# Patient Record
Sex: Male | Born: 1960 | Race: White | Hispanic: No | Marital: Married | Smoking: Never smoker
Health system: Southern US, Community
[De-identification: ages and names within clinical notes are randomized; demographics above are authoritative.]

## PROBLEM LIST (undated history)

## (undated) DIAGNOSIS — K219 Gastro-esophageal reflux disease without esophagitis: Secondary | ICD-10-CM

## (undated) DIAGNOSIS — H53021 Refractive amblyopia, right eye: Secondary | ICD-10-CM

## (undated) DIAGNOSIS — E039 Hypothyroidism, unspecified: Secondary | ICD-10-CM

## (undated) DIAGNOSIS — G629 Polyneuropathy, unspecified: Secondary | ICD-10-CM

## (undated) DIAGNOSIS — I10 Essential (primary) hypertension: Secondary | ICD-10-CM

## (undated) DIAGNOSIS — M797 Fibromyalgia: Secondary | ICD-10-CM

## (undated) DIAGNOSIS — E669 Obesity, unspecified: Secondary | ICD-10-CM

## (undated) DIAGNOSIS — F419 Anxiety disorder, unspecified: Secondary | ICD-10-CM

## (undated) DIAGNOSIS — M1990 Unspecified osteoarthritis, unspecified site: Secondary | ICD-10-CM

## (undated) DIAGNOSIS — E119 Type 2 diabetes mellitus without complications: Secondary | ICD-10-CM

## (undated) DIAGNOSIS — K222 Esophageal obstruction: Secondary | ICD-10-CM

## (undated) DIAGNOSIS — N400 Enlarged prostate without lower urinary tract symptoms: Secondary | ICD-10-CM

## (undated) DIAGNOSIS — F32A Depression, unspecified: Secondary | ICD-10-CM

## (undated) DIAGNOSIS — G4733 Obstructive sleep apnea (adult) (pediatric): Secondary | ICD-10-CM

## (undated) DIAGNOSIS — E1140 Type 2 diabetes mellitus with diabetic neuropathy, unspecified: Secondary | ICD-10-CM

## (undated) DIAGNOSIS — E11319 Type 2 diabetes mellitus with unspecified diabetic retinopathy without macular edema: Secondary | ICD-10-CM

## (undated) DIAGNOSIS — R251 Tremor, unspecified: Secondary | ICD-10-CM

## (undated) DIAGNOSIS — Z87442 Personal history of urinary calculi: Secondary | ICD-10-CM

## (undated) DIAGNOSIS — H2513 Age-related nuclear cataract, bilateral: Secondary | ICD-10-CM

## (undated) DIAGNOSIS — C439 Malignant melanoma of skin, unspecified: Secondary | ICD-10-CM

## (undated) DIAGNOSIS — E785 Hyperlipidemia, unspecified: Secondary | ICD-10-CM

## (undated) HISTORY — DX: Gastro-esophageal reflux disease without esophagitis: K21.9

## (undated) HISTORY — DX: Malignant melanoma of skin, unspecified: C43.9

## (undated) HISTORY — DX: Type 2 diabetes mellitus with unspecified diabetic retinopathy without macular edema: E11.319

## (undated) HISTORY — PX: TRIGGER FINGER RELEASE: SHX641

## (undated) HISTORY — DX: Essential (primary) hypertension: I10

## (undated) HISTORY — DX: Obstructive sleep apnea (adult) (pediatric): G47.33

## (undated) HISTORY — DX: Hyperlipidemia, unspecified: E78.5

## (undated) HISTORY — DX: Type 2 diabetes mellitus without complications: E11.9

## (undated) HISTORY — PX: ROTATOR CUFF REPAIR: SHX139

## (undated) HISTORY — DX: Obesity, unspecified: E66.9

## (undated) HISTORY — DX: Benign prostatic hyperplasia without lower urinary tract symptoms: N40.0

## (undated) HISTORY — DX: Type 2 diabetes mellitus with diabetic neuropathy, unspecified: E11.40

## (undated) HISTORY — DX: Esophageal obstruction: K22.2

## (undated) HISTORY — DX: Refractive amblyopia, right eye: H53.021

## (undated) HISTORY — DX: Hypothyroidism, unspecified: E03.9

## (undated) HISTORY — DX: Age-related nuclear cataract, bilateral: H25.13

## (undated) HISTORY — DX: Polyneuropathy, unspecified: G62.9

## (undated) HISTORY — DX: Tremor, unspecified: R25.1

---

## 1998-11-13 ENCOUNTER — Ambulatory Visit (HOSPITAL_BASED_OUTPATIENT_CLINIC_OR_DEPARTMENT_OTHER): Admission: RE | Admit: 1998-11-13 | Discharge: 1998-11-13 | Payer: Self-pay | Admitting: Orthopedic Surgery

## 1999-08-29 ENCOUNTER — Ambulatory Visit (HOSPITAL_COMMUNITY): Admission: RE | Admit: 1999-08-29 | Discharge: 1999-08-29 | Payer: Self-pay | Admitting: Neurosurgery

## 1999-08-29 ENCOUNTER — Encounter: Payer: Self-pay | Admitting: Neurosurgery

## 2000-12-26 HISTORY — PX: CARPAL TUNNEL RELEASE: SHX101

## 2000-12-26 HISTORY — PX: CYST EXCISION: SHX5701

## 2001-01-04 ENCOUNTER — Ambulatory Visit (HOSPITAL_BASED_OUTPATIENT_CLINIC_OR_DEPARTMENT_OTHER): Admission: RE | Admit: 2001-01-04 | Discharge: 2001-01-04 | Payer: Self-pay | Admitting: Orthopedic Surgery

## 2001-11-27 ENCOUNTER — Ambulatory Visit (HOSPITAL_BASED_OUTPATIENT_CLINIC_OR_DEPARTMENT_OTHER): Admission: RE | Admit: 2001-11-27 | Discharge: 2001-11-27 | Payer: Self-pay | Admitting: Orthopedic Surgery

## 2001-11-27 ENCOUNTER — Encounter (INDEPENDENT_AMBULATORY_CARE_PROVIDER_SITE_OTHER): Payer: Self-pay | Admitting: Specialist

## 2003-12-27 HISTORY — PX: CERVICAL DISC SURGERY: SHX588

## 2004-03-02 ENCOUNTER — Inpatient Hospital Stay (HOSPITAL_COMMUNITY): Admission: RE | Admit: 2004-03-02 | Discharge: 2004-03-03 | Payer: Self-pay | Admitting: Neurosurgery

## 2005-12-26 HISTORY — PX: CYST EXCISION: SHX5701

## 2006-11-23 ENCOUNTER — Ambulatory Visit (HOSPITAL_BASED_OUTPATIENT_CLINIC_OR_DEPARTMENT_OTHER): Admission: RE | Admit: 2006-11-23 | Discharge: 2006-11-23 | Payer: Self-pay | Admitting: General Surgery

## 2006-11-23 ENCOUNTER — Encounter (INDEPENDENT_AMBULATORY_CARE_PROVIDER_SITE_OTHER): Payer: Self-pay | Admitting: Specialist

## 2008-05-01 ENCOUNTER — Ambulatory Visit: Payer: Self-pay | Admitting: Internal Medicine

## 2008-05-09 ENCOUNTER — Encounter: Payer: Self-pay | Admitting: Internal Medicine

## 2008-05-09 ENCOUNTER — Ambulatory Visit: Payer: Self-pay

## 2008-05-15 DIAGNOSIS — E785 Hyperlipidemia, unspecified: Secondary | ICD-10-CM | POA: Insufficient documentation

## 2008-05-15 DIAGNOSIS — F411 Generalized anxiety disorder: Secondary | ICD-10-CM | POA: Insufficient documentation

## 2008-05-15 DIAGNOSIS — K219 Gastro-esophageal reflux disease without esophagitis: Secondary | ICD-10-CM | POA: Insufficient documentation

## 2008-05-15 DIAGNOSIS — I10 Essential (primary) hypertension: Secondary | ICD-10-CM | POA: Insufficient documentation

## 2008-05-15 DIAGNOSIS — E109 Type 1 diabetes mellitus without complications: Secondary | ICD-10-CM | POA: Insufficient documentation

## 2008-05-15 DIAGNOSIS — E669 Obesity, unspecified: Secondary | ICD-10-CM

## 2008-05-15 DIAGNOSIS — F329 Major depressive disorder, single episode, unspecified: Secondary | ICD-10-CM

## 2008-05-15 DIAGNOSIS — F32A Depression, unspecified: Secondary | ICD-10-CM | POA: Insufficient documentation

## 2008-05-16 ENCOUNTER — Ambulatory Visit: Payer: Self-pay | Admitting: Pulmonary Disease

## 2008-05-16 DIAGNOSIS — G4733 Obstructive sleep apnea (adult) (pediatric): Secondary | ICD-10-CM

## 2008-05-21 ENCOUNTER — Ambulatory Visit: Payer: Self-pay | Admitting: Internal Medicine

## 2008-05-26 ENCOUNTER — Ambulatory Visit (HOSPITAL_BASED_OUTPATIENT_CLINIC_OR_DEPARTMENT_OTHER): Admission: RE | Admit: 2008-05-26 | Discharge: 2008-05-26 | Payer: Self-pay | Admitting: Pulmonary Disease

## 2008-05-26 ENCOUNTER — Encounter: Payer: Self-pay | Admitting: Pulmonary Disease

## 2008-06-16 ENCOUNTER — Ambulatory Visit: Payer: Self-pay | Admitting: Pulmonary Disease

## 2008-06-23 ENCOUNTER — Telehealth (INDEPENDENT_AMBULATORY_CARE_PROVIDER_SITE_OTHER): Payer: Self-pay | Admitting: *Deleted

## 2008-11-07 ENCOUNTER — Ambulatory Visit: Payer: Self-pay | Admitting: Occupational Medicine

## 2009-12-14 ENCOUNTER — Ambulatory Visit: Payer: Self-pay | Admitting: Pulmonary Disease

## 2010-03-03 ENCOUNTER — Telehealth: Payer: Self-pay | Admitting: Pulmonary Disease

## 2010-03-08 ENCOUNTER — Encounter: Payer: Self-pay | Admitting: Pulmonary Disease

## 2010-08-16 ENCOUNTER — Telehealth (INDEPENDENT_AMBULATORY_CARE_PROVIDER_SITE_OTHER): Payer: Self-pay | Admitting: *Deleted

## 2010-08-23 ENCOUNTER — Telehealth (INDEPENDENT_AMBULATORY_CARE_PROVIDER_SITE_OTHER): Payer: Self-pay | Admitting: *Deleted

## 2010-12-14 ENCOUNTER — Ambulatory Visit: Payer: Self-pay | Admitting: Thoracic Surgery

## 2010-12-15 ENCOUNTER — Ambulatory Visit: Payer: Self-pay | Admitting: Pulmonary Disease

## 2010-12-17 ENCOUNTER — Ambulatory Visit (HOSPITAL_COMMUNITY)
Admission: RE | Admit: 2010-12-17 | Discharge: 2010-12-17 | Payer: Self-pay | Source: Home / Self Care | Attending: Thoracic Surgery | Admitting: Thoracic Surgery

## 2010-12-17 ENCOUNTER — Encounter: Payer: Self-pay | Admitting: Pulmonary Disease

## 2010-12-23 ENCOUNTER — Ambulatory Visit: Payer: Self-pay | Admitting: Thoracic Surgery

## 2010-12-30 DIAGNOSIS — D869 Sarcoidosis, unspecified: Secondary | ICD-10-CM | POA: Insufficient documentation

## 2010-12-30 DIAGNOSIS — R05 Cough: Secondary | ICD-10-CM | POA: Insufficient documentation

## 2011-01-06 ENCOUNTER — Ambulatory Visit
Admission: RE | Admit: 2011-01-06 | Discharge: 2011-01-06 | Payer: Self-pay | Source: Home / Self Care | Attending: Pulmonary Disease | Admitting: Pulmonary Disease

## 2011-01-06 ENCOUNTER — Encounter: Payer: Self-pay | Admitting: Pulmonary Disease

## 2011-01-12 ENCOUNTER — Ambulatory Visit
Admission: RE | Admit: 2011-01-12 | Discharge: 2011-01-12 | Payer: Self-pay | Source: Home / Self Care | Attending: Pulmonary Disease | Admitting: Pulmonary Disease

## 2011-01-16 ENCOUNTER — Encounter: Payer: Self-pay | Admitting: Neurosurgery

## 2011-01-25 NOTE — Progress Notes (Signed)
  Phone Note Other Incoming   Request: Send information Summary of Call: Request for records received from [facility]. Request forwarded to [employer].     

## 2011-01-25 NOTE — Letter (Signed)
Summary: [facility] [employer] [facility] Pul[facility]  [address]   [place], [place] [address]   Phone: [phone]  Fax: [phone]    03/08/2010  [facility] [address] [place], [place]  [address]  Dear Mr. Staley,    We have been attempting to contact you. Please call our office at your earliest convenience. Thank you.        Sincerely,   Marcelyn Bruins MD

## 2011-01-25 NOTE — Progress Notes (Signed)
Summary: unable to obtain CPAP Download  Phone Note Other Incoming Call back at [phone]   Caller: DeVona Tart Summary of Call: [facility]  would like Dr. Shelle Iron to know that after all the phone calls and letters sent to the patient Willie [name]  DOB  March 14, 1961  thsy still have not been able to obtain a download of pt's CPAP machine.  Initial call taken by: Denna Haggard, CMA,  March 03, 2010 8:57 AM  Follow-up for Phone Call        LMOVMTCB. Michel Bickers [facility]  March 03, 2010 9:16 AM LMTCB with pt.  Carron Curie CMA  March 04, 2010 12:27 PM LMTCBx3.Carron Curie CMA  March 05, 2010 2:24 PM  FYI we have also attempted to call pt several times. I will sedn a letter advising pt so contact our office as well. Carron Curie CMA  March 08, 2010 9:54 AM   Additional Follow-up for Phone Call Additional follow up Details #1::        send a letter asking him to call dme to get download. Additional Follow-up by: Barbaraann Share MD,  March 08, 2010 4:48 PM    Additional Follow-up for Phone Call Additional follow up Details #2::    letter already mailed asking pt to contact office, will notify needs to contact DME for download once he calls the office. Carron Curie CMA  March 08, 2010 4:50 PM

## 2011-01-27 NOTE — Miscellaneous (Signed)
Summary: Orders Update pft charges  Clinical Lists Changes  Orders: Added new Service order of Carbon Monoxide diffusing w/capacity [phone]) - Signed Added new Service order of Lung Volumes ([address]) - Signed Added new Service order of Spirometry (Pre & Post) [phone]) - Signed  Appended Document: Orders Update pft charges pt needs ov with me to review breathing studies.  Appended Document: Orders Update pft charges called and spoke with pt.  pt scheduled to see KC 01-12-2011 at 1:30 pm

## 2011-01-27 NOTE — Op Note (Signed)
Summary: Bronchoscopy/[place]  Bronchoscopy/[place]   Imported By: Sherian Rein 01/17/2011 11:09:03  _____________________________________________________________________  External Attachment:    Type:   Image     Comment:   External Document

## 2011-01-27 NOTE — Assessment & Plan Note (Signed)
Summary: consult for sarcoidosis    Copy to:  [facility] Primary Provider/Referring Provider:  Dr. Laurene Footman  CC:  Pulmonary Consult. Dx with Sarcoidosis.  Cough with thick white sputum and tightness in chest.  .  History of Present Illness: The pt is a 50y/o male who I have been asked to see for the new diagnosis of sarcoidosis.  The pt had an unusual rxn to the flu shot with ansarca, joint pain (knees), and malaise, and was found on cxr then ct chest to have mediastinal LN.  He underwent EBUS where a FNA of his 7R and 10R nodes showed noncaseating granulomas.  All other specimens, including cultures were negative.  The pt was started on prednisone 12/18/10 for wheezing and sob, and thinks this did help some.  He currently has a cough 6/10 that is primarily dry with minimal "rubbery" mucus.  He has definite hoarseness, and notes worsening GERD symptoms (his wife has noticed him drinking maalox lately).  He also describes 1-2 block doe at moderate pace on flat ground, and will get sob bringing groceries in from the car.  He had no issues with cough or sob prior to all of this occurring.    Current Medications (verified): 1)  Novolog 100 Unit/ml  Soln (Insulin Aspart) .... Use As Directed 2)  Hydrochlorothiazide 25 Mg  Tabs (Hydrochlorothiazide) .... Take 1 Tablet By Mouth Once A Day 3)  Atenolol 50 Mg Tabs (Atenolol) .... Take 2 Tabs By Mouth Once Daily 4)  Amitriptyline Hcl 50 Mg  Tabs (Amitriptyline Hcl) .... Take 1 Tablet By Mouth Once A Day 5)  Cyclobenzaprine Hcl 10 Mg  Tabs (Cyclobenzaprine Hcl) .... Take 2 Tabs By Mouth Once Daily 6)  Buspirone Hcl 10 Mg  Tabs (Buspirone Hcl) .... Take 1 Tablet By Mouth Two Times A Day 7)  Synthroid 150 Mcg Tabs (Levothyroxine Sodium) .... Take 1 Tablet By Mouth Once A Day 8)  Cialis .... Take By Mouth As Needed 9)  Fenofibrate 160 Mg Tabs (Fenofibrate) .... Take 1 Tablet By Mouth Once A Day 10)  Tramadol Hcl 50 Mg Tabs (Tramadol Hcl) .... Take 1 Tablet By  Mouth Once A Day As Needed 11)  Lyrica 50 Mg Caps (Pregabalin) .... Take 1 Tablet By Mouth Two Times A Day 12)  Lasix 40 Mg Tabs (Furosemide) .... Take 1 Tablet By Mouth Once A Day 13)  Wellbutrin Sr 150 Mg Xr12h-Tab (Bupropion Hcl) .... Take 1 Tablet By Mouth Once A Day 14)  Ventolin Hfa 108 (90 Base) Mcg/act  Aers (Albuterol Sulfate) .Marland Kitchen.. 1-2 Puffs Every 4-6 Hours As Needed 15)  Maalox 600 Mg Chew (Calcium Carbonate Antacid) .... As Needed 16)  Tussin 100 Mg/36ml Syrp (Guaifenesin) .... Use As Needed 17)  Methocarbamol 750 Mg Tabs (Methocarbamol) .... 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 DEPRESSION (ICD-311) ANXIETY (ICD-300.00) OBESITY (ICD-278.00) G E R D (ICD-530.81) HYPERLIPIDEMIA (ICD-272.4) HYPERTENSION (ICD-401.9) diabetes diabetic neuropathy tachycardia of unknown type. OSA--npsg 2009 with AHI 39/hr...on cpap.  Past Surgical History: status post cervical spine surgery Bilat. carpal tunnel release cyst removed from leg trigger finger surgery  Family History: Reviewed history from 05/16/2008 and no changes required. Father - emphysema  Social History: Reviewed history from 05/16/2008 and no changes required. Married with children Encarnacion Slates  never smoked  Review of Systems  The patient complains of shortness of breath with activity, shortness of breath at rest, productive cough, non-productive cough, acid heartburn, indigestion, weight change, difficulty swallowing, tooth/dental problems, sneezing, anxiety, depression, hand/feet swelling, joint stiffness or pain, and fever.  The patient denies coughing up blood, chest pain, irregular heartbeats, loss of appetite, abdominal pain, sore throat, headaches, nasal congestion/difficulty breathing through nose, itching, ear ache, rash, and change in color of  mucus.    Vital Signs:  Patient profile:   50 year old male Height:      68 inches Weight:      215 pounds BMI:     32.81 O2 Sat:      91 % on Room air Temp:     98.3 degrees F oral Pulse rate:   111 / minute BP sitting:   116 / 80  (right arm) Cuff size:   large  Vitals Entered By: Arman Filter LPN (December 30, 2010 3:35 PM)  O2 Flow:  Room air CC: Pulmonary Consult. Dx with Sarcoidosis.  Cough with thick white sputum and tightness in chest.   Comments Medications reviewed with patient Arman Filter LPN  December 30, 2010 3:42 PM    Physical Exam  General:  ow male in nad Eyes:  PERRLA and EOMI.   Nose:  patent without discharge Mouth:  no lesions or exudates +elongation of soft palate and uvula Neck:  no jvd, tmg, LN Lungs:  clear to auscultation no wheezing or rhonchi Heart:  rrr, no mrg Abdomen:  soft and nontender, bs+ Extremities:  1+ ankle edema, no cyanosis  pulses intact distally Neurologic:  alert and oriented, moves all 4.   Impression & Recommendations:  Problem # 1:  PULMONARY SARCOIDOSIS (ICD-135) the pt has been diagnosed with sarcoidosis by [facility] via EBUS.  He does not have any airspace disease by xray, but does have a few tiny nodular densities.  I have explained to the pt that we reserve treatment with steroids for end organ damage, and evidence for progressive symptoms.  It is unclear whether any of his current symptoms have anything to do with his sarcoidosis.  I would like to get full pfts to see if he has airway involvement with sarcoid, and to establish a baseline.  I would like to avoid steroid treatment if at all possible.    Problem # 2:  COUGH (ICD-786.2) His cough is primarily dry, and I wonder if it is due to reflux disease.  This seems to be a big issue for him, and will therefore treat with PPI.  Pft's will be key to see if he has airway involvement with his sarcoid, which usually responds nicely to ICS alone rather than requiring systemic  steroids.    Problem # 3:  OBSTRUCTIVE SLEEP APNEA (ICD-327.23) pt has not been compliant with followup visits, but tells me he has been wearing cpap religiously.  He is in need of supplies, hose, mask, etc.  will send an order to his dme.  Medications Added to Medication List This Visit: 1)  Atenolol 50 Mg Tabs (Atenolol) .... Take 2 tabs by mouth once daily 2)  Buspirone Hcl 10 Mg Tabs (Buspirone hcl) .... Take 1 tablet by mouth two times a day 3)  Synthroid 150 Mcg Tabs (Levothyroxine sodium) .... Take 1 tablet by mouth once a day 4)  Tramadol Hcl 50 Mg Tabs (Tramadol hcl) .... Take 1 tablet by mouth once a day as needed 5)  Lyrica 50 Mg Caps (Pregabalin) .Marland KitchenMarland KitchenMarland Kitchen  Take 1 tablet by mouth two times a day 6)  Lasix 40 Mg Tabs (Furosemide) .... Take 1 tablet by mouth once a day 7)  Wellbutrin Sr 150 Mg Xr12h-tab (Bupropion hcl) .... Take 1 tablet by mouth once a day 8)  Ventolin Hfa 108 (90 Base) Mcg/act Aers (Albuterol sulfate) .Marland Kitchen.. 1-2 puffs every 4-6 hours as needed 9)  Maalox 600 Mg Chew (Calcium carbonate antacid) .... As needed 10)  Tussin 100 Mg/52ml Syrp (Guaifenesin) .... Use as needed 11)  Methocarbamol 750 Mg Tabs (Methocarbamol) .... Take 1 tablet by mouth once a day 12)  Tessalon Perles 100 Mg Caps (Benzonatate) .... One to two every 6 hrs if needed. 13)  Omeprazole 40 Mg Cpdr (Omeprazole) .... One each am  Other Orders: Consultation Level V (45409) DME Referral (DME) Pulmonary Referral (Pulmonary)  Patient Instructions: 1)  will schedule for breathing test to evaluate current status of your lungs.  will call when results available 2)  will give you a prescription for tessalon pearls, but also omeprazole 40mg  one each am for your reflux 3)  will arrange followup with me once the breathing studies are reviewed.   Prescriptions: OMEPRAZOLE 40 MG CPDR (OMEPRAZOLE) one each am  #30 x 6   Entered and Authorized by:   Barbaraann Share MD   Signed by:   Barbaraann Share MD on  12/30/2010   Method used:   Print then Give to Patient   RxID:   [id number] TESSALON PERLES 100 MG  CAPS (BENZONATATE) one to two every 6 hrs if needed.  #30 x 2   Entered and Authorized by:   Barbaraann Share MD   Signed by:   Barbaraann Share MD on 12/30/2010   Method used:   Print then Give to Patient   RxID:   [id number]    Immunization History:  Influenza Immunization History:    Influenza:  historical (11/25/2010)

## 2011-02-02 NOTE — Assessment & Plan Note (Signed)
Summary: rov for discussion of pfts   Copy to:  [facility] Primary Provider/Referring Provider:  Dr. Laurene Footman  CC:  ov to discuss PFT results.  states cough has imporved 30% to 40%.  Pt states he coughs up "tissue" occ.  Marland Kitchen  History of Present Illness: the pt comes in today for f/u of his pfts, as part of a w/u for sarcoidosis and cough.  He was found to have no airflow obstruction, mild to moderate restriction that I think is due to his centripetal obesity, and a normal DLCO.   I have reviewed the study with him in detail, and answered all of his questions.  At the last visit, a lot of his cough was felt due to GERD, and was started on PPI and as needed tessalon pearls.  He feels his cough is 30-40% better from last visit, and  I have explained it usually takes 8 weeks to see resolution of cough from GERD.  Current Medications (verified): 1)  Novolog 100 Unit/ml  Soln (Insulin Aspart) .... Use As Directed 2)  Hydrochlorothiazide 25 Mg  Tabs (Hydrochlorothiazide) .... Take 1 Tablet By Mouth Once A Day 3)  Atenolol 50 Mg Tabs (Atenolol) .... Take 2 Tabs By Mouth Once Daily 4)  Amitriptyline Hcl 50 Mg  Tabs (Amitriptyline Hcl) .... Take 1 Tablet By Mouth Once A Day 5)  Cyclobenzaprine Hcl 10 Mg  Tabs (Cyclobenzaprine Hcl) .... Take 2 Tabs By Mouth Once Daily 6)  Buspirone Hcl 10 Mg  Tabs (Buspirone Hcl) .... Take 1 Tablet By Mouth Two Times A Day 7)  Synthroid 150 Mcg Tabs (Levothyroxine Sodium) .... Take 1 Tablet By Mouth Once A Day 8)  Cialis .... Take By Mouth As Needed 9)  Tramadol Hcl 50 Mg Tabs (Tramadol Hcl) .... Take 1 Tablet By Mouth Once A Day As Needed 10)  Lyrica 50 Mg Caps (Pregabalin) .... Take 1 Tablet By Mouth Two Times A Day 11)  Lasix 40 Mg Tabs (Furosemide) .... Take 1 Tablet By Mouth Once A Day 12)  Wellbutrin Sr 150 Mg Xr12h-Tab (Bupropion Hcl) .... Take 1 Tablet By Mouth Once A Day 13)  Ventolin Hfa 108 (90 Base) Mcg/act  Aers (Albuterol Sulfate) .Marland Kitchen.. 1-2 Puffs Every 4-6  Hours As Needed 14)  Maalox 600 Mg Chew (Calcium Carbonate Antacid) .... As Needed 15)  Tussin 100 Mg/9ml Syrp (Guaifenesin) .... Use As Needed 16)  Methocarbamol 750 Mg Tabs (Methocarbamol) .... Take 1 Tablet By Mouth Once A Day 17)  Tessalon Perles 100 Mg  Caps (Benzonatate) .... One To Two Every 6 Hrs If Needed. 18)  Omeprazole 40 Mg Cpdr (Omeprazole) .... One Each Am  Allergies (verified): No Known Drug Allergies  Review of Systems       The patient complains of shortness of breath with activity, shortness of breath at rest, productive cough, non-productive cough, irregular heartbeats, loss of appetite, difficulty swallowing, tooth/dental problems, nasal congestion/difficulty breathing through nose, sneezing, itching, anxiety, depression, and hand/feet swelling.  The patient denies coughing up blood, chest pain, acid heartburn, indigestion, weight change, abdominal pain, sore throat, headaches, ear ache, joint stiffness or pain, rash, change in color of mucus, and fever.    Vital Signs:  Patient profile:   50 year old male Height:      68 inches Weight:      219 pounds BMI:     33.42 O2 Sat:      92 % on Room air Temp:     98.2  degrees F oral Pulse rate:   98 / minute BP sitting:   112 / 80  (left arm) Cuff size:   large  Vitals Entered By: Arman Filter LPN (January 12, 2011 1:35 PM)  O2 Flow:  Room air CC: ov to discuss PFT results.  states cough has imporved 30% to 40%.  Pt states he coughs up "tissue" occ.   Comments Medications reviewed with patient Arman Filter LPN  January 12, 2011 1:38 PM    Physical Exam  General:  89 male in nad +centripetal obesity Nose:  no purulence or discharge noted. Lungs:  clear to auscultation Heart:  rrr Extremities:  1-2+ edema, no cyanosis  Neurologic:  alert and oriented, moves all 4.   Impression & Recommendations:  Problem # 1:  PULMONARY SARCOIDOSIS (ICD-135) the pt has no airflow obstruction or DLCO abnormalities on his  pfts, but did have restriction that I believe is due to his centripetal obesity given he has no ISLD on his ct chest.  I have had a long discussion with he and his wife about the treatment of sarcoid, and have explained we only treat with chronic prednisone if he has ogoing symptoms attributable to his sarcoid, or if there is evidence for end-organ damage.  His pfts are really unremarkable, and the pt does not have any significant pulmonary symptoms except doe (I think is due to weight and conditioning) and cough (which I think is upper airway and due to GERD).  I would really like to avoid chronic prednisone in this pt with significant DM and obesity.  I really think the risks far outweigh the benefits at this time.  Problem # 2:  COUGH (ICD-786.2) I suspect this is due to GERD, and will continue treating him aggressively.  If cough continues despite this, may give him a trial of ICS to see if helps.  Medications Added to Medication List This Visit: 1)  Omeprazole 40 Mg Cpdr (Omeprazole) .... One in am and pm  Other Orders: Est. Patient Level III ([address])  Patient Instructions: 1)  increase omeprazole to 40mg  in am AND pm for next 8 weeks.  Please call me in next 6 weeks to let me know how your cough is doing. 2)  no menthol or peppermint....use hard candy to bathe back of throat to help soothe tickle. 3)  will send a note to Dr. Evlyn Kanner to consider an echocardiogram for your swelling.   4)  stop ventolin 5)  will make a followup appt for you after I hear back about your progress.   Prescriptions: OMEPRAZOLE 40 MG CPDR (OMEPRAZOLE) one in am and pm  #60 x 2   Entered and Authorized by:   Barbaraann Share MD   Signed by:   Barbaraann Share MD on 01/12/2011   Method used:   Print then Give to Patient   RxID:   [id number]

## 2011-02-08 NOTE — Letter (Signed)
December 23, 2010  Jeannett Senior A. Saint Martin, MD [address] [place], [place]  [address]  Re:  Willie [name], Willie [name]                DOB:  28-Oct-1961  Dear Dr. Evlyn Kanner,  The patient returned today after his fiberoptic bronchoscopy with endobronchial ultrasounds.  All of his tests show noncaseating granulomas from his needle aspirations, so I think, he has sarcoid and I will refer him back to you to decide about treatment for his sarcoid. He went to Urgent Care on Christmas Day, he was placed on Tessalon Perles and prednisone.  His blood pressure was 125/76, pulse 94, respirations 16, and sats were 94%.  I will be happy to see him again if he has any future problems.  Ines Bloomer, M.D. Electronically Signed  DPB/MEDQ  D:  12/23/2010  T:  12/24/2010  Job:  [id number]

## 2011-03-02 ENCOUNTER — Telehealth (INDEPENDENT_AMBULATORY_CARE_PROVIDER_SITE_OTHER): Payer: Self-pay | Admitting: *Deleted

## 2011-03-07 LAB — COMPREHENSIVE METABOLIC PANEL
Albumin: 3.7 g/dL (ref 3.5–5.2)
Alkaline Phosphatase: 95 U/L (ref 39–117)
BUN: 12 mg/dL (ref 6–23)
Chloride: 90 mEq/L — ABNORMAL LOW (ref 96–112)
Creatinine, Ser: 1.05 mg/dL (ref 0.4–1.5)
Glucose, Bld: 273 mg/dL — ABNORMAL HIGH (ref 70–99)
Potassium: 3.2 mEq/L — ABNORMAL LOW (ref 3.5–5.1)
Total Bilirubin: 0.5 mg/dL (ref 0.3–1.2)
Total Protein: 7.8 g/dL (ref 6.0–8.3)

## 2011-03-07 LAB — CULTURE, RESPIRATORY W GRAM STAIN

## 2011-03-07 LAB — PROTIME-INR
INR: 0.92 (ref 0.00–1.49)
Prothrombin Time: 12.6 seconds (ref 11.6–15.2)

## 2011-03-07 LAB — FUNGUS CULTURE W SMEAR

## 2011-03-07 LAB — CBC
HCT: 45.7 % (ref 39.0–52.0)
MCH: 30.2 pg (ref 26.0–34.0)
MCV: 86.2 fL (ref 78.0–100.0)
Platelets: 347 10*3/uL (ref 150–400)
RDW: 12.6 % (ref 11.5–15.5)
WBC: 7.9 10*3/uL (ref 4.0–10.5)

## 2011-03-07 LAB — AFB CULTURE WITH SMEAR (NOT AT ARMC): Acid Fast Smear: NONE SEEN

## 2011-03-07 LAB — GLUCOSE, CAPILLARY: Glucose-Capillary: 197 mg/dL — ABNORMAL HIGH (ref 70–99)

## 2011-03-07 LAB — APTT: aPTT: 31 seconds (ref 24–37)

## 2011-03-07 LAB — ABO/RH: ABO/RH(D): A NEG

## 2011-03-08 NOTE — Progress Notes (Signed)
Summary: update   Phone Note Call from Patient   Caller: Patient Call For: [facility] Summary of Call: Patient phoned stated that Dr Greggory Keen changed some of his medication at the last visit and stated that Dr Shelle Iron wanted hime to advise him of how things were going. Per Mr. Puig things are going great he is much improved. He can be reached at [phone] Initial call taken by: Vedia Coffer,  March 02, 2011 2:39 PM  Follow-up for Phone Call        ATC # provided above - no answer and unable to leave message.  WCB Crystal Jones RN  March 02, 2011 3:05 PM  Per last OV note from 01/12/11, pt to increase omeprazole to 40mg  in am AND pm for next 8 weeks.  Please call in next 6 weeks to let me know how your cough is doing.  Called, spoke with pt.  States he is "much improved."  Cough has resolved.  Denies SOB, whezing, chest tightness.  Advised I would send message to [facility] so he is aware. Follow-up by: Gweneth Dimitri RN,  March 03, 2011 8:57 AM  Additional Follow-up for Phone Call Additional follow up Details #1::        good.  he needs to stay on his PPI as directed. Additional Follow-up by: Barbaraann Share MD,  March 03, 2011 1:01 PM    Additional Follow-up for Phone Call Additional follow up Details #2::    Called, spoke with pt.  He is aware of above statement per [facility] and verbalized understanding. Follow-up by: Gweneth Dimitri RN,  March 03, 2011 2:04 PM

## 2011-04-21 DIAGNOSIS — Z0271 Encounter for disability determination: Secondary | ICD-10-CM

## 2011-04-29 ENCOUNTER — Other Ambulatory Visit: Payer: Self-pay | Admitting: Pulmonary Disease

## 2011-05-10 NOTE — Assessment & Plan Note (Signed)
[facility]                            CARDIOLOGY OFFICE NOTE   NAME:[name], [name]                       MRN:          [id number]  DATE:05/21/2008                            DOB:          05-11-1961    PRIMARY CARDIOLOGIST:  Bevelyn Buckles. Bensimhon, MD   PRIMARY CARE Willie [name]:  Willie Mater. Willie [name], M.D.   PATIENT PROFILE:  50 year old Caucasian male with history of  elevated resting heart rate who presents for followup.   PROBLEM LIST:  1. History of elevated resting heart rate.      a.     May 09, 2008 exercise Myoview:  Exercise time 10 minutes.       Maximum heart rate 162 beats per minute.  13.7 METS achieved.  ECG       without acute changes.  EF 61%, normal perfusion.      b.     May 15. 2009. 2D echocardiogram:  EF 55-60% without regional       wall motion abnormalities.  No significant valvular abnormalities.  2. Hypertension.  3. Hyperlipidemia.  4. Type 1 diabetes mellitus.  5. Obesity.  6. GERD.  7. Anxiety and depression.  8. Hypothyroidism.   HISTORY OF PRESENT ILLNESS:  50 year old Caucasian male with the  above problem list.  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  He was otherwise asymptomatic.  Since then he has  undergone exercise stress testing as well as echocardiogram, both of  which were normal.  He has been feeling well, has had no symptoms or  limitations in his activities.   HOME MEDICATIONS:  1. NovoLog pump.  2. Hydrochlorothiazide 25 mg daily.  3. Atenolol 150 mg daily.  4. Amitriptyline 50 mg highly.  5. Cyclobenzaprine 10 mg daily.  6. Synthroid 125 mcg daily.  7. Vytorin 10/40 mg daily.  8. Budeprion 300 mg daily.  9. Buspirone 20 mg daily.   PHYSICAL EXAMINATION:  Blood pressure 120/70, heart rate 90,  respirations 16.  His weight is 213 pounds.  Pleasant, white male in no  acute distress.  Awake,  alert and oriented x3.  HEENT:  Normal.  NEURO:  Grossly intact, nonfocal.  SKIN:  Warm and dry without lesions or masses.  NECK:  No bruits or JVD.  LUNGS:  Respiration is regular, unlabored.  CARDIAC:  Regular, S1, S2.  No S3, S4 or murmurs.  ABDOMEN:  Round, soft, nontender.  Normal bowel sounds.  EXTREMITIES:  Warm, dry, pink.  No clubbing, cyanosis or edema.  Dorsalis pedis, posterior tibial pulses 2+ bilaterally.   ACCESSORY CLINICAL FINDINGS:  Testing is outlined in the problem list.   ASSESSMENT/PLAN:  1. Resting heart rate elevation, asymptomatic.  He is status post echo      and Myoview, which were normal.  He is on beta blocker therapy with      good blood pressure control.  He also  has a history of  hyperthyroidism, but tells [place] his TSH is regularly checked by Dr.      Evlyn [name], and has been normal.  No further recommendations at this      time from a cardiac standpoint.  2. Hypertension.  Stable and well controlled.  3. Hyperlipidemia.  Remain on statin therapy.  Followed by Dr. Evlyn [name].  4. Diabetes mellitus.  On insulin pump followed by Dr. Evlyn [name].   DISPOSITION:  Patient can follow up with Dr. Gala [name] as needed.      Willie [name], ANP  Electronically Signed      Bevelyn Buckles. Bensimhon, MD  Electronically Signed   CB/MedQ  DD: 05/21/2008  DT: 05/21/2008  Job #: [id number]

## 2011-05-10 NOTE — Assessment & Plan Note (Signed)
[facility]                            CARDIOLOGY OFFICE NOTE   NAME:Gawron, ANVITH MAURIELLO                       MRN:          [id number]  DATE:05/01/2008                            DOB:          [birth year]    REFERRING PHYSICIAN:  Tera Mater. Evlyn Kanner, M.D.   REASON FOR CONSULTATION:  Tachycardia.   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  He has a history of type 1 diabetes,  and he has been diabetic for 37 years.  He also has history of  hyperlipidemia and hypertension.  Over the last several years he has  noticed that his resting heart rate has been creeping up.  Initially it  was in the 90's, and now it often ranges between 110 and 115.  He had a  stress test as part of the firefighter's physical a few months ago, and  had only walked 3.5 minutes on the treadmill before his heart rate shot  up to 140 and it was stopped by the tech.  He did not have any chest  pain during this time.  He has also had an echocardiogram about 10 years  ago which he tells me was normal, but does not remember why it was  ordered.   Over the past few months he also notes that his exercise tolerance has  been somewhat diminished, and it is just harder for him to do as much as  he used to.  He feels more winded.  He also has bouts of esophageal  reflux that he says start at the top of his head and go to his jaw and  chest.  He takes Mylanta, and it resolved.  He denies any exertional  chest pain.   His wife does say that he snores heavily, and she often has to leave the  room.  He has had witnessed apnea for up to 30 seconds.   REVIEW OF SYSTEMS:  Notable for reflux disease, severe fatigue, sexual  dysfunction, thyroid disease, anxiety, depression, arthritis.  He has  had multiple hand surgeries and carpal tunnel surgeries.  Remainder of  Review of Systems is negative except for HPI and problem list.   PROBLEM  LIST:  1. Type 1 diabetes x37 years.  2. Hypertension.  3. Hyperlipidemia.  4. Obesity.  5. Gastroesophageal reflux disease.  6. Anxiety/depression.   CURRENT MEDICATIONS:  1. NovoLog insulin pump.  2. HCTZ 25 a day.  3. Atenolol 150 a day.  4. Amitriptyline 50 a day.  5. Cyclobenzaprine 10 a day.  6. Budeprion 300 a day.  7. Buspirone 20 a day.  8. Synthroid 125 mcg a day.  9. Vytorin 10/40.  10.Cialis 20 mg as needed.  11.Darvocet p.r.n.   ALLERGIES:  No known drug allergies.   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  He is  married with one child.  He does not smoke.  He does drink coffee, no  alcohol.   FAMILY HISTORY:  Mother is alive.  Father died at 73 due to  renal  failure and apparently had a cardiomegaly.  Brother is alive and well.   PHYSICAL EXAMINATION:  GENERAL:  He is no acute distress.  Ambulates  around the clinic without any respiratory difficulty.  VITAL SIGNS:  Blood pressure 122/84, heart rate 94, weight 213.  HEENT:  Normal.  LUNGS:  Airway Mallampati class III.  NECK:  Thick and supple.  No JVD.  Carotids are 2+ bilaterally without  bruits.  There is no lymphadenopathy or thyromegaly.  CARDIAC:  PMI is nondisplaced.  He is regular with S4, no obvious  murmur.  LUNGS:  Clear.  ABDOMEN:  Obese, nontender, nondistended.  There is no  hepatosplenomegaly, no bruits, no masses.  Good bowel sounds.  EXTREMITIES:  Warm with no cyanosis, clubbing or edema.  No rash.  NEUROLOGICAL:  Alert and oriented x3.  Cranial nerves II-XII are intact.  Moves all four extremities without difficulty.  Affect is pleasant.   EKG shows normal sinus rhythm at rate of 93 with a left posterior  fascicular block, no ST-T wave abnormalities.   ASSESSMENT/PLAN:  Tachycardia.  I am unsure of what the cause of this  is.  Certainly part of it could be deconditioning.  However, I do worry  that he may have severe obstructive sleep apnea, and this could be   playing a significant role.  We will send him for a sleep study.  He  also has a very high risk for coronary artery disease and related left  ventricle dysfunction as well as pulmonary hypertension.  Also in the  differential diagnosis could be chronic pulmonary emboli.  At this point  we will start with an echocardiogram to evaluate left ventricle function  and pulmonary pressures.  We will also order a treadmill Myoview to look  for ischemia and evaluate his functional capacity.  I will see him back  in 2-3 weeks to review all the testing with him.  If  there are any abnormalities on his cardiac testing I would have a very  low threshold to proceed with right and left heart catheterization.     Bevelyn Buckles. Bensimhon, MD  Electronically Signed    DRB/MedQ  DD: 05/01/2008  DT: 05/01/2008  Job #: [id number]   cc:   Jeannett Senior A. Evlyn Kanner, M.D.

## 2011-05-10 NOTE — Procedures (Signed)
NAMERUSH, SALCE NO.:  [id number]   MEDICAL RECORD NO.:  [id number]          PATIENT TYPE:  OUT   LOCATION:  SLEEP CENTER                 FACILITY[facility]  [facility]   PHYSICIAN:  Barbaraann Share, MD,FCCPDATE OF BIRTH:  Oct 27, 1962   DATE OF STUDY:  05/26/2008                            NOCTURNAL POLYSOMNOGRAM   REFERRING PHYSICIAN:  Barbaraann Share, MD,FCCP   INDICATION FOR STUDY:  Hypersomnia with sleep apnea.   EPWORTH SLEEPINESS SCORE:  Is 5.   MEDICATIONS:   SLEEP ARCHITECTURE:  The patient had a total sleep time of 307 minutes,  with no slow wave sleep or REM achieved.  There was significant sleep  fragmentation throughout.  Sleep onset latency was prolonged at 65  minutes, and sleep efficiency was poor at 74%.   RESPIRATORY DATA:  The patient was found to have 82 apneas and 117  hypopneas, for an apnea/hypopnea index of 39 events per hour.  The  events were worse in the supine position and there was loud snoring  noted throughout.   OXYGEN DATA:  There was O2 desaturation as low as 80% with the patient's  obstructive events.   CARDIAC DATA:  Occasional PVCs were noted, but no clinically significant  arrhythmia.   MOVEMENT/PARASOMNIA:  The patient had no periodic leg movements noted or  abnormal behaviors seen.   IMPRESSIONS/RECOMMENDATIONS:  1. Severe obstructive apnea/hypopnea syndrome with an apnea/hypopnea      index of 39 events per hour and O2 desaturation as low as 80%.      Treatment for this degree of sleep apnea should focus primarily on      CPAP as well as weight loss if applicable.  2. Occasional premature ventricular contractions noted, but no      clinically significant arrhythmia seen.     Barbaraann Share, MD,FCCP  Diplomate, American Board of Sleep  Medicine  Electronically Signed    KMC/MEDQ  D:  06/17/2008 17:42:56  T:  06/17/2008 18:10:27  Job:  [id number]

## 2011-05-10 NOTE — Letter (Signed)
December 23, 2010   Jeannett Senior A. Saint Martin, MD  [address]  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  He does have some small nodules and he may  require prednisone.  I will refer him back to you to decide whether to  initiate treatment or to refer him for a pulmonologist.  I will be happy  to see him again if he has any further problems.   Ines Bloomer, M.D.  Electronically Signed   DPB/MEDQ  D:  12/23/2010  T:  12/24/2010  Job:  [id number]

## 2011-05-10 NOTE — Letter (Signed)
December 14, 2010   Jeannett Senior A. Saint Martin, MD  [address]  [place], [place] [address]   Re:  Willie [name], Willie [name]                DOB:  [birth year]   Dear Dr. Evlyn Kanner:   I appreciate the opportunity of seeing the patient.  This 50 year old  type 2 diabetic was having a flu shot and then developed anasarca and  joint pains after the flu shot and marked swelling particularly in his  ankle.  He was started on some antibiotics and over the last several  days has had a decrease in his joint pains and ankle swelling.  He has  obstructive sleep apnea.  He uses CPAP at night. A chest x-ray was done  and then a CT scan that showed mediastinal adenopathy.  He is referred  here for his mediastinal adenopathy.  He has had no recent fever,  chills, or excessive sputum.  No other adenopathy.  His medication  included hydrochlorothiazide, atenolol, Lasix, NovoLog, Synthroid,  Elavil, buspirone, cyclobenzaprine, hydrocodone triamcinolone, Robaxin,  Lyrica, Wellbutrin, Crestor, potassium, and inhalers.   He has no known allergies.   He has hypercholesterolemia, hypothyroidism, diabetes mellitus type 1.   His family history is noncontributory.   Social, he is married, has one children, he is a former smoker and does  not drink alcohol on a regular basis.   REVIEW OF SYSTEMS:  He is 5 feet 10 inches, 212 pounds, he has some  weight gain.  CARDIAC:  He has some chest tightness.  No angina.  PULMONARY:  See history of present illness.  GI:  He has got reflux,  abdominal pain.  GU:  Pain in his legs with walking.  No DVT or TIAs.  NEUROLOGICAL:  Dizziness.  No seizures.  MUSCULOSKELETAL:  Joint pain.  PSYCHIATRIC:  Has been treated for depression and nervousness.  ENT:  No  changes in eyesight or hearing.  NEUROLOGICAL:  Problems with bleeding,  clotting disorders, or anemia.   PHYSICAL EXAMINATION:  GENERAL:  On physical exam, he is a well-  developed Caucasian male in no acute distress.  VITAL SIGNS:  His  blood  pressure is 131/77, pulse 96, respirations 16.  HEAD, EYES, EARS, NOSE,  AND THROAT:  Unremarkable.  NECK:  Supple without thyromegaly.  There is  no supraclavicular or axillary adenopathy.  CHEST:  Clear to  auscultation and percussion.  HEART:  Regular sinus rhythm.  ABDOMEN:  Soft.  EXTREMITIES:  He has got 2+ edema, 1+ pulses.  No clubbing.  NEUROLOGICAL:  He is oriented x3.   Reviewed his CT scan, it does show mediastinal as well as left and right  hilar adenopathy.  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  I appreciate the  opportunity of seeing the patient.   Sincerely,   Ines Bloomer, M.D.  Electronically Signed   DPB/MEDQ  D:  12/14/2010  T:  12/15/2010  Job:  [id number]

## 2011-05-13 NOTE — Op Note (Signed)
NAMECHARLTON, Willie [name] NO.:  [id number]   MEDICAL RECORD NO.:  [id number]                   PATIENT TYPE:  INP   LOCATION:  2899                                 FACILITY:  [facility]   PHYSICIAN:  Payton Doughty, M.D.                   DATE OF BIRTH:  [birth year]   DATE OF PROCEDURE:  03/02/2004  DATE OF DISCHARGE:                                 OPERATIVE REPORT   PREOPERATIVE DIAGNOSIS:  Herniated disc at C6-C7.   POSTOPERATIVE DIAGNOSIS:  Herniated disc at C6-C7.   OPERATIVE PROCEDURE:  C6-C7 anterior cervical decompression and fusion with  a tethered plate.   SURGEON:  Payton Doughty, M.D.   SERVICE:  Neurosurgery   ANESTHESIA:  General endotracheal anesthesia.   PREPARATION:  Betadine and alcohol wipe.   COMPLICATIONS:  None.   ASSISTANT:  Nurse assistant [facility]  Doctor assistant Danae Orleans. Venetia Maxon, M.D.   BODY OF TEXT:  50 year old gentleman with herniated disc and early  myelopathy.  He is taken to the operating room, smooth anesthetized, and  intubated.  He is placed supine on the operating table.  Following shave,  prep, and drape in the usual sterile fashion, the skin was incised in the  midline at the medial border of the sternocleidomastoid muscle on the left  side about a fingerbreadth below the level of the carotid tubercle.  The  platysma was identified, elevated, divided, and undermined.  The  sternocleidomastoid muscle was identified and dissection revealed the  carotid artery retracted laterally to the left, trachea and esophagus  retracted laterally to the right, exposing the bones of the anterior  cervical spine.  A marker was placed and interoperative x-ray was obtained.  The marker was at C5-C6, discectomy was carried out at the level below that,  C6-C7.  The longus colli was taken down bilaterally.  The trachea and  esophagus were protected.  Discectomy was carried out under gross  observation and the disc space evacuated.  The  operating microscope was then  brought in and we used microdissection technique to remove the remainder of  the disc and the posterior annulus, divide the posterior longitudinal  ligament, and explore both neural foramina.  We found there was herniated  disc that had not broken through the annulus but had caused significant  bulging in the annulus and compression of the underlying dura.  Following  complete decompression, 7 mm bone grafts were fashioned with patellar  allograft and tapped into place.  A 14 mm tethered plate with 13 mm screws  was then placed, two screws in C6 and two in C7.  Interoperative x-ray  showed the top end of the plate and screws to be in good position.  The  wound was irrigated and hemostasis assured.  The platysma was reapproximated  with 3-0 Vicryl in an interrupted fashion, the subcutaneous tissue were  reapproximated with  3-0 Vicryl in an interrupted  fashion, the skin was closed with 4-0 Vicryl in a running subcuticular  fashion.  Benzoin and Steri-Strips were placed and made occlusive with Telfa  and OpSite.  The patient was placed in an Aspen collar and returned to the  recovery room.                                               Payton Doughty, M.D.    MWR/MEDQ  D:  03/02/2004  T:  03/02/2004  Job:  [id number]

## 2011-05-13 NOTE — Op Note (Signed)
[place]  Patient:    Willie [name], Willie [name] Visit Number: [id number] MRN: [id number]          Service Type: Attending:  Nicki Reaper, M.D. Dictated by:   Nicki Reaper, M.D. Proc. Date: 11/27/01                             Operative Report  PREOPERATIVE DIAGNOSES:  1. Carpal tunnel syndrome, right hand.  2. Stenosing tenosynovitis, right thumb, right middle finger.  POSTOPERATIVE DIAGNOSES:  1. Carpal tunnel syndrome, right hand.  2. Stenosing tenosynovitis, right thumb, right middle finger.  OPERATION/PROCEDURE:  1. Release of A1 pulley of right thumb, right middle finger.  2. Release of right carpal tunnel.  SURGEON: Nicki Reaper, M.D.  ASSISTANT: Joaquin Courts, R.N.  ANESTHESIA: Forearm-based IV regional.  ANESTHESIOLOGIST: Halford Decamp, M.D.  INDICATIONS FOR PROCEDURE: The patient is a 50 year old male, with a history of carpal tunnel syndrome, EMG nerve conduction positive, which has not responded to conservative treatment, plus stenosing tenosynovitis of the thumb and middle finger.  He is diabetic.  DESCRIPTION OF PROCEDURE: The patient was brought to the operating room, where a forearm-based IV regional anesthetic was carried out without difficulty.  He was prepped and draped using Betadine scrubbing solution with the right arm free.  A longitudinal incision was made in the palm and carried down through subcutaneous tissue.  Bleeders were electrocauterized.  The palmar fascia was split, the superficial palmar arch identified, and the flexor tendon to the ring and little finger identified to the ulnar side of the median nerve.  The carpal retinaculum was incised with sharp dissection.  A right angle and Sewall retractor were placed between skin and forearm fascia and the fascia released for approximately 3 cm proximal to the wrist crease under direct vision.  The canal was explored and no further lesions were  identified. Tenosynovial tissue was moderately thickened.  Motor branch entered into muscle.  The wound was irrigated and closed with interrupted 5-0 nylon sutures.  A separate incision was then made over the thumb transversely at the A1 pulley, carried down through subcutaneous tissues, bleeders again electrocauterized.  The radial and ulnar digital nerve and artery were identified and protected.  The A1 pulley was then released on the radial aspect, protecting the oblique pulley.  An incision was then made over the A1 pulley of the middle finger obliquely, carried down through subcutaneous tissue.  Again the A1 pulley was identified and release performed on the radial aspect with protection to the neurovascular bundles of each.  Thumb and middle finger were placed through full range of motion and no further triggering was evident.  The wounds were irrigated and closed with interrupted 5-0 nylon suture.  A sterile compressive dressing and splint were applied. The patient tolerated the procedure well and was taken to the recovery room for observation in satisfactory condition.  He is discharged home to return to the [facility] in one week, on Vicodin and Keflex. Dictated by:   Nicki Reaper, M.D. Attending:  Nicki Reaper, M.D. DD:  11/27/01 TD:  11/27/01 Job: [id number]

## 2011-05-13 NOTE — Op Note (Signed)
Willie [name], SCHAPPELL NO.:  [id number]   MEDICAL RECORD NO.:  [id number]          PATIENT TYPE:  AMB   LOCATION:  DSC                          FACILITY:  [facility]   PHYSICIAN:  Adolph Pollack, M.D.DATE OF BIRTH:  1961/08/26   DATE OF PROCEDURE:  11/21/2006  DATE OF DISCHARGE:                               OPERATIVE REPORT   PREOPERATIVE DIAGNOSIS:  Five-centimeter soft tissue mass, posterior  left thigh.   POSTOPERATIVE DIAGNOSIS:  Five-centimeter cystic soft tissue mass,  posterior left thigh.   PROCEDURE:  Excision of soft tissue mass, left thigh.   SURGEON:  Adolph Pollack, M.D.   ANESTHESIA:  Local.   INDICATIONS:  This 50 year old male is a diabetic.  He has a soft tissue  mass on the left thigh becoming somewhat symptomatic.  It started out  small and it has gradually increased in size.  He now presents for  removal.   TECHNIQUE:  He was brought into the minor procedure room and positioned  on his right side.  The hair around the area was clipped and then the  lesion was marked and the area was sterilely prepped and draped.  Local  anesthetic was infiltrated directly over the lesion.  A longitudinal  incision was made directly over the lesion through the skin and  subcutaneous tissue.  There was a large cystic-like mass with the  capsule fairly densely adherent to the subcutaneous tissue.  I  subsequently opened up the cyst and decompressed it.  This was caseous-  type material.  I was unable to grasp the cyst wall and used sharp  dissection to separate it from the subcutaneous tissue and removed the  cyst wall in its entirety.  I inspected the wound and no other cyst wall  was noted.  There was some bleeding, which I controlled with  electrocautery.   Once hemostasis was adequate, the wound was closed with interrupted 3-0  nylon sutures.  Antibacterial ointment and a sterile dressing were  applied.   He tolerated the procedure well without  any apparent complications and  was discharged from the minor procedure room in satisfactory condition  with discharge instructions.      Adolph Pollack, M.D.  Electronically Signed     TJR/MEDQ  D:  11/23/2006  T:  11/24/2006  Job:  [id number]

## 2011-05-13 NOTE — H&P (Signed)
NAMESAMANTHA, OLIVERA NO.:  [id number]   MEDICAL RECORD NO.:  [id number]                   PATIENT TYPE:  INP   LOCATION:  3009                                 FACILITY:  [facility]   PHYSICIAN:  Payton Doughty, M.D.                   DATE OF BIRTH:  1961/08/28   DATE OF ADMISSION:  03/02/2004  DATE OF DISCHARGE:                                HISTORY & PHYSICAL   ADMITTING DIAGNOSIS:  Herniated disk C6-7.   This is a now 50 year old, right handed, white gentleman I have been  following for numerous years.  He had some tingling of his hands and an MR  that shows a disk at 6-7 with flattening of his cord, and he is developing  myelopathies, now admitted for an anterior cervicectomy fusion.   MEDICAL HISTORY:  Diabetes.  He has been on insulin pump since he was 50  years of age.   He takes:  1. Prilosec 20 mg a day.  2. BuSpar 20 mg a day.  3. Propulsid 10 mg a day.  4. Prinivil 20 mg a day.  5. Lipitor 20 mg a day.  6. Phenergan 12.5 mg a day on a p.r.n. basis.  7. Lodine 400 mg at bedtime.   SURGICAL HISTORY:  __________ operation in childhood in 1964-1965.   FAMILY HISTORY:  Mother is 36 in fair health.  Dad is 40 with  hypercholesterolemia and hypertension.   SOCIAL HISTORY:  He does not smoke.  He does not drink.  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   PHYSICAL EXAMINATION:  HEENT:  Within normal limits.  NECK:  Good range of motion.  CHEST:  Clear.  CARDIAC:  Unremarkable.  ABDOMEN:  Nontender.  No hepatosplenomegaly.  EXTREMITIES:  Without clubbing, cyanosis.  Peripheral pulses are good.  GU:  Deferred.  NEUROLOGIC:  He is awake, alert, and oriented.  His cranial nerves are  intact.  Motor exam shows 5/5 strength throughout the upper extremities,  save for a mild diminution __________ on the grip.  Lower extremities are  full strength with no sensory deficit.  Reflexes are absent in the upper and  lower extremities save for a  mild Hoffmann's on the right.   His MRI demonstrates at disk at 6-7 with flattening of the cord.   CLINICAL IMPRESSION:  Cervical spondylosis with intermittent myelopathy.   PLAN:  For an anterior cervicectomy fusion.  The risks and benefits of this  approach have been discussed with him.  He wishes to proceed.                                                Payton Doughty, M.D.    MWR/MEDQ  D:  03/02/2004  T:  03/02/2004  Job:  [id number]

## 2011-05-13 NOTE — Op Note (Signed)
[place]  Patient:    Willie [name], Willie [name]                         MRN: [id number] Proc. Date: 01/04/01 Attending:  Nicki Reaper, M.D.                           Operative Report  PREOPERATIVE DIAGNOSIS:  Carpal tunnel syndrome, left hand.  Stenosing tenosynovitis, left index and middle fingers.  POSTOPERATIVE DIAGNOSIS:  Carpal tunnel syndrome, left hand.  Stenosing tenosynovitis, left index and middle fingers.  OPERATION PERFORMED:  Release A-1 pulleys, index and middle fingers of left hand; carpal tunnel release, left palm.  SURGEON:  Nicki Reaper, M.D.  ASSISTANTCarolyne Fiscal, RN.  ANESTHESIA:  Forearm based IV regional.  ANESTHESIOLOGIST:  Dr. Gypsy Balsam.  INDICATIONS FOR PROCEDURE:  The patient is a 50 year old male with a history of triggering of the index and middle fingers of his left hand and carpal tunnel syndrome, EMG nerve conduction positive, which has not responded to conservative treatment.  DESCRIPTION OF PROCEDURE:  The patient was brought to the operating room where a forearm based IV regional anesthetic was carried out without difficulty.  He was prepped and draped using Betadine scrub and solution with the left arm free.  An oblique incision was made over the A-1 pulley of the index and middle fingers, carried down through subcutaneous tissues.  Bleeders were electrocauterized.  Neurovascular structures were protected on each finger which was done separately.  The A-1 pulley was identified.  An incision was made on the radial aspect.  A small incision made in the A-2 pulley.  The finger placed through a full range of motion.  No further triggering was evident.  The wounds were irrigated.  The skin was closed with interrupted 5-0 nylon sutures.  The separate incision was then made in the palm, carried down through subcutaneous tissue.  Bleeders were again electrocauterized.  The flexor tendon to the ring and little finger identified  to the ulnar side of the median nerve.  The carpal retinaculum was incised with sharp dissection. A right angle and Sewell retractor were placed between skin and forearm fascia.  The fascia was released for approximately 3 cm proximal to the wrist crease under direct vision.  Canal was explored.  No further lesions were identified.  The wound was irrigated.  The skin was closed with interrupted 5-0 nylon sutures.  A sterile compressive dressing and splint was applied. The patient tolerated the procedure well and was taken to the recovery room for observation in satisfactory condition.  He was discharged home to return to the [facility] in one week on Vicodin and Keflex. DD:  01/04/01 TD:  01/04/01 Job: [id number]

## 2011-08-22 ENCOUNTER — Other Ambulatory Visit: Payer: Self-pay | Admitting: Dermatology

## 2011-12-13 ENCOUNTER — Other Ambulatory Visit: Payer: Self-pay | Admitting: Pulmonary Disease

## 2011-12-15 ENCOUNTER — Other Ambulatory Visit: Payer: Self-pay | Admitting: Pulmonary Disease

## 2011-12-16 ENCOUNTER — Other Ambulatory Visit: Payer: Self-pay | Admitting: Pulmonary Disease

## 2012-03-11 ENCOUNTER — Other Ambulatory Visit: Payer: Self-pay | Admitting: Pulmonary Disease

## 2012-07-03 IMAGING — CR DG CHEST 2V
2 series · 2 of 2 positions shown · non-contrast
Comparison: None

CLINICAL DATA: Mediastinal adenopathy, preop.

CHEST - 2 VIEW

[view not recorded (1 of 2)]
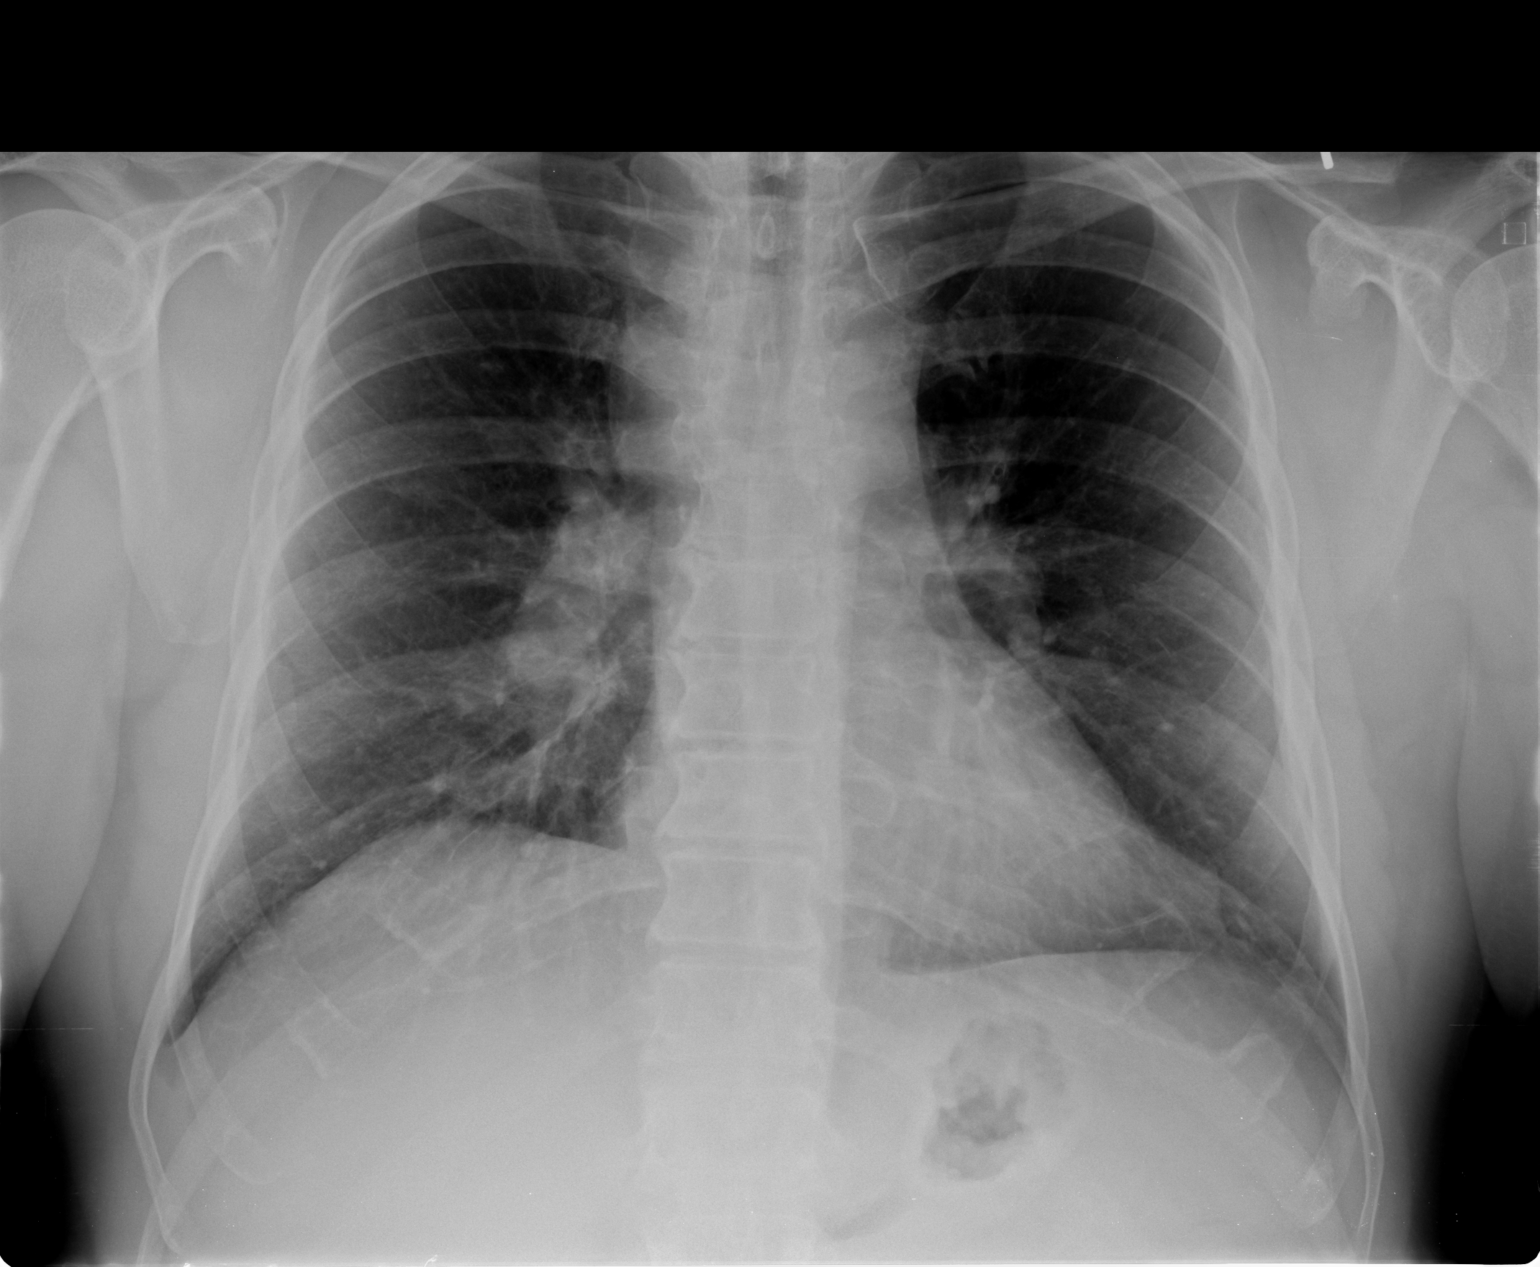

[view not recorded (2 of 2)]
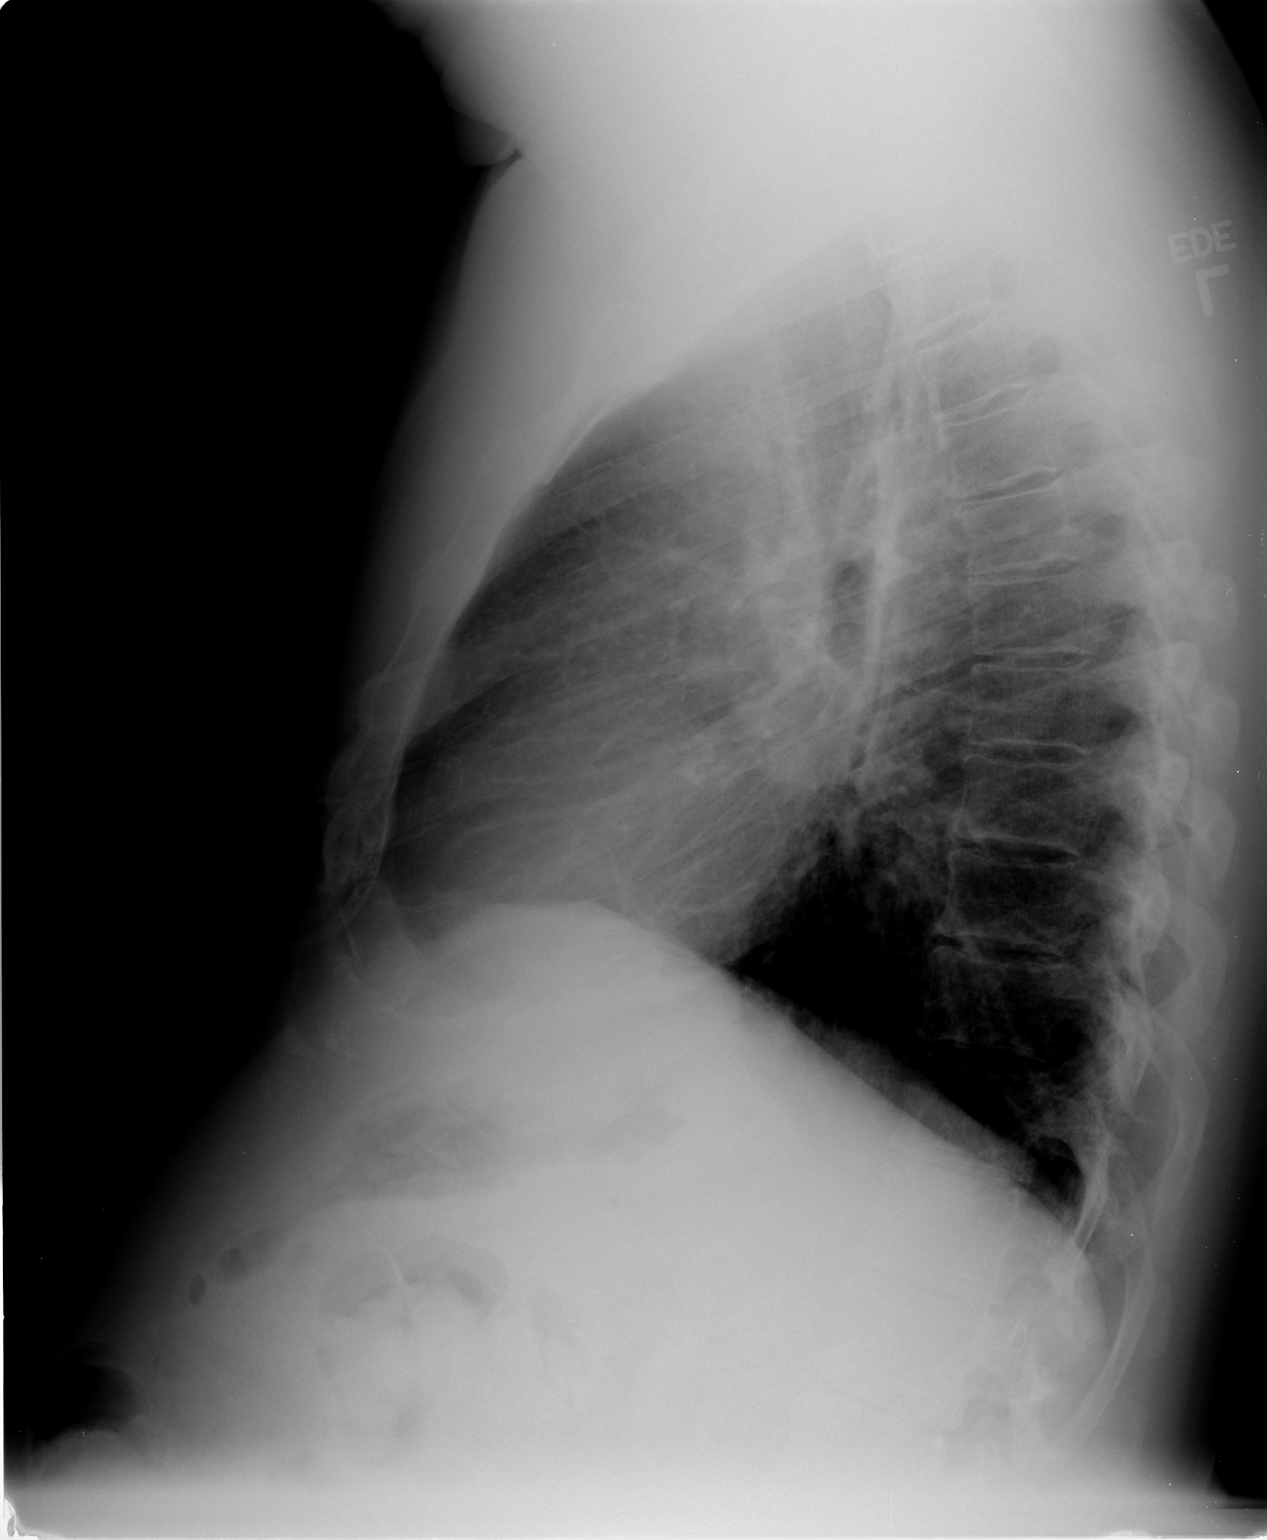

[2 of 2 positions shown; findings below may reference images not displayed]

FINDINGS: There are is mediastinal fullness and bilateral hilar
fullness, presumably adenopathy.  Heart is normal size.  Lungs are
clear.  No effusions or acute bony abnormality.
IMPRESSION: Mediastinal adenopathy.  Bilateral hilar fullness may also
represent adenopathy.

## 2014-01-24 ENCOUNTER — Other Ambulatory Visit: Payer: Self-pay | Admitting: Endocrinology

## 2014-01-24 DIAGNOSIS — G834 Cauda equina syndrome: Secondary | ICD-10-CM

## 2014-01-31 ENCOUNTER — Ambulatory Visit
Admission: RE | Admit: 2014-01-31 | Discharge: 2014-01-31 | Disposition: A | Discharge status: Home / Self Care | Payer: Medicare Other | Source: Ambulatory Visit | Attending: Endocrinology | Admitting: Endocrinology

## 2014-01-31 DIAGNOSIS — G834 Cauda equina syndrome: Secondary | ICD-10-CM

## 2018-11-07 ENCOUNTER — Encounter: Payer: Self-pay | Admitting: Neurology

## 2018-11-09 ENCOUNTER — Other Ambulatory Visit: Payer: Self-pay | Admitting: Endocrinology

## 2018-11-09 DIAGNOSIS — R1011 Right upper quadrant pain: Secondary | ICD-10-CM

## 2018-11-09 NOTE — Progress Notes (Signed)
Willie [name] was seen today in the movement disorders clinic for neurologic consultation at the request of Reynold Bowen, MD.  The consultation is for the evaluation of tremor.  The records that were made available to me were reviewed.  This patient is accompanied in the office by his spouse who supplements the history.  Tremor: Yes.     How long has it been going on? 1 year  At rest or with activation?  With activation  When is it noted the most?  Fine motor coordination  Fam hx of tremor?  Yes.  , mother  Located where?  Bilateral UE and both hands shake equally (he is R hand dominant)  Affected by caffeine:  maybe  Affected by alcohol:  Doesn't drink enough to know  Affected by stress:  Yes.    Affected by fatigue:  Yes.    Spills soup if on spoon:  May or may not  Spills glass of liquid if full:  No.  Affects ADL's (tying shoes, brushing teeth, etc):  No.  Tremor inducing meds:  No.  Neuroimaging of the brain has not previously been performed (that is available) but pt thinks that it may have been done in the past   ALLERGIES:  Not on File  CURRENT MEDICATIONS:  Outpatient Encounter Medications as of 11/13/2018  Medication Sig  . amitriptyline (ELAVIL) 50 MG tablet Take 50 mg by mouth daily.  Marland Kitchen atenolol (TENORMIN) 50 MG tablet Take 150 mg by mouth daily.  Marland Kitchen buPROPion (WELLBUTRIN SR) 150 MG 12 hr tablet   . busPIRone (BUSPAR) 10 MG tablet   . insulin lispro (HUMALOG) 100 UNIT/ML injection   . latanoprost (XALATAN) 0.005 % ophthalmic solution Apply to eye.  . levothyroxine (SYNTHROID) 200 MCG tablet Take 200 mcg by mouth daily.  Marland Kitchen omeprazole (PRILOSEC) 40 MG capsule TAKE ONE CAPSULE BY MOUTH EVERY MORNING AND ONE EACH EVENING  . pregabalin (LYRICA) 75 MG capsule   . rosuvastatin (CRESTOR) 10 MG tablet Take 10 mg by mouth daily.  Marland Kitchen topiramate (TOPAMAX) 25 MG tablet   . traMADol (ULTRAM) 50 MG tablet TAKE ONE TABLET BY MOUTH DAILY AS NEEDED   No facility-administered  encounter medications on file as of 11/13/2018.     PAST MEDICAL HISTORY:   Past Medical History:  Diagnosis Date  . BPH (benign prostatic hyperplasia)   . Diabetes ([place])   . Diabetic retinopathy ([place])   . Esophageal stricture   . Hyperlipidemia   . Hypertension   . Hypothyroidism   . Melanoma ([place])   . OSA (obstructive sleep apnea)   . Polyneuropathy   . Tremor     PAST SURGICAL HISTORY:   Past Surgical History:  Procedure Laterality Date  . CARPAL TUNNEL RELEASE Bilateral 2002  . [place] SURGERY  2005  . CYST EXCISION  2002   middle finger  . CYST EXCISION  2007   left thigh    SOCIAL HISTORY:   Social History   Socioeconomic History  . Marital status: Married    Spouse name: Not on file  . Number of children: Not on file  . Years of education: Not on file  . Highest education level: Not on file  Occupational History  . Not on file  Social Needs  . Financial resource strain: Not on file  . Food insecurity:    Worry: Not on file    Inability: Not on file  . Transportation needs:    Medical: Not on file  Non-medical: Not on file  Tobacco Use  . Smoking status: Not on file  Substance and Sexual Activity  . Alcohol use: Not on file  . Drug use: Not on file  . Sexual activity: Not on file  Lifestyle  . Physical activity:    Days per week: Not on file    Minutes per session: Not on file  . Stress: Not on file  Relationships  . Social connections:    Talks on phone: Not on file    Gets together: Not on file    Attends religious service: Not on file    Active member of club or organization: Not on file    Attends meetings of clubs or organizations: Not on file    Relationship status: Not on file  . Intimate partner violence:    Fear of current or ex partner: Not on file    Emotionally abused: Not on file    Physically abused: Not on file    Forced sexual activity: Not on file  Other Topics Concern  . Not on file  Social History Narrative    . Not on file    FAMILY HISTORY:   Family Status  Relation Name Status  . Mother  (Not Specified)  . Father  (Not Specified)    ROS:  Review of Systems  Constitutional: Negative.   HENT: Negative.   Eyes: Negative.   Respiratory: Negative.   Cardiovascular: Negative.   Gastrointestinal: Positive for heartburn.  Musculoskeletal: Negative.   Skin: Negative.   Endo/Heme/Allergies: Negative.     PHYSICAL EXAMINATION:    VITALS:   Vitals:   11/13/18 0944  BP: 120/82  Pulse: 86  SpO2: 97%  Weight: 220 lb (99.8 kg)  Height: 5\' 10"  (1.778 m)    GEN:  The patient appears stated age and is in NAD. HEENT:  Normocephalic, atraumatic.  The mucous membranes are moist. The superficial temporal arteries are without ropiness or tenderness. CV:  RRR Lungs:  CTAB Neck/HEME:  There are no carotid bruits bilaterally.  Neurological examination:  Orientation: The patient is alert and oriented x3. Fund of knowledge is appropriate.  Recent and remote memory are intact.  Attention and concentration are normal.    Able to name objects and repeat phrases. Cranial nerves: There is good facial symmetry. Pupils are equal round and reactive to light bilaterally. Fundoscopic exam reveals clear margins bilaterally. Extraocular muscles are intact with the exception of horizontal, chronic esotropia.   The visual fields are full to confrontational testing. The speech is fluent and clear. Soft palate rises symmetrically and there is no tongue deviation. Hearing is intact to conversational tone. Sensation: Sensation is intact to light and pinprick throughout (facial, trunk, extremities). Vibration is decreased distally. There is no extinction with double simultaneous stimulation. There is no sensory dermatomal level identified. Motor: Strength is 5/5 in the bilateral upper and lower extremities.   Shoulder shrug is equal and symmetric.  There is no pronator drift. Deep tendon reflexes: Deep tendon reflexes  are 0-1/4 at the bilateral biceps, triceps, brachioradialis, patella and achilles. Plantar responses are downgoing bilaterally.  Movement examination: Tone: There is normal tone in the bilateral upper extremities.  The tone in the lower extremities is normal.  Abnormal movements: There is no rest tremor.  There is mild tremor of the outstretched hands.  This does not particularly increased with intention.  There is no significant increase in tremor when given a weight.  Mild tremor is noted with Archimedes  spirals on the right.  He does not spill much water when asked to pour water from one glass to another. Coordination:  There is no decremation with RAM's, with any form of RAMS, including alternating supination and pronation of the forearm, hand opening and closing, finger taps, heel taps and toe taps. Gait and Station: The patient has no difficulty arising out of a deep-seated chair without the use of the hands. The patient's stride length is normal.  He does have some difficulty ambulating in a tandem fashion  Labs: Lab work is reviewed.  Lab work is dated July 05, 2018.  Sodium was 142, potassium 4.8, chloride 103, CO2 26, BUN 13, creatinine 1.1, glucose 108.  White blood cells were 7.3, hemoglobin 16.0, hematocrit 48.9 and platelets 260.  TSH was 1.10.  ASSESSMENT/PLAN:  1.   Essential Tremor.  - We discussed nature and pathophysiology.  We discussed that this can continue to gradually get worse with time.  We discussed that some medications can worsen this, as can caffeine use.  We discussed medication therapy as well as surgical therapy.  Ultimately, the patient decided to hold off on any medicine, as his is really fairly mild and he would like to hold off on adding even more medication to his list, which I think is a reasonable decision.  He and his wife asked several questions and I answered them to the best of my ability.  2.  Peripheral neuropathy  -The patient has clinical examination  evidence of a diffuse peripheral neuropathy, which certainly can affect gait and balance.  We discussed safety associated with peripheral neuropathy.  We discussed balance therapy.  His is likely d/t diabetes.  He is trying to control better.  States that his last A1c was approximately 7.4, which was up from 6.7.    3.  F/u prn.    Cc:  Reynold Bowen, MD

## 2018-11-13 ENCOUNTER — Ambulatory Visit (INDEPENDENT_AMBULATORY_CARE_PROVIDER_SITE_OTHER): Payer: Medicare Other | Admitting: Neurology

## 2018-11-13 ENCOUNTER — Encounter: Payer: Self-pay | Admitting: Neurology

## 2018-11-13 VITALS — BP 120/82 | HR 86 | Ht 70.0 in | Wt 220.0 lb

## 2018-11-13 DIAGNOSIS — G250 Essential tremor: Secondary | ICD-10-CM

## 2018-11-13 DIAGNOSIS — E1142 Type 2 diabetes mellitus with diabetic polyneuropathy: Secondary | ICD-10-CM

## 2018-11-16 ENCOUNTER — Ambulatory Visit
Admission: RE | Admit: 2018-11-16 | Discharge: 2018-11-16 | Disposition: A | Discharge status: Home / Self Care | Payer: Medicare Other | Source: Ambulatory Visit | Attending: Endocrinology | Admitting: Endocrinology

## 2018-11-16 DIAGNOSIS — R1011 Right upper quadrant pain: Secondary | ICD-10-CM

## 2018-11-16 MED ORDER — IOPAMIDOL (ISOVUE-300) INJECTION 61%
100.0000 mL | Freq: Once | INTRAVENOUS | Status: AC | PRN
  Administered 2018-11-16: 100 mL via INTRAVENOUS

## 2019-07-23 ENCOUNTER — Other Ambulatory Visit: Payer: Self-pay | Admitting: Orthopedic Surgery

## 2019-07-23 DIAGNOSIS — M79644 Pain in right finger(s): Secondary | ICD-10-CM

## 2019-08-02 ENCOUNTER — Other Ambulatory Visit: Payer: Medicare Other

## 2019-08-02 ENCOUNTER — Ambulatory Visit
Admission: RE | Admit: 2019-08-02 | Discharge: 2019-08-02 | Disposition: A | Discharge status: Home / Self Care | Payer: Medicare Other | Source: Ambulatory Visit | Attending: Orthopedic Surgery | Admitting: Orthopedic Surgery

## 2019-08-02 DIAGNOSIS — M79644 Pain in right finger(s): Secondary | ICD-10-CM

## 2020-06-02 ENCOUNTER — Other Ambulatory Visit: Payer: Self-pay

## 2020-06-02 ENCOUNTER — Institutional Professional Consult (permissible substitution): Payer: Medicare Other | Admitting: Emergency Medicine

## 2020-07-07 ENCOUNTER — Encounter: Payer: Self-pay | Admitting: Emergency Medicine

## 2020-07-07 ENCOUNTER — Other Ambulatory Visit: Payer: Self-pay

## 2020-07-07 ENCOUNTER — Ambulatory Visit: Payer: Medicare Other | Admitting: Emergency Medicine

## 2020-07-07 DIAGNOSIS — G4733 Obstructive sleep apnea (adult) (pediatric): Secondary | ICD-10-CM | POA: Diagnosis not present

## 2020-07-07 DIAGNOSIS — R05 Cough: Secondary | ICD-10-CM

## 2020-07-07 DIAGNOSIS — D869 Sarcoidosis, unspecified: Secondary | ICD-10-CM

## 2020-07-07 DIAGNOSIS — R059 Cough, unspecified: Secondary | ICD-10-CM

## 2020-07-07 NOTE — Assessment & Plan Note (Signed)
Good compliance and good clinical benefit from his CPAP.  He wears reliably.  He has had the same machine for over 10 years.  Download information available.  He is due for a new machine, new equipment and we will work to obtain through [employer].

## 2020-07-07 NOTE — Patient Instructions (Signed)
We will perform pulmonary function testing in next office visit. We will repeat your CT scan of the chest with contrast to follow sarcoidosis Try increasing your omeprazole to 40 mg twice a day until next visit.  Take this medication about 1 hour around food. Continue your CPAP every night.  You would qualify for a new machine and we will try to get this for you as well as regular supplies through [facility] ([facility]).  Follow with Dr. Lamonte Sakai next available with full pulmonary function testing on the same day.

## 2020-07-07 NOTE — Addendum Note (Signed)
Addended by: Gavin Potters R on: 07/07/2020 04:20 PM   Modules accepted: Orders

## 2020-07-07 NOTE — Assessment & Plan Note (Signed)
I suspect that he is moderately controlled GERD is a large contributor, consider also slow progression of sarcoid.  He used to be on omeprazole twice a day now on daily.  Will empirically try increasing back to twice daily to see if he gets benefit.  Sarcoid work-up as above.

## 2020-07-07 NOTE — Assessment & Plan Note (Signed)
Progressive dyspnea, some increased cough.  Certainly could relate to airflow obstruction and sarcoidosis.  He needs repeat pulmonary function testing, CT scan of the chest to establish degree of lymphadenopathy, possible interstitial disease.  Based on this will determine appropriate therapy.

## 2020-07-07 NOTE — Progress Notes (Signed)
Subjective:    Patient ID: Willie [name], male    DOB: 22-Apr-1961, 59 y.o.   MRN: [id number]  HPI 59 year old never smoker with a history diabetes, hypertension, hypothyroidism.  Has been followed in our office by Dr. Gwenette Greet in the past for obstructive sleep apnea and sarcoidosis that was diagnosed by lymph nodal biopsies (EBUS) 12/17/2010. He reports that he is coughing a lot, especially at night, can be productive of white thick. Has been more problematic over about 6 months. He can have some chest tightness when he is supine. He has significant exertional SOB.  Has never been on BD's. Has been on pred before for ortho pain, etc. Not for sarcoidosis. Has lost some wt over the last year.  He is on omeprazole, seems to control his GERD sx, ? Some breakthrough. He has good CPAP compliance - gets good benefit.   Most recent available chest imaging is a chest x-ray 12/15/2010 that showed bilateral hilar fullness and mediastinal lymphadenopathy without any other clear infiltrates.  PFT done 01/06/2011 reviewed by me, show principally restriction, possible mixed obstruction, normal DLCO   Review of Systems As per HPI  Past Medical History:  Diagnosis Date   BPH (benign prostatic hyperplasia)    Diabetes ([place])    Diabetic neuropathy ([place])    Diabetic retinopathy ([place])    Esophageal stricture    Hyperlipidemia    Hypertension    Hypothyroidism    Melanoma ([place])    OSA (obstructive sleep apnea)    Polyneuropathy    Tremor      Family History  Problem Relation Age of Onset   Tremor Mother    Thyroid disease Mother    CAD Father    COPD Father    Other Brother        unknown   Healthy Son      Social History   Socioeconomic History   Marital status: Married    Spouse name: Not on file   Number of children: Not on file   Years of education: Not on file   Highest education level: Not on file  Occupational History   Occupation: disability    Comment:  firefighter -   Tobacco Use   Smoking status: Never Smoker   Smokeless tobacco: Former Network engineer and Sexual Activity   Alcohol use: Yes    Comment: rare (less than one every couple months)   Drug use: Never   Sexual activity: Not on file  Other Topics Concern   Not on file  Social History Narrative   Not on file   Social Determinants of Health   Financial Resource Strain:    Difficulty of Paying Living Expenses:   Food Insecurity:    Worried About Charity fundraiser in the Last Year:    Arboriculturist in the Last Year:   Transportation Needs:    Film/video editor (Medical):    Lack of Transportation (Non-Medical):   Physical Activity:    Days of Exercise per Week:    Minutes of Exercise per Session:   Stress:    Feeling of Stress :   Social Connections:    Frequency of Communication with Friends and Family:    Frequency of Social Gatherings with Friends and Family:    Attends Religious Services:    Active Member of Clubs or Organizations:    Attends Archivist Meetings:    Marital Status:   Intimate Partner Violence:  Fear of Current or Ex-Partner:    Emotionally Abused:    Physically Abused:    Sexually Abused:     A retired Airline pilot, 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  Exposed to pesticides in past No military [place] native.   Allergies  Allergen Reactions   Other Other (See Comments), Shortness Of Breath and Swelling    Flu vaccine     Outpatient Medications Prior to Visit  Medication Sig Dispense Refill   amitriptyline (ELAVIL) 50 MG tablet Take 50 mg by mouth daily.     atenolol (TENORMIN) 50 MG tablet Take 150 mg by mouth daily.     buPROPion (WELLBUTRIN SR) 150 MG 12 hr tablet Take 150 mg by mouth 2 (two) times daily.      busPIRone (BUSPAR) 10 MG tablet      cyclobenzaprine (FLEXERIL) 10 MG tablet Take 10 mg by mouth 2 (two) times daily.     gabapentin (NEURONTIN) 300 MG capsule Take 300 mg by  mouth 2 (two) times daily.     insulin lispro (HUMALOG) 100 UNIT/ML injection      latanoprost (XALATAN) 0.005 % ophthalmic solution Apply to eye.     levothyroxine (SYNTHROID) 200 MCG tablet Take 200 mcg by mouth daily.     omeprazole (PRILOSEC) 40 MG capsule TAKE ONE CAPSULE BY MOUTH EVERY MORNING AND ONE EACH EVENING 60 capsule 1   rosuvastatin (CRESTOR) 10 MG tablet Take 10 mg by mouth 2 (two) times a week.      topiramate (TOPAMAX) 25 MG tablet Take 25 mg by mouth 2 (two) times daily.      traMADol (ULTRAM) 50 MG tablet TAKE ONE TABLET BY MOUTH DAILY AS NEEDED     vitamin B-12 (CYANOCOBALAMIN) 1000 MCG tablet Take 1,000 mcg by mouth daily.     No facility-administered medications prior to visit.        Objective:   Physical Exam  Vitals:   07/07/20 1512  BP: (!) 162/70  Pulse: 68  Temp: 98.7 F (37.1 C)  TempSrc: Oral  SpO2: 99%  Weight: 220 lb 9.6 oz (100.1 kg)  Height: 5\' 9"  (1.753 m)   Gen: Pleasant, overwt man, in no distress,  normal affect  ENT: No lesions,  mouth clear,  oropharynx clear, narrow posterior pharynx, no postnasal drip  Neck: No JVD, no stridor  Lungs: No use of accessory muscles, no crackles or wheezing on normal respiration, no wheeze on forced expiration  Cardiovascular: RRR, heart sounds normal, no murmur or gallops, no peripheral edema  Musculoskeletal: No deformities, no cyanosis or clubbing  Neuro: alert, awake, non focal  Skin: Warm, no lesions or rash      Assessment & Plan:  PULMONARY SARCOIDOSIS Progressive dyspnea, some increased cough.  Certainly could relate to airflow obstruction and sarcoidosis.  He needs repeat pulmonary function testing, CT scan of the chest to establish degree of lymphadenopathy, possible interstitial disease.  Based on this will determine appropriate therapy.  COUGH I suspect that he is moderately controlled GERD is a large contributor, consider also slow progression of sarcoid.  He used to be on  omeprazole twice a day now on daily.  Will empirically try increasing back to twice daily to see if he gets benefit.  Sarcoid work-up as above.  OBSTRUCTIVE SLEEP APNEA Good compliance and good clinical benefit from his CPAP.  He wears reliably.  He has had the same machine for over 10 years.  Download information available.  He is due for a new  machine, new equipment and we will work to obtain through [employer].   Baltazar Apo, MD, PhD 07/07/2020, 3:43 PM [facility] [phone] or if no answer [phone]

## 2020-07-09 ENCOUNTER — Other Ambulatory Visit: Payer: Self-pay

## 2020-07-09 ENCOUNTER — Ambulatory Visit (INDEPENDENT_AMBULATORY_CARE_PROVIDER_SITE_OTHER): Payer: Medicare Other

## 2020-07-09 DIAGNOSIS — D869 Sarcoidosis, unspecified: Secondary | ICD-10-CM | POA: Diagnosis not present

## 2020-07-17 ENCOUNTER — Telehealth: Payer: Self-pay | Admitting: Emergency Medicine

## 2020-07-17 NOTE — Telephone Encounter (Signed)
Dr. Lamonte Sakai, need settings for CPAP clarified. Recent DME order did not include settings, not found in chart.

## 2020-07-20 NOTE — Telephone Encounter (Signed)
He is a new patient. Will have to either get his settings from him based on download info, or order an auto-set with a range. Please seeif he has any info about his prior PSG or settings.

## 2020-07-20 NOTE — Telephone Encounter (Signed)
Called spoke with patient he states he has hard copies of all that information. He is going to fax it to Dr. Lamonte Sakai today , please be sure Dr. Lamonte Sakai gets it 07/21/20 in clinic. Gave fax number to patient.    Will route to [place] to keep look out for download

## 2020-07-23 NOTE — Telephone Encounter (Signed)
Please advise if this download has been received. Thanks!

## 2020-08-10 ENCOUNTER — Telehealth: Payer: Self-pay | Admitting: Emergency Medicine

## 2020-08-11 NOTE — Telephone Encounter (Signed)
Called pt to inform fax containing CPAP setting was not received. Was not able to leave voice mail due to voice mail not being set up. Will attempt to call at later time.

## 2020-08-11 NOTE — Telephone Encounter (Signed)
Estill Bamberg, please advise if you have received anything from pt. Thanks.

## 2020-08-12 NOTE — Telephone Encounter (Signed)
Called pt again today to inform we have not received CPAP settings. Phone line rang then switched over to a busy signal. Will attempt to call at a later time.

## 2020-08-13 NOTE — Telephone Encounter (Signed)
Pt calling to state he was not able to get his sleep study results. Pt can be reached at [phone]

## 2020-08-13 NOTE — Telephone Encounter (Signed)
Called and spoke with Patient.  Patient stated he thought he had sleep study results, but has been unable to find them.  Per Dr Lamonte Sakai- 07/17/20 telephone encounter- He is a new patient. Will have to either get his settings from him based on download info, or order an auto-set with a range. Please seeif he has any info about his prior PSG or settings.    Message routed to Dr Lamonte Sakai to advise

## 2020-08-13 NOTE — Telephone Encounter (Signed)
ATC pt. Unable to leave VM. WCB.

## 2020-08-17 NOTE — Telephone Encounter (Signed)
I don't know if any DME is give him new setting without sleep study documentation but OK to try >> auto-set 5-20cmH2O, heated humidity, mask of choice.   If no one can fill this order without a sleep study then will have to order a split night study.

## 2020-08-17 NOTE — Telephone Encounter (Signed)
LMTCB

## 2020-08-18 ENCOUNTER — Other Ambulatory Visit: Payer: Self-pay | Admitting: Emergency Medicine

## 2020-08-18 ENCOUNTER — Other Ambulatory Visit (HOSPITAL_COMMUNITY): Payer: Medicare Other

## 2020-08-19 LAB — SARS CORONAVIRUS 2 (TAT 6-24 HRS): SARS Coronavirus 2: NEGATIVE

## 2020-08-21 ENCOUNTER — Encounter: Payer: Self-pay | Admitting: Emergency Medicine

## 2020-08-21 ENCOUNTER — Other Ambulatory Visit: Payer: Self-pay

## 2020-08-21 ENCOUNTER — Ambulatory Visit (INDEPENDENT_AMBULATORY_CARE_PROVIDER_SITE_OTHER): Payer: Medicare Other | Admitting: Emergency Medicine

## 2020-08-21 ENCOUNTER — Ambulatory Visit: Payer: Medicare Other | Admitting: Emergency Medicine

## 2020-08-21 DIAGNOSIS — G4733 Obstructive sleep apnea (adult) (pediatric): Secondary | ICD-10-CM | POA: Diagnosis not present

## 2020-08-21 DIAGNOSIS — D869 Sarcoidosis, unspecified: Secondary | ICD-10-CM

## 2020-08-21 DIAGNOSIS — K219 Gastro-esophageal reflux disease without esophagitis: Secondary | ICD-10-CM | POA: Diagnosis not present

## 2020-08-21 LAB — PULMONARY FUNCTION TEST
DL/VA % pred: 120 %
DL/VA: 5.13 ml/min/mmHg/L
DLCO cor % pred: 91 %
DLCO cor: 25.68 ml/min/mmHg
DLCO unc % pred: 91 %
DLCO unc: 25.68 ml/min/mmHg
FEF 25-75 Post: 3.38 L/sec
FEF 25-75 Pre: 3.33 L/sec
FEF2575-%Change-Post: 1 %
FEF2575-%Pred-Post: 109 %
FEF2575-%Pred-Pre: 108 %
FEV1-%Change-Post: 0 %
FEV1-%Pred-Post: 70 %
FEV1-%Pred-Pre: 69 %
FEV1-Post: 2.6 L
FEV1-Pre: 2.57 L
FEV1FVC-%Change-Post: 0 %
FEV1FVC-%Pred-Pre: 111 %
FEV6-%Change-Post: 2 %
FEV6-%Pred-Post: 66 %
FEV6-%Pred-Pre: 65 %
FEV6-Post: 3.08 L
FEV6-Pre: 3 L
FEV6FVC-%Change-Post: 0 %
FEV6FVC-%Pred-Post: 104 %
FEV6FVC-%Pred-Pre: 103 %
FVC-%Change-Post: 1 %
FVC-%Pred-Post: 63 %
FVC-%Pred-Pre: 62 %
FVC-Post: 3.08 L
FVC-Pre: 3.03 L
Post FEV1/FVC ratio: 84 %
Post FEV6/FVC ratio: 100 %
Pre FEV1/FVC ratio: 85 %
Pre FEV6/FVC Ratio: 99 %
RV % pred: 76 %
RV: 1.69 L
TLC % pred: 75 %
TLC: 5.25 L

## 2020-08-21 MED ORDER — ALBUTEROL SULFATE HFA 108 (90 BASE) MCG/ACT IN AERS
2.0000 | INHALATION_SPRAY | RESPIRATORY_TRACT | 6 refills | Status: DC | PRN
Start: 2020-08-21 — End: 2024-05-22

## 2020-08-21 MED ORDER — LORATADINE 10 MG PO TABS
10.0000 mg | ORAL_TABLET | Freq: Every day | ORAL | 11 refills | Status: DC
Start: 2020-08-21 — End: 2021-08-07

## 2020-08-21 NOTE — Addendum Note (Signed)
Addended by: Gavin Potters R on: 08/21/2020 03:05 PM   Modules accepted: Orders

## 2020-08-21 NOTE — Assessment & Plan Note (Signed)
Working on getting him new equipment with AutoSet mode 8-20 cmH2O.  This is pending.  He has good compliance with his current device.

## 2020-08-21 NOTE — Assessment & Plan Note (Signed)
Improved since his omeprazole was increased.  He still has some breakthrough cough and throat tightness.  Continue 40 mg twice a day omeprazole

## 2020-08-21 NOTE — Patient Instructions (Addendum)
Continue your CPAP every night.  We ordered you new equipment with auto titration mode. We will try using albuterol 2 puffs up to every 4 hours if needed for chest tightness, wheezing, shortness of breath.  Keep track of whether this medication helps you Continue omeprazole 40 mg twice a day. Start loratadine 10 mg once daily. Follow with Dr Lamonte Sakai in 6 months or sooner if you have any problems

## 2020-08-21 NOTE — Progress Notes (Signed)
Subjective:    Patient ID: Willie [name], male    DOB: [birth year], 59 y.o.   MRN: [id number]  HPI 59 year old never smoker with a history diabetes, hypertension, hypothyroidism.  Has been followed in our office by Dr. Gwenette Greet in the past for obstructive sleep apnea and sarcoidosis that was diagnosed by lymph nodal biopsies (EBUS) 12/17/2010. Willie [name] reports that Willie [name] is coughing a lot, especially at night, can be productive of white thick. Has been more problematic over about 6 months. Willie [name] can have some chest tightness when Willie [name] is supine. Willie [name] has significant exertional SOB.  Has never been on BD's. Has been on pred before for ortho pain, etc. Not for sarcoidosis. Has lost some wt over the last year.  Willie [name] is on omeprazole, seems to control his GERD sx, ? Some breakthrough. Willie [name] has good CPAP compliance - gets good benefit.   Most recent available chest imaging is a chest x-ray 12/15/2010 that showed bilateral hilar fullness and mediastinal lymphadenopathy without any other clear infiltrates.  PFT done 01/06/2011 reviewed by me, show principally restriction, possible mixed obstruction, normal DLCO  ROV 08/21/20 --follow-up visit for 59 year old man with hypertension, diabetes, sarcoidosis and obstructive sleep apnea.  I saw him in July and Willie [name] was having progressive dyspnea as well as cough in the setting of GERD.  I increased his omeprazole to twice a day last time - may have helped the cough some.  We also ordered him a new CPAP machine since his was over 51 years old. Pulmonary function testing done today reviewed by me, shows principally restriction with possible coexisting obstruction on spirometry without a bronchodilator response.  His lung volumes are restricted.  Diffusion capacity is normal.  Still having some throat tightness, cough. No real GERD now, maybe some PND and allergy sx.   CT scan of the chest done on 07/09/2020 reviewed by me, shows no evidence of active sarcoidosis.  There is some right middle  lobe postinflammatory scarring without any overt ILD or mediastinal lymphadenopathy.   Review of Systems As per HPI     Objective:   Physical Exam  Vitals:   08/21/20 1359  BP: 124/76  Pulse: 90  Temp: 98 F (36.7 C)  TempSrc: Temporal  SpO2: 98%  Weight: 218 lb 3.2 oz (99 kg)  Height: 5\' 10"  (1.778 m)   Gen: Pleasant, overwt man, in no distress,  normal affect  ENT: No lesions,  mouth clear,  oropharynx clear, narrow posterior pharynx, no postnasal drip  Neck: No JVD, no stridor  Lungs: No use of accessory muscles, no crackles or wheezing on normal respiration, no wheeze on forced expiration  Cardiovascular: RRR, heart sounds normal, no murmur or gallops, no peripheral edema  Musculoskeletal: No deformities, no cyanosis or clubbing  Neuro: alert, awake, non focal  Skin: Warm, no lesions or rash      Assessment & Plan:  OBSTRUCTIVE SLEEP APNEA Working on getting him new equipment with AutoSet mode 8-20 cmH2O.  This is pending.  Willie [name] has good compliance with his current device.  G E R D Improved since his omeprazole was increased.  Willie [name] still has some breakthrough cough and throat tightness.  Continue 40 mg twice a day omeprazole  PULMONARY SARCOIDOSIS No evidence for active disease on his CT chest.  Some mild right middle lobe scar but no overt ILD.  No mediastinal lymphadenopathy or groundglass.  Pulmonary function testing show mixed disease.  There may be some room to benefit him with albuterol  and we will do a therapeutic trial.  See if this helps with his upper airway tightness, chest tightness.  Baltazar Apo, MD, PhD 08/21/2020, 2:30 PM [facility] [phone] or if no answer [phone]

## 2020-08-21 NOTE — Progress Notes (Signed)
PFT done today. 

## 2020-08-21 NOTE — Assessment & Plan Note (Signed)
No evidence for active disease on his CT chest.  Some mild right middle lobe scar but no overt ILD.  No mediastinal lymphadenopathy or groundglass.  Pulmonary function testing show mixed disease.  There may be some room to benefit him with albuterol and we will do a therapeutic trial.  See if this helps with his upper airway tightness, chest tightness.

## 2020-08-26 ENCOUNTER — Telehealth: Payer: Self-pay | Admitting: Emergency Medicine

## 2020-08-26 MED ORDER — OMEPRAZOLE 40 MG PO CPDR: DELAYED_RELEASE_CAPSULE | ORAL | 11 refills | Status: DC

## 2020-08-26 NOTE — Telephone Encounter (Signed)
Need to call Lenna Sciara- [facility] [phone] it is after 5 to see if they still need any information on this patient as far as settings or sleep study

## 2020-08-26 NOTE — Telephone Encounter (Signed)
Spoke with the pt  He is needing refill on omeprazole  Rx was sent to pharm  Nothing further needed

## 2020-09-05 NOTE — Telephone Encounter (Signed)
Pt just had an OV with RB 08/21/20. Nothing further needed.

## 2021-04-21 ENCOUNTER — Ambulatory Visit: Payer: Medicare Other | Admitting: Emergency Medicine

## 2021-04-21 ENCOUNTER — Other Ambulatory Visit: Payer: Self-pay

## 2021-04-21 ENCOUNTER — Encounter: Payer: Self-pay | Admitting: Emergency Medicine

## 2021-04-21 DIAGNOSIS — R059 Cough, unspecified: Secondary | ICD-10-CM | POA: Diagnosis not present

## 2021-04-21 DIAGNOSIS — D869 Sarcoidosis, unspecified: Secondary | ICD-10-CM | POA: Diagnosis not present

## 2021-04-21 DIAGNOSIS — K219 Gastro-esophageal reflux disease without esophagitis: Secondary | ICD-10-CM | POA: Diagnosis not present

## 2021-04-21 DIAGNOSIS — G4733 Obstructive sleep apnea (adult) (pediatric): Secondary | ICD-10-CM

## 2021-04-21 NOTE — Progress Notes (Signed)
   Subjective:    Patient ID: Willie [name], male    DOB: Mar 08, 1961, 60 y.o.   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  Also with a history of hypertension, diabetes, OSA on CPAP, GERD.  We have been working to try to get him new CPAP but has been having trouble, received the wrong headgear, equipment. Poor service from [employer], needs a larger size nasal pillows.  Today he reports that he feels better, is doing well.  At our last visit I tried starting albuterol as needed to see if he would get benefit.  He reports that he has benefited from it - has used it for chest tightness in the evening,   Remains on omeprazole 40 mg twice daily We started loratadine 10 mg once daily at his last visit. He is benefiting    Review of Systems As per HPI     Objective:   Physical Exam  Vitals:   04/21/21 1341  BP: 122/74  Pulse: 85  Temp: 98.1 F (36.7 C)  TempSrc: Temporal  SpO2: 98%  Weight: 218 lb (98.9 kg)  Height: 5\' 10"  (1.778 m)   Gen: Pleasant, overwt man, in no distress,  normal affect  ENT: No lesions,  mouth clear,  oropharynx clear, narrow posterior pharynx, no postnasal drip  Neck: No JVD, no stridor  Lungs: No use of accessory muscles, no crackles or wheezing on normal respiration, no wheeze on forced expiration  Cardiovascular: RRR, heart sounds normal, no murmur or gallops, no peripheral edema  Musculoskeletal: No deformities, no cyanosis or clubbing  Neuro: alert, awake, non focal  Skin: Warm, no lesions or rash      Assessment & Plan:  OBSTRUCTIVE SLEEP APNEA He was able to get a new machine.  He got the wrong headgear but he is working with the mass that he currently has.  He had to order nasal pillows himself off [employer].  Plan to continue his CPAP.  He has good compliance and good clinical benefit, less daytime sleepiness, more  energy, better sleep quality.  G E R D He benefited from the increase in omeprazole last time.  His upper airway irritation is improved, GERD is improved.  We will try going back down to 20 mg twice a day.  If he tolerates continue it.  If he has recurrent symptoms then we will go back to 40 mg twice a day and stay at that dose.  COUGH Treating GERD as above and also allergic rhinitis.  Continue loratadine.  He has benefited.  PULMONARY SARCOIDOSIS With restriction, possible mild obstruction.  He did seem to get some benefit from the albuterol.  I do not think he needs scheduled bronchodilator but he can continue the albuterol as needed.  He needs a repeat chest x-ray next time.  We can determine the timing of any repeat CT chest depending on how he is doing, his x-ray results.  Did discuss with him to be watchful for any rash, breathing changes.  Reminded him that he needs an annual ophthalmology exam.  Baltazar Apo, MD, PhD 04/21/2021, 2:11 PM [facility] [phone] or if no answer [phone]

## 2021-04-21 NOTE — Assessment & Plan Note (Signed)
Treating GERD as above and also allergic rhinitis.  Continue loratadine.  He has benefited.

## 2021-04-21 NOTE — Assessment & Plan Note (Signed)
He was able to get a new machine.  He got the wrong headgear but he is working with the mass that he currently has.  He had to order nasal pillows himself off [employer].  Plan to continue his CPAP.  He has good compliance and good clinical benefit, less daytime sleepiness, more energy, better sleep quality.

## 2021-04-21 NOTE — Assessment & Plan Note (Signed)
With restriction, possible mild obstruction.  He did seem to get some benefit from the albuterol.  I do not think he needs scheduled bronchodilator but he can continue the albuterol as needed.  He needs a repeat chest x-ray next time.  We can determine the timing of any repeat CT chest depending on how he is doing, his x-ray results.  Did discuss with him to be watchful for any rash, breathing changes.  Reminded him that he needs an annual ophthalmology exam.

## 2021-04-21 NOTE — Patient Instructions (Addendum)
Continue to wear your CPAP reliably every night Keep albuterol available to use 2 puffs when needed for shortness of breath, chest tightness, wheezing. Continue your loratadine 10 mg once daily. Decrease omeprazole to 20 mg twice a day.  If you tolerate this, do not have any recurrence of reflux or throat irritation then you can stay at this dose.  If you notice a decline then please go back to 40 mg twice a day. Follow-up in 1 year with a chest x-ray at that time.  Call sooner if you have any problems

## 2021-04-21 NOTE — Assessment & Plan Note (Signed)
He benefited from the increase in omeprazole last time.  His upper airway irritation is improved, GERD is improved.  We will try going back down to 20 mg twice a day.  If he tolerates continue it.  If he has recurrent symptoms then we will go back to 40 mg twice a day and stay at that dose.

## 2021-06-15 ENCOUNTER — Telehealth: Payer: Self-pay | Admitting: Emergency Medicine

## 2021-06-15 DIAGNOSIS — G4733 Obstructive sleep apnea (adult) (pediatric): Secondary | ICD-10-CM

## 2021-06-15 NOTE — Telephone Encounter (Addendum)
I called and spoke to pt's wife.  She is interested in having a new sleep study ordered so cpap can be run back thru insurance.  CPAP they had was turned in yesterday due to they were going to have to pay due to non-compliance.  I discussed with her that pt should let us know if he is having issues with equipment in the future.  Told her I would route to triage so nurse can check with RB & see if new study can be ordered.

## 2021-06-15 NOTE — Telephone Encounter (Signed)
Called patient's wife back but she did not answer. I wanted to make sure that he is not using O2 at night with his cpap.

## 2021-06-15 NOTE — Telephone Encounter (Signed)
Called and spoke with Patient and his wife Willie [name].  Patient stated he was using [facility] for cpap DME.  [facility] sent him a bill for $800 for being noncompliant.  Patient stated he used his cpap nightly, but was having to take it off for stuffy head feeling.  Willie [name] stated she talked with [facility] and was told account was turned over to collections after 1 bill.  Willie [name] stated [facility] did not send information to [facility] per representative Willie [name] spoke with. Patient turned cpap in at [facility] yesterday.  Debra asked about other DME companies and purchasing cpap themselves. Advised there are other DME companies they may be able to go through like [employer] or [employer].  Advised [email or web address] and [place] also have cpap machines.  Patient would  like a local DME company if possible.  Message routed to [facility] for assistance

## 2021-06-17 NOTE — Telephone Encounter (Signed)
Dr. Lamonte Sakai, please advise if you are ok with placing a new order for a sleep study to start the process over again. Pt's cpap was given back to [facility] due to non-compliance.   Dr. Lamonte Sakai, please advise. Thanks.

## 2021-06-17 NOTE — Telephone Encounter (Signed)
Yes, if that's what needs to happen. Sometimes we can use the prior PSG, but if he needs a new one then go ahead and order a split night study

## 2021-06-17 NOTE — Telephone Encounter (Signed)
Order has been placed to have the pt repeat the split night study so that he can get a new cpap from DME locally.

## 2021-08-06 ENCOUNTER — Other Ambulatory Visit: Payer: Self-pay | Admitting: Emergency Medicine

## 2021-08-23 ENCOUNTER — Ambulatory Visit (HOSPITAL_BASED_OUTPATIENT_CLINIC_OR_DEPARTMENT_OTHER): Payer: Medicare Other | Attending: Emergency Medicine | Admitting: Pulmonary Disease

## 2021-08-23 ENCOUNTER — Other Ambulatory Visit: Payer: Self-pay

## 2021-08-23 ENCOUNTER — Telehealth: Payer: Self-pay | Admitting: Pulmonary Disease

## 2021-08-23 DIAGNOSIS — G4733 Obstructive sleep apnea (adult) (pediatric): Secondary | ICD-10-CM

## 2021-08-23 NOTE — Telephone Encounter (Signed)
I have called wife and let her know that no PA req

## 2021-08-24 DIAGNOSIS — G4733 Obstructive sleep apnea (adult) (pediatric): Secondary | ICD-10-CM | POA: Diagnosis not present

## 2021-08-24 NOTE — Procedures (Signed)
     Patient Name: Willie [name], Willie [name] Date: 08/23/2021 Gender: Male D.O.B: [birth year] Age (years): 60 Referring Provider: Baltazar Apo Height (inches): 50 Interpreting Physician: Chesley Mires MD, ABSM Weight (lbs): 210 RPSGT: Gwenyth Allegra BMI: 30 MRN: [id number] Neck Size: 19.00  CLINICAL INFORMATION Sleep Study Type: Split Night CPAP  Indication for sleep study: Diabetes, Hypertension, OSA  Epworth Sleepiness Score: 7  SLEEP STUDY TECHNIQUE As per the AASM Manual for the Scoring of Sleep and Associated Events v2.3 (April 2016) with a hypopnea requiring 4% desaturations.  The channels recorded and monitored were frontal, central and occipital EEG, electrooculogram (EOG), submentalis EMG (chin), nasal and oral airflow, thoracic and abdominal wall motion, anterior tibialis EMG, snore microphone, electrocardiogram, and pulse oximetry. Continuous positive airway pressure (CPAP) was initiated when the patient met split night criteria and was titrated according to treat sleep-disordered breathing.  MEDICATIONS Medications self-administered by patient taken the night of the study : ATENOLOL, WELLBUTRIN SR, BUSPAR, CYCLOBENZAPRINE HCL, GABAPENTIN, SYNTHROID, OMEPRAZOLE, TRAMADOL, AMITRIPTYLINE, HUMALOG  RESPIRATORY PARAMETERS Diagnostic  Total AHI (/hr): 24.4 RDI (/hr): 29.9 OA Index (/hr): - CA Index (/hr): 0.0 REM AHI (/hr): N/A NREM AHI (/hr): 24.4 Supine AHI (/hr): 52.9 Non-supine AHI (/hr): 17.6 Min O2 Sat (%): 85.0 Mean O2 (%): 93.6 Time below 88% (min): 2.4   Titration  Optimal Pressure (cm): 12 AHI at Optimal Pressure (/hr): 3 Min O2 at Optimal Pressure (%): 86.0 Supine % at Optimal (%): 32 Sleep % at Optimal (%): 90   SLEEP ARCHITECTURE The recording time for the entire night was 385.7 minutes.  During a baseline period of 227.6 minutes, the patient slept for 130.5 minutes in REM and nonREM, yielding a sleep efficiency of 57.3%%. Sleep onset after lights out was  80.9 minutes with a REM latency of N/A minutes. The patient spent 36.4%% of the night in stage N1 sleep, 63.6%% in stage N2 sleep, 0.0%% in stage N3 and 0% in REM.  During the titration period of 157.1 minutes, the patient slept for 117.2 minutes in REM and nonREM, yielding a sleep efficiency of 74.6%%. Sleep onset after CPAP initiation was 14.9 minutes with a REM latency of 79.5 minutes. The patient spent 15.4%% of the night in stage N1 sleep, 60.3%% in stage N2 sleep, 0.0%% in stage N3 and 24.3% in REM.  CARDIAC DATA The 2 lead EKG demonstrated sinus rhythm. The mean heart rate was 100.0 beats per minute. Other EKG findings include: None.  LEG MOVEMENT DATA The total Periodic Limb Movements of Sleep (PLMS) were 0. The PLMS index was 0.0 .  IMPRESSIONS - Moderate obstructive sleep apnea with an AHI of 24.4 and SpO2 low of 85%. - He did well with CPAP 12 cm H2O. - He did not require supplemental oxygen during this study.  DIAGNOSIS - Obstructive Sleep Apnea (G47.33)  RECOMMENDATIONS - Trial of CPAP therapy on 12 cm H2O with a Medium size Fisher&Paykel Full Face Mask F&P Vitera (new) mask and heated humidification. - Avoid alcohol, sedatives and other CNS depressants that may worsen sleep apnea and disrupt normal sleep architecture. - Sleep hygiene should be reviewed to assess factors that may improve sleep quality. - Weight management and regular exercise should be initiated or continued.  [Electronically signed] 08/24/2021 09:13 AM  Chesley Mires MD, ABSM Diplomate, American Board of Sleep Medicine NPI: [id number]  [place] PH: [phone]   FX: [phone] [place]

## 2021-10-04 ENCOUNTER — Other Ambulatory Visit: Payer: Self-pay | Admitting: Emergency Medicine

## 2022-08-23 ENCOUNTER — Other Ambulatory Visit: Payer: Self-pay | Admitting: Emergency Medicine

## 2022-11-07 ENCOUNTER — Other Ambulatory Visit: Payer: Self-pay | Admitting: Emergency Medicine

## 2024-03-22 ENCOUNTER — Other Ambulatory Visit: Payer: Self-pay | Admitting: Student

## 2024-03-22 DIAGNOSIS — M5412 Radiculopathy, cervical region: Secondary | ICD-10-CM

## 2024-03-22 DIAGNOSIS — M5416 Radiculopathy, lumbar region: Secondary | ICD-10-CM

## 2024-03-25 ENCOUNTER — Ambulatory Visit
Admission: RE | Admit: 2024-03-25 | Discharge: 2024-03-25 | Disposition: A | Discharge status: Home / Self Care | Source: Ambulatory Visit | Attending: Student

## 2024-03-25 DIAGNOSIS — M5416 Radiculopathy, lumbar region: Secondary | ICD-10-CM

## 2024-03-25 DIAGNOSIS — M5412 Radiculopathy, cervical region: Secondary | ICD-10-CM

## 2024-04-08 ENCOUNTER — Other Ambulatory Visit: Payer: Self-pay | Admitting: Neurological Surgery

## 2024-04-15 NOTE — Pre-Procedure Instructions (Addendum)
 Surgical Instructions   Your procedure is scheduled on April 22, 2024. Report to [facility] Main Entrance "A" at 7:45 A.M., then check in with the Admitting office. Any questions or running late day of surgery: call [phone]  Questions prior to your surgery date: call [phone], Monday-Friday, 8am-4pm. If you experience any cold or flu symptoms such as cough, fever, chills, shortness of breath, etc. between now and your scheduled surgery, please notify us  at the above number.     Remember:  Do not eat or drink after midnight the night before your surgery    Take these medicines the morning of surgery with A SIP OF WATER : amitriptyline  (ELAVIL )  buPROPion  (WELLBUTRIN  SR)  busPIRone  (BUSPAR )  cyclobenzaprine  (FLEXERIL )  ezetimibe  (ZETIA )  gabapentin  (NEURONTIN )  levothyroxine  (SYNTHROID )  omeprazole  (PRILOSEC)    May take these medicines IF NEEDED: albuterol  (VENTOLIN  HFA) inhaler - please bring inhaler with you morning of surgery tiZANidine  (ZANAFLEX )  traMADol (ULTRAM)    One week prior to surgery, STOP taking any Aspirin (unless otherwise instructed by your surgeon) Aleve, Naproxen, Ibuprofen, Motrin, Advil, Goody's, BC's, all herbal medications, fish oil, and non-prescription vitamins.   WHAT DO I DO ABOUT MY DIABETES MEDICATION?   INSULIN  PUMP INSTRUCTIONS: Decrease all basal insulin  rates by 20% at midnight prior to surgery OR contact PCP/Endocrinologist for pump orders  If your CBG is greater than 220 mg/dL, you may take  of your insulin  regular (NOVOLIN R).  Continue taking your Insulin  NPH, Human,, Isophane, (NOVOLIN N FLEXPEN) as normal through the night before surgery. If blood sugar is high morning of surgery, please call [phone] and ask for NPH instructions if needed.   HOW TO MANAGE YOUR DIABETES BEFORE AND AFTER SURGERY  Why is it important to control my blood sugar before and after surgery? Improving blood sugar levels before and after  surgery helps healing and can limit problems. A way of improving blood sugar control is eating a healthy diet by:  Eating less sugar and carbohydrates  Increasing activity/exercise  Talking with your doctor about reaching your blood sugar goals High blood sugars (greater than 180 mg/dL) can raise your risk of infections and slow your recovery, so you will need to focus on controlling your diabetes during the weeks before surgery. Make sure that the doctor who takes care of your diabetes knows about your planned surgery including the date and location.  How do I manage my blood sugar before surgery? Check your blood sugar at least 4 times a day, starting 2 days before surgery, to make sure that the level is not too high or low.  Check your blood sugar the morning of your surgery when you wake up and every 2 hours until you get to the Short Stay unit.  If your blood sugar is less than 70 mg/dL, you will need to treat for low blood sugar: Do not take insulin . Treat a low blood sugar (less than 70 mg/dL) with  cup of clear juice (cranberry or apple), 4 glucose tablets, OR glucose gel. Recheck blood sugar in 15 minutes after treatment (to make sure it is greater than 70 mg/dL). If your blood sugar is not greater than 70 mg/dL on recheck, call [phone] for further instructions. Report your blood sugar to the short stay nurse when you get to Short Stay.  If you are admitted to the hospital after surgery: Your blood sugar will be checked by the staff and you will probably be given insulin  after surgery (instead of  oral diabetes medicines) to make sure you have good blood sugar levels. The goal for blood sugar control after surgery is 80-180 mg/dL.                      Do NOT Smoke (Tobacco/Vaping) for 24 hours prior to your procedure.  If you use a CPAP at night, you may bring your mask/headgear for your overnight stay.   You will be asked to remove any contacts, glasses, piercing's,  hearing aid's, dentures/partials prior to surgery. Please bring cases for these items if needed.    Patients discharged the day of surgery will not be allowed to drive home, and someone needs to stay with them for 24 hours.  SURGICAL WAITING ROOM VISITATION Patients may have no more than 2 support people in the waiting area - these visitors may rotate.   Pre-op nurse will coordinate an appropriate time for 1 ADULT support person, who may not rotate, to accompany patient in pre-op.  Children under the age of 34 must have an adult with them who is not the patient and must remain in the main waiting area with an adult.  If the patient needs to stay at the hospital during part of their recovery, the visitor guidelines for inpatient rooms apply.  Please refer to the [facility] website for the visitor guidelines for any additional information.   If you received a COVID test during your pre-op visit  it is requested that you wear a mask when out in public, stay away from anyone that may not be feeling well and notify your surgeon if you develop symptoms. If you have been in contact with anyone that has tested positive in the last 10 days please notify you surgeon.      Pre-operative 5 CHG Bathing Instructions   You can play a key role in reducing the risk of infection after surgery. Your skin needs to be as free of germs as possible. You can reduce the number of germs on your skin by washing with CHG (chlorhexidine gluconate) soap before surgery. CHG is an antiseptic soap that kills germs and continues to kill germs even after washing.   DO NOT use if you have an allergy to chlorhexidine/CHG or antibacterial soaps. If your skin becomes reddened or irritated, stop using the CHG and notify one of our RNs at [phone].   Please shower with the CHG soap starting 4 days before surgery using the following schedule:     Please keep in mind the following:  DO NOT shave, including legs and  underarms, starting the day of your first shower.   You may shave your face at any point before/day of surgery.  Place clean sheets on your bed the day you start using CHG soap. Use a clean washcloth (not used since being washed) for each shower. DO NOT sleep with pets once you start using the CHG.   CHG Shower Instructions:  Wash your face and private area with normal soap. If you choose to wash your hair, wash first with your normal shampoo.  After you use shampoo/soap, rinse your hair and body thoroughly to remove shampoo/soap residue.  Turn the water OFF and apply about 3 tablespoons (45 ml) of CHG soap to a CLEAN washcloth.  Apply CHG soap ONLY FROM YOUR NECK DOWN TO YOUR TOES (washing for 3-5 minutes)  DO NOT use CHG soap on face, private areas, open wounds, or sores.  Pay special attention to the area  where your surgery is being performed.  If you are having back surgery, having someone wash your back for you may be helpful. Wait 2 minutes after CHG soap is applied, then you may rinse off the CHG soap.  Pat dry with a clean towel  Put on clean clothes/pajamas   If you choose to wear lotion, please use ONLY the CHG-compatible lotions that are listed below.  Additional instructions for the day of surgery: DO NOT APPLY any lotions, deodorants, cologne, or perfumes.   Do not bring valuables to the hospital. [facility] is not responsible for any belongings/valuables. Do not wear nail polish, gel polish, artificial nails, or any other type of covering on natural nails (fingers and toes) Do not wear jewelry or makeup Put on clean/comfortable clothes.  Please brush your teeth.  Ask your nurse before applying any prescription medications to the skin.     CHG Compatible Lotions   Aveeno Moisturizing lotion  Cetaphil Moisturizing Cream  Cetaphil Moisturizing Lotion  Clairol Herbal Essence Moisturizing Lotion, Dry Skin  Clairol Herbal Essence Moisturizing Lotion, Extra Dry Skin   Clairol Herbal Essence Moisturizing Lotion, Normal Skin  Curel Age Defying Therapeutic Moisturizing Lotion with Alpha Hydroxy  Curel Extreme Care Body Lotion  Curel Soothing Hands Moisturizing Hand Lotion  Curel Therapeutic Moisturizing Cream, Fragrance-Free  Curel Therapeutic Moisturizing Lotion, Fragrance-Free  Curel Therapeutic Moisturizing Lotion, Original Formula  Eucerin Daily Replenishing Lotion  Eucerin Dry Skin Therapy Plus Alpha Hydroxy Crme  Eucerin Dry Skin Therapy Plus Alpha Hydroxy Lotion  Eucerin Original Crme  Eucerin Original Lotion  Eucerin Plus Crme Eucerin Plus Lotion  Eucerin TriLipid Replenishing Lotion  Keri Anti-Bacterial Hand Lotion  Keri Deep Conditioning Original Lotion Dry Skin Formula Softly Scented  Keri Deep Conditioning Original Lotion, Fragrance Free Sensitive Skin Formula  Keri Lotion Fast Absorbing Fragrance Free Sensitive Skin Formula  Keri Lotion Fast Absorbing Softly Scented Dry Skin Formula  Keri Original Lotion  Keri Skin Renewal Lotion Keri Silky Smooth Lotion  Keri Silky Smooth Sensitive Skin Lotion  Nivea Body Creamy Conditioning Oil  Nivea Body Extra Enriched Lotion  Nivea Body Original Lotion  Nivea Body Sheer Moisturizing Lotion Nivea Crme  Nivea Skin Firming Lotion  NutraDerm 30 Skin Lotion  NutraDerm Skin Lotion  NutraDerm Therapeutic Skin Cream  NutraDerm Therapeutic Skin Lotion  ProShield Protective Hand Cream  Provon moisturizing lotion  Please read over the following fact sheets that you were given.

## 2024-04-16 ENCOUNTER — Other Ambulatory Visit: Payer: Self-pay

## 2024-04-16 ENCOUNTER — Encounter (HOSPITAL_COMMUNITY): Payer: Self-pay

## 2024-04-16 ENCOUNTER — Encounter (HOSPITAL_COMMUNITY)
Admission: RE | Admit: 2024-04-16 | Discharge: 2024-04-16 | Disposition: A | Discharge status: Home / Self Care | Source: Ambulatory Visit | Attending: Neurological Surgery | Admitting: Neurological Surgery

## 2024-04-16 VITALS — BP 144/82 | HR 87 | Temp 98.4°F | Resp 18 | Ht 69.0 in | Wt 214.5 lb

## 2024-04-16 DIAGNOSIS — Z981 Arthrodesis status: Secondary | ICD-10-CM | POA: Insufficient documentation

## 2024-04-16 DIAGNOSIS — E785 Hyperlipidemia, unspecified: Secondary | ICD-10-CM | POA: Insufficient documentation

## 2024-04-16 DIAGNOSIS — Z01812 Encounter for preprocedural laboratory examination: Secondary | ICD-10-CM | POA: Diagnosis present

## 2024-04-16 DIAGNOSIS — R9431 Abnormal electrocardiogram [ECG] [EKG]: Secondary | ICD-10-CM | POA: Diagnosis not present

## 2024-04-16 DIAGNOSIS — E109 Type 1 diabetes mellitus without complications: Secondary | ICD-10-CM

## 2024-04-16 DIAGNOSIS — M5412 Radiculopathy, cervical region: Secondary | ICD-10-CM | POA: Insufficient documentation

## 2024-04-16 DIAGNOSIS — E039 Hypothyroidism, unspecified: Secondary | ICD-10-CM | POA: Insufficient documentation

## 2024-04-16 DIAGNOSIS — M797 Fibromyalgia: Secondary | ICD-10-CM | POA: Insufficient documentation

## 2024-04-16 DIAGNOSIS — N400 Enlarged prostate without lower urinary tract symptoms: Secondary | ICD-10-CM | POA: Insufficient documentation

## 2024-04-16 DIAGNOSIS — I10 Essential (primary) hypertension: Secondary | ICD-10-CM | POA: Insufficient documentation

## 2024-04-16 DIAGNOSIS — Z79899 Other long term (current) drug therapy: Secondary | ICD-10-CM | POA: Insufficient documentation

## 2024-04-16 DIAGNOSIS — K769 Liver disease, unspecified: Secondary | ICD-10-CM | POA: Diagnosis not present

## 2024-04-16 DIAGNOSIS — G4733 Obstructive sleep apnea (adult) (pediatric): Secondary | ICD-10-CM | POA: Insufficient documentation

## 2024-04-16 DIAGNOSIS — E10319 Type 1 diabetes mellitus with unspecified diabetic retinopathy without macular edema: Secondary | ICD-10-CM | POA: Diagnosis not present

## 2024-04-16 DIAGNOSIS — Z01818 Encounter for other preprocedural examination: Secondary | ICD-10-CM | POA: Diagnosis not present

## 2024-04-16 DIAGNOSIS — E1040 Type 1 diabetes mellitus with diabetic neuropathy, unspecified: Secondary | ICD-10-CM | POA: Diagnosis not present

## 2024-04-16 DIAGNOSIS — Z01810 Encounter for preprocedural cardiovascular examination: Secondary | ICD-10-CM | POA: Diagnosis present

## 2024-04-16 HISTORY — DX: Anxiety disorder, unspecified: F41.9

## 2024-04-16 HISTORY — DX: Unspecified osteoarthritis, unspecified site: M19.90

## 2024-04-16 HISTORY — DX: Personal history of urinary calculi: Z87.442

## 2024-04-16 HISTORY — DX: Fibromyalgia: M79.7

## 2024-04-16 HISTORY — DX: Depression, unspecified: F32.A

## 2024-04-16 LAB — TYPE AND SCREEN
ABO/RH(D): A NEG
Antibody Screen: NEGATIVE

## 2024-04-16 LAB — SURGICAL PCR SCREEN
MRSA, PCR: NEGATIVE
Staphylococcus aureus: NEGATIVE

## 2024-04-16 LAB — CBC
HCT: 48.5 % (ref 39.0–52.0)
Hemoglobin: 16.3 g/dL (ref 13.0–17.0)
MCH: 29.6 pg (ref 26.0–34.0)
MCHC: 33.6 g/dL (ref 30.0–36.0)
MCV: 88.2 fL (ref 80.0–100.0)
Platelets: 250 10*3/uL (ref 150–400)
RBC: 5.5 MIL/uL (ref 4.22–5.81)
RDW: 12.6 % (ref 11.5–15.5)
WBC: 7.3 10*3/uL (ref 4.0–10.5)
nRBC: 0 % (ref 0.0–0.2)

## 2024-04-16 LAB — COMPREHENSIVE METABOLIC PANEL WITH GFR
ALT: 54 U/L — ABNORMAL HIGH (ref 0–44)
AST: 36 U/L (ref 15–41)
Albumin: 4 g/dL (ref 3.5–5.0)
Alkaline Phosphatase: 53 U/L (ref 38–126)
Anion gap: 8 (ref 5–15)
BUN: 7 mg/dL — ABNORMAL LOW (ref 8–23)
CO2: 31 mmol/L (ref 22–32)
Calcium: 9.4 mg/dL (ref 8.9–10.3)
Chloride: 98 mmol/L (ref 98–111)
Creatinine, Ser: 0.89 mg/dL (ref 0.61–1.24)
GFR, Estimated: >60 mL/min (ref >60)
Glucose, Bld: 70 mg/dL (ref 70–99)
Potassium: 3.6 mmol/L (ref 3.5–5.1)
Sodium: 137 mmol/L (ref 135–145)
Total Bilirubin: 0.7 mg/dL (ref 0.0–1.2)
Total Protein: 7.3 g/dL (ref 6.5–8.1)

## 2024-04-16 LAB — PROTIME-INR
INR: 1 (ref 0.8–1.2)
Prothrombin Time: 13.3 s (ref 11.4–15.2)

## 2024-04-16 LAB — GLUCOSE, CAPILLARY: Glucose-Capillary: 106 mg/dL — ABNORMAL HIGH (ref 70–99)

## 2024-04-16 LAB — HEMOGLOBIN A1C
Hgb A1c MFr Bld: 7.5 % — ABNORMAL HIGH (ref 4.8–5.6)
Mean Plasma Glucose: 168.55 mg/dL

## 2024-04-16 NOTE — Progress Notes (Addendum)
 PCP - Dr. Albert Huff Cardiologist - Denies - Saw Dr. Julane Ny around 2009 for increased HR. Work-up normal and no follow-up needed  PPM/ICD - Denies Device Orders - n/a Rep Notified - n/a  Chest x-ray - n/a EKG - 04/16/2024 Stress Test - Per pt, he was required to have an annual stress test while working for [employer]. All testing was normal. ECHO - 05/09/2008 Cardiac Cath - Denies  Sleep Study - +OSA but pt does not wear CPAP. He stopped wearing it when office wanted to charge him for times he was not compliant with using machine.   Pt is DM1. He has an insulin pump and checks his blood sugar multiple times per day. Normal fasting blood sugar is around 100. CBG at pre-op appointment 106. A1c result pending. Pt takes NPH sliding scale for blood sugars over 140, which usually occurs at bedtime. Discussed with Rudy Costain, PA-C who instructed pt to take his normal amount of NPH the night before surgery. If pt were to have high glucose levels morning of surgery, he was instructed to take half dose of regular insulin if CBG is greater than 220 and, if needed, call Short Stay desk to inquire if he should take any NPH. Pt understood all instructions.  Last dose of GLP1 agonist- n/a GLP1 instructions: n/a  Blood Thinner Instructions: n/a Aspirin Instructions: n/a  NPO after midnight  COVID TEST- n/a   Anesthesia review: Yes. EKG review. Hx of HTN, OSA (no CPAP), DM1 on insulin pump, fatty liver.  Patient denies shortness of breath, fever, cough and chest pain at PAT appointment. Pt denies any respiratory illness/infection in the last two months.    All instructions explained to the patient, with a verbal understanding of the material. Patient agrees to go over the instructions while at home for a better understanding. Patient also instructed to self quarantine after being tested for COVID-19. The opportunity to ask questions was provided.

## 2024-04-17 NOTE — Progress Notes (Signed)
 Anesthesia Chart Review:  Case: [id number] Date/Time: 04/22/24 0930   Procedure: ANTERIOR CERVICAL DECOMPRESSION/DISCECTOMY FUSION 3 LEVEL/HARDWARE REMOVAL - ACDF - C3-C4 - C4-C5 - C5-C6, remove tether plate I3-4   Anesthesia type: General   Diagnosis: Radiculopathy of cervical region [M54.12]   Pre-op diagnosis: Radiculopathy of cervical region   Location: [facility] ROOM 20 / [facility] OR   Surgeons: Joaquin Mulberry, MD       DISCUSSION: Patient is a 63 year old male scheduled for the above procedure.   History includes never smoker, HTN, HLD, DM1 (with neuropathy, retinopathy), hypothyroidism, OSA (not wearing CPAP), melanoma (2016), BPH, fibromyalgia, tremor, spinal surgery (C6-7 ACDF 03/02/04). 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   Preprocedure flexible laryngoscopy by Sharren Decree, DO on 04/01/24 showed:  4 mm flexible laryngoscope was passed through the nasal cavity without difficulty. Flexible laryngoscopy shows patent anterior nasal cavity with minimal crusting, no discharge or infection. Normal base of tongue and supraglottis. Normal vocal cord mobility without vocal cord nodule, mass, polyp or tumor. Hypopharynx normal without mass, pooling of secretions or  aspiration.   A1c 7.5% on 04/16/24. CBC, PT/INR and CMP normal other than minimally elevated ALT of 54. EKG showed NSR, LAD, non-specific intra-ventricular conduction block which appears overall stable when compared to prior.  He had unremarkable cardiac testing in 2009. He denied chest pain and SOB per PAT RN interview.   Anesthesia team to evaluate on the day of surgery.    VS: BP (!) 144/82   Pulse 87   Temp 36.9 C   Resp 18   Ht 5\' 9"  (1.753 m)   Wt 97.3 kg   SpO2 98%   BMI 31.68 kg/m   PROVIDERS: Rosslyn Coons, MD is PCP  - Last pulmonology follow-up  with Racheal Buddle, MD wason 04/21/21 for follow-up pulmonary Sarcoidosis, OSA, cough, GERD.  - As needed cardiology follow-up with Bensimhon, Daniel, MD per 05/21/08 visit after reassuring testing for resting tachycardia. - He has a new patient neurology appointment scheduled for 05/23/23 with Aldona Amel, MD for memory changes.    LABS: Labs reviewed: Acceptable for surgery. (all labs ordered are listed, but only abnormal results are displayed)  Labs Reviewed  GLUCOSE, CAPILLARY - Abnormal; Notable for the following components:      Result Value   Glucose-Capillary 106 (*)    All other components within normal limits  HEMOGLOBIN A1C - Abnormal; Notable for the following components:   Hgb A1c MFr Bld 7.5 (*)    All other components within normal limits  COMPREHENSIVE METABOLIC PANEL WITH GFR - Abnormal; Notable for the following components:   BUN 7 (*)    ALT 54 (*)    All other components within normal limits  SURGICAL PCR SCREEN  PROTIME-INR  CBC  TYPE AND SCREEN    OTHER: Split Night Sleep Study 08/23/21: IMPRESSIONS - Moderate obstructive sleep apnea with an AHI of 24.4 and SpO2 low of 85%. - He did well with CPAP 12 cm H2O. - He did not require supplemental oxygen during this study.    IMAGES: MRI C-spine 03/25/24: IMPRESSION: 1. Postoperative changes at C6-7. No spinal or foraminal stenosis at this level. 2. Mild spinal and bilateral foraminal stenosis at C3-4. 3. Mild bilateral foraminal narrowing at C4-5. 4. Moderate spinal and bilateral foraminal stenosis at C5-6.   MRI L-spine 03/25/24: IMPRESSION:  1. Stable degenerative anterolisthesis L5. 2. Mild bilateral lateral recess encroachment at L3-4. 3. Stable small focal central disc protrusion at L4-5 but no spinal or foraminal stenosis.    EKG: EKG 04/16/24: Normal sinus rhythm Left axis deviation Non-specific intra-ventricular conduction block Abnormal ECG Confirmed by Jackquelyn Mass [phone]) on 04/16/2024  10:02:42 PM - He has comparison EKGs in [facility] from 02/27/04 and 12/15/10.  Overall, EKG does not appear significantly changes when compared to 2005 tracing. Currently tracing does he's some baselines "noise"/artifact.    CV: Echo 05/09/08: SUMMARY   -  Overall left ventricular systolic function was normal. Left         ventricular ejection fraction was estimated , range being 55         % to 60 %. There were no left ventricular regional wall         motion abnormalities.   Exercise Myoview 05/09/08:  "Exercise time 10 minutes. Maximum heart rate 162 beats per minute.  13.7 METS achieved.  ECG without acute changes.  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   Past Medical History:  Diagnosis Date   Anxiety    Arthritis    BPH (benign prostatic hyperplasia)    Depression    Diabetes (HCC)    Diabetic neuropathy (HCC)    Diabetic retinopathy (HCC)    Esophageal stricture    Fibromyalgia    History of kidney stones    Hyperlipidemia    Hypertension    Hypothyroidism    Melanoma (HCC)    OSA (obstructive sleep apnea)    Polyneuropathy    Tremor     Past Surgical History:  Procedure Laterality Date   CARPAL TUNNEL RELEASE Bilateral 2002   CATARACT EXTRACTION     CERVICAL DISC SURGERY  2005   CYST EXCISION  2002   middle finger   CYST EXCISION  2007   left thigh   ESOPHAGEAL DILATION     ROTATOR CUFF REPAIR Left    TRIGGER FINGER RELEASE Bilateral     MEDICATIONS:  albuterol  (VENTOLIN  HFA) 108 (90 Base) MCG/ACT inhaler   amitriptyline (ELAVIL) 50 MG tablet   Ascorbic Acid (VITAMIN C) 1000 MG tablet   atenolol (TENORMIN) 50 MG tablet   buPROPion (WELLBUTRIN SR) 150 MG 12 hr tablet   busPIRone (BUSPAR) 10 MG tablet   clobetasol (TEMOVATE) 0.05 % external solution   cyclobenzaprine (FLEXERIL) 10 MG tablet   ezetimibe (ZETIA) 10 MG tablet   gabapentin (NEURONTIN) 300 MG capsule   Insulin  NPH, Human,, Isophane, (NOVOLIN N FLEXPEN) 100 UNIT/ML Kiwkpen   insulin regular (NOVOLIN R) 100 units/mL injection   latanoprost (XALATAN) 0.005 % ophthalmic solution   levothyroxine (SYNTHROID) 112 MCG tablet   NOVOLOG 100 UNIT/ML injection   omeprazole  (PRILOSEC) 40 MG capsule   rosuvastatin (CRESTOR) 10 MG tablet   tiZANidine (ZANAFLEX) 4 MG tablet   traMADol (ULTRAM) 50 MG tablet   vitamin B-12 (CYANOCOBALAMIN) 1000 MCG tablet   No current facility-administered medications for this encounter.    Ella Gun, PA-C Surgical Short Stay/Anesthesiology [facility] Phone [phone] [facility] Phone [phone] 04/18/2024 4:30 PM

## 2024-04-18 NOTE — Anesthesia Preprocedure Evaluation (Addendum)
 Anesthesia Evaluation  Patient identified by MRN, date of birth, ID band Patient awake    Reviewed: Allergy & Precautions, NPO status , Patient's Chart, lab work & pertinent test results, reviewed documented beta blocker date and time   Airway Mallampati: IV  TM Distance: >3 FB Neck ROM: Limited    Dental  (+) Teeth Intact, Dental Advisory Given, Caps,    Pulmonary sleep apnea    Pulmonary exam normal breath sounds clear to auscultation       Cardiovascular hypertension, Pt. on home beta blockers Normal cardiovascular exam Rhythm:Regular Rate:Normal     Neuro/Psych  PSYCHIATRIC DISORDERS Anxiety Depression    Radiculopathy of cervical region   Neuromuscular disease    GI/Hepatic Neg liver ROS,GERD  Medicated,,  Endo/Other  diabetes (Insulin pump), Type 2, Insulin DependentHypothyroidism  Obesity   Renal/GU Renal InsufficiencyRenal disease     Musculoskeletal  (+) Arthritis ,  Fibromyalgia -  Abdominal   Peds  Hematology negative hematology ROS (+)   Anesthesia Other Findings   Reproductive/Obstetrics                              Anesthesia Physical Anesthesia Plan  ASA: 3  Anesthesia Plan: General   Post-op Pain Management: Tylenol PO (pre-op)* and Gabapentin PO (pre-op)*   Induction: Intravenous  PONV Risk Score and Plan: 2 and Midazolam, Ondansetron and Dexamethasone  Airway Management Planned: Oral ETT and Video Laryngoscope Planned  Additional Equipment:   Intra-op Plan:   Post-operative Plan: Extubation in OR  Informed Consent: I have reviewed the patients History and Physical, chart, labs and discussed the procedure including the risks, benefits and alternatives for the proposed anesthesia with the patient or authorized representative who has indicated his/her understanding and acceptance.     Dental advisory given  Plan Discussed with: CRNA  Anesthesia Plan  Comments: (PAT note written 04/18/2024 by Allison Zelenak, PA-C.  )        Anesthesia Quick Evaluation

## 2024-05-01 ENCOUNTER — Encounter (HOSPITAL_COMMUNITY)
Admission: RE | Disposition: A | Discharge status: Home / Self Care | Payer: Self-pay | Source: Home / Self Care | Attending: Neurological Surgery

## 2024-05-01 ENCOUNTER — Inpatient Hospital Stay (HOSPITAL_COMMUNITY)

## 2024-05-01 ENCOUNTER — Inpatient Hospital Stay (HOSPITAL_COMMUNITY): Payer: Self-pay | Admitting: Physician Assistant

## 2024-05-01 ENCOUNTER — Inpatient Hospital Stay (HOSPITAL_COMMUNITY)
Admission: RE | Admit: 2024-05-01 | Discharge: 2024-05-01 | DRG: 473 | Disposition: A | Discharge status: Home / Self Care | Attending: Neurological Surgery | Admitting: Neurological Surgery

## 2024-05-01 ENCOUNTER — Other Ambulatory Visit: Payer: Self-pay

## 2024-05-01 ENCOUNTER — Encounter (HOSPITAL_COMMUNITY): Payer: Self-pay | Admitting: Neurological Surgery

## 2024-05-01 ENCOUNTER — Inpatient Hospital Stay (HOSPITAL_COMMUNITY): Admitting: Certified Registered"

## 2024-05-01 DIAGNOSIS — Z85820 Personal history of malignant melanoma of skin: Secondary | ICD-10-CM | POA: Diagnosis not present

## 2024-05-01 DIAGNOSIS — I10 Essential (primary) hypertension: Secondary | ICD-10-CM | POA: Diagnosis present

## 2024-05-01 DIAGNOSIS — E785 Hyperlipidemia, unspecified: Secondary | ICD-10-CM | POA: Diagnosis present

## 2024-05-01 DIAGNOSIS — N400 Enlarged prostate without lower urinary tract symptoms: Secondary | ICD-10-CM | POA: Diagnosis present

## 2024-05-01 DIAGNOSIS — Z887 Allergy status to serum and vaccine status: Secondary | ICD-10-CM

## 2024-05-01 DIAGNOSIS — E11319 Type 2 diabetes mellitus with unspecified diabetic retinopathy without macular edema: Secondary | ICD-10-CM | POA: Diagnosis present

## 2024-05-01 DIAGNOSIS — M50221 Other cervical disc displacement at C4-C5 level: Secondary | ICD-10-CM | POA: Diagnosis present

## 2024-05-01 DIAGNOSIS — M50222 Other cervical disc displacement at C5-C6 level: Secondary | ICD-10-CM | POA: Diagnosis present

## 2024-05-01 DIAGNOSIS — M1990 Unspecified osteoarthritis, unspecified site: Secondary | ICD-10-CM | POA: Diagnosis present

## 2024-05-01 DIAGNOSIS — Z87442 Personal history of urinary calculi: Secondary | ICD-10-CM | POA: Diagnosis not present

## 2024-05-01 DIAGNOSIS — E039 Hypothyroidism, unspecified: Secondary | ICD-10-CM | POA: Diagnosis present

## 2024-05-01 DIAGNOSIS — G4733 Obstructive sleep apnea (adult) (pediatric): Secondary | ICD-10-CM | POA: Diagnosis present

## 2024-05-01 DIAGNOSIS — E1142 Type 2 diabetes mellitus with diabetic polyneuropathy: Secondary | ICD-10-CM | POA: Diagnosis present

## 2024-05-01 DIAGNOSIS — M797 Fibromyalgia: Secondary | ICD-10-CM | POA: Diagnosis present

## 2024-05-01 DIAGNOSIS — E669 Obesity, unspecified: Secondary | ICD-10-CM | POA: Diagnosis present

## 2024-05-01 DIAGNOSIS — M47812 Spondylosis without myelopathy or radiculopathy, cervical region: Secondary | ICD-10-CM | POA: Diagnosis present

## 2024-05-01 DIAGNOSIS — Z6831 Body mass index (BMI) 31.0-31.9, adult: Secondary | ICD-10-CM

## 2024-05-01 DIAGNOSIS — Z79890 Hormone replacement therapy (postmenopausal): Secondary | ICD-10-CM | POA: Diagnosis not present

## 2024-05-01 DIAGNOSIS — K219 Gastro-esophageal reflux disease without esophagitis: Secondary | ICD-10-CM | POA: Diagnosis present

## 2024-05-01 DIAGNOSIS — M5412 Radiculopathy, cervical region: Secondary | ICD-10-CM

## 2024-05-01 DIAGNOSIS — Z981 Arthrodesis status: Principal | ICD-10-CM

## 2024-05-01 DIAGNOSIS — Z87891 Personal history of nicotine dependence: Secondary | ICD-10-CM | POA: Diagnosis not present

## 2024-05-01 DIAGNOSIS — M4802 Spinal stenosis, cervical region: Principal | ICD-10-CM | POA: Diagnosis present

## 2024-05-01 DIAGNOSIS — E119 Type 2 diabetes mellitus without complications: Secondary | ICD-10-CM

## 2024-05-01 DIAGNOSIS — Z794 Long term (current) use of insulin: Secondary | ICD-10-CM | POA: Diagnosis not present

## 2024-05-01 DIAGNOSIS — Z825 Family history of asthma and other chronic lower respiratory diseases: Secondary | ICD-10-CM

## 2024-05-01 DIAGNOSIS — E109 Type 1 diabetes mellitus without complications: Secondary | ICD-10-CM

## 2024-05-01 DIAGNOSIS — Z91040 Latex allergy status: Secondary | ICD-10-CM

## 2024-05-01 DIAGNOSIS — Z79899 Other long term (current) drug therapy: Secondary | ICD-10-CM

## 2024-05-01 DIAGNOSIS — Z8349 Family history of other endocrine, nutritional and metabolic diseases: Secondary | ICD-10-CM | POA: Diagnosis not present

## 2024-05-01 DIAGNOSIS — F32A Depression, unspecified: Secondary | ICD-10-CM | POA: Diagnosis present

## 2024-05-01 DIAGNOSIS — Z8249 Family history of ischemic heart disease and other diseases of the circulatory system: Secondary | ICD-10-CM

## 2024-05-01 DIAGNOSIS — Z9641 Presence of insulin pump (external) (internal): Secondary | ICD-10-CM | POA: Diagnosis present

## 2024-05-01 LAB — TYPE AND SCREEN
ABO/RH(D): A NEG
Antibody Screen: NEGATIVE

## 2024-05-01 LAB — GLUCOSE, CAPILLARY
Glucose-Capillary: 190 mg/dL — ABNORMAL HIGH (ref 70–99)
Glucose-Capillary: 243 mg/dL — ABNORMAL HIGH (ref 70–99)
Glucose-Capillary: 257 mg/dL — ABNORMAL HIGH (ref 70–99)

## 2024-05-01 SURGERY — ANTERIOR CERVICAL DECOMPRESSION/DISCECTOMY FUSION 3 LEVEL/HARDWARE REMOVAL
Anesthesia: General

## 2024-05-01 MED ORDER — ROCURONIUM BROMIDE 10 MG/ML (PF) SYRINGE
PREFILLED_SYRINGE | INTRAVENOUS | Status: AC
  Filled 2024-05-01: qty 10

## 2024-05-01 MED ORDER — 0.9 % SODIUM CHLORIDE (POUR BTL) OPTIME
TOPICAL | Status: DC | PRN
  Administered 2024-05-01: 1000 mL

## 2024-05-01 MED ORDER — INSULIN PUMP
Freq: Three times a day (TID) | SUBCUTANEOUS | Status: DC
  Filled 2024-05-01: qty 1

## 2024-05-01 MED ORDER — TIZANIDINE HCL 4 MG PO TABS: 4.0000 mg | ORAL_TABLET | Freq: Three times a day (TID) | ORAL | 1 refills | Status: AC | PRN

## 2024-05-01 MED ORDER — LACTATED RINGERS IV SOLN: INTRAVENOUS | Status: DC

## 2024-05-01 MED ORDER — CLOBETASOL PROPIONATE 0.05 % EX SOLN: 1.0000 | Freq: Every day | CUTANEOUS | Status: DC

## 2024-05-01 MED ORDER — LATANOPROST 0.005 % OP SOLN
1.0000 [drp] | Freq: Every day | OPHTHALMIC | Status: DC
  Filled 2024-05-01: qty 2.5

## 2024-05-01 MED ORDER — PANTOPRAZOLE SODIUM 40 MG PO TBEC: 40.0000 mg | DELAYED_RELEASE_TABLET | Freq: Every day | ORAL | Status: DC

## 2024-05-01 MED ORDER — SUGAMMADEX SODIUM 200 MG/2ML IV SOLN
INTRAVENOUS | Status: DC | PRN
  Administered 2024-05-01 (×2): 200 mg via INTRAVENOUS

## 2024-05-01 MED ORDER — THROMBIN 5000 UNITS EX KIT
PACK | CUTANEOUS | Status: AC
  Filled 2024-05-01: qty 1

## 2024-05-01 MED ORDER — SODIUM CHLORIDE 0.9% FLUSH: 3.0000 mL | INTRAVENOUS | Status: DC | PRN

## 2024-05-01 MED ORDER — PROPOFOL 10 MG/ML IV BOLUS
INTRAVENOUS | Status: AC
  Filled 2024-05-01: qty 20

## 2024-05-01 MED ORDER — GABAPENTIN 300 MG PO CAPS
300.0000 mg | ORAL_CAPSULE | ORAL | Status: DC
  Filled 2024-05-01: qty 1

## 2024-05-01 MED ORDER — ALBUTEROL SULFATE (2.5 MG/3ML) 0.083% IN NEBU: 3.0000 mL | INHALATION_SOLUTION | RESPIRATORY_TRACT | Status: DC | PRN

## 2024-05-01 MED ORDER — AMISULPRIDE (ANTIEMETIC) 5 MG/2ML IV SOLN: 10.0000 mg | Freq: Once | INTRAVENOUS | Status: DC | PRN

## 2024-05-01 MED ORDER — CHLORHEXIDINE GLUCONATE CLOTH 2 % EX PADS: 6.0000 | MEDICATED_PAD | Freq: Once | CUTANEOUS | Status: DC

## 2024-05-01 MED ORDER — SODIUM CHLORIDE 0.9% FLUSH: 3.0000 mL | Freq: Two times a day (BID) | INTRAVENOUS | Status: DC

## 2024-05-01 MED ORDER — BUPROPION HCL ER (SR) 150 MG PO TB12: 150.0000 mg | ORAL_TABLET | Freq: Two times a day (BID) | ORAL | Status: DC

## 2024-05-01 MED ORDER — SODIUM CHLORIDE 0.9 % IV SOLN
250.0000 mL | INTRAVENOUS | Status: DC
  Administered 2024-05-01: 250 mL via INTRAVENOUS

## 2024-05-01 MED ORDER — PHENOL 1.4 % MT LIQD: 1.0000 | OROMUCOSAL | Status: DC | PRN

## 2024-05-01 MED ORDER — CEFAZOLIN SODIUM-DEXTROSE 2-4 GM/100ML-% IV SOLN
2.0000 g | Freq: Three times a day (TID) | INTRAVENOUS | Status: DC
  Administered 2024-05-01: 2 g via INTRAVENOUS
  Filled 2024-05-01: qty 100

## 2024-05-01 MED ORDER — ATENOLOL 50 MG PO TABS
150.0000 mg | ORAL_TABLET | Freq: Every day | ORAL | Status: DC
Start: 2024-05-01 — End: 2024-05-01
  Filled 2024-05-01: qty 1

## 2024-05-01 MED ORDER — PROPOFOL 10 MG/ML IV BOLUS
INTRAVENOUS | Status: DC | PRN
  Administered 2024-05-01: 170 mg via INTRAVENOUS

## 2024-05-01 MED ORDER — DEXAMETHASONE SODIUM PHOSPHATE 10 MG/ML IJ SOLN
INTRAMUSCULAR | Status: DC | PRN
  Administered 2024-05-01: 5 mg via INTRAVENOUS

## 2024-05-01 MED ORDER — ORAL CARE MOUTH RINSE: 15.0000 mL | Freq: Once | OROMUCOSAL | Status: AC

## 2024-05-01 MED ORDER — ATENOLOL 50 MG PO TABS: 150.0000 mg | ORAL_TABLET | Freq: Every day | ORAL | Status: DC

## 2024-05-01 MED ORDER — ROCURONIUM BROMIDE 10 MG/ML (PF) SYRINGE
PREFILLED_SYRINGE | INTRAVENOUS | Status: DC | PRN
  Administered 2024-05-01 (×2): 10 mg via INTRAVENOUS
  Administered 2024-05-01: 60 mg via INTRAVENOUS

## 2024-05-01 MED ORDER — LIDOCAINE 2% (20 MG/ML) 5 ML SYRINGE
INTRAMUSCULAR | Status: AC
  Filled 2024-05-01: qty 5

## 2024-05-01 MED ORDER — MIDAZOLAM HCL 2 MG/2ML IJ SOLN
INTRAMUSCULAR | Status: AC
  Filled 2024-05-01: qty 2

## 2024-05-01 MED ORDER — AMITRIPTYLINE HCL 50 MG PO TABS: 50.0000 mg | ORAL_TABLET | Freq: Every day | ORAL | Status: DC

## 2024-05-01 MED ORDER — LIDOCAINE 2% (20 MG/ML) 5 ML SYRINGE
INTRAMUSCULAR | Status: DC | PRN
  Administered 2024-05-01: 100 mg via INTRAVENOUS

## 2024-05-01 MED ORDER — MIDAZOLAM HCL 2 MG/2ML IJ SOLN
INTRAMUSCULAR | Status: DC | PRN
  Administered 2024-05-01: 1 mg via INTRAVENOUS

## 2024-05-01 MED ORDER — INSULIN ASPART 100 UNIT/ML IJ SOLN: 0.0000 [IU] | Freq: Three times a day (TID) | INTRAMUSCULAR | Status: DC

## 2024-05-01 MED ORDER — CYCLOBENZAPRINE HCL 10 MG PO TABS
10.0000 mg | ORAL_TABLET | Freq: Three times a day (TID) | ORAL | Status: DC | PRN
  Administered 2024-05-01: 10 mg via ORAL
  Filled 2024-05-01: qty 1

## 2024-05-01 MED ORDER — SENNA 8.6 MG PO TABS
1.0000 | ORAL_TABLET | Freq: Two times a day (BID) | ORAL | Status: DC
  Administered 2024-05-01: 8.6 mg via ORAL
  Filled 2024-05-01: qty 1

## 2024-05-01 MED ORDER — HYDROCODONE-ACETAMINOPHEN 10-325 MG PO TABS
1.0000 | ORAL_TABLET | ORAL | Status: DC | PRN
  Administered 2024-05-01: 1 via ORAL
  Filled 2024-05-01: qty 1

## 2024-05-01 MED ORDER — PHENYLEPHRINE 80 MCG/ML (10ML) SYRINGE FOR IV PUSH (FOR BLOOD PRESSURE SUPPORT)
PREFILLED_SYRINGE | INTRAVENOUS | Status: AC
  Filled 2024-05-01: qty 10

## 2024-05-01 MED ORDER — ONDANSETRON HCL 4 MG/2ML IJ SOLN
INTRAMUSCULAR | Status: DC | PRN
  Administered 2024-05-01: 4 mg via INTRAVENOUS

## 2024-05-01 MED ORDER — ACETAMINOPHEN 500 MG PO TABS
1000.0000 mg | ORAL_TABLET | ORAL | Status: AC
  Administered 2024-05-01: 1000 mg via ORAL
  Filled 2024-05-01: qty 2

## 2024-05-01 MED ORDER — MENTHOL 3 MG MT LOZG: 1.0000 | LOZENGE | OROMUCOSAL | Status: DC | PRN

## 2024-05-01 MED ORDER — PHENYLEPHRINE HCL-NACL 20-0.9 MG/250ML-% IV SOLN
INTRAVENOUS | Status: DC | PRN
  Administered 2024-05-01: 40 ug/min via INTRAVENOUS

## 2024-05-01 MED ORDER — BUSPIRONE HCL 10 MG PO TABS: 10.0000 mg | ORAL_TABLET | Freq: Two times a day (BID) | ORAL | Status: DC

## 2024-05-01 MED ORDER — ONDANSETRON HCL 4 MG/2ML IJ SOLN: 4.0000 mg | Freq: Four times a day (QID) | INTRAMUSCULAR | Status: DC | PRN

## 2024-05-01 MED ORDER — BUPIVACAINE HCL (PF) 0.25 % IJ SOLN
INTRAMUSCULAR | Status: DC | PRN
  Administered 2024-05-01: 8 mL

## 2024-05-01 MED ORDER — ONDANSETRON HCL 4 MG PO TABS: 4.0000 mg | ORAL_TABLET | Freq: Four times a day (QID) | ORAL | Status: DC | PRN

## 2024-05-01 MED ORDER — EZETIMIBE 10 MG PO TABS
10.0000 mg | ORAL_TABLET | Freq: Every day | ORAL | Status: DC
  Administered 2024-05-01: 10 mg via ORAL
  Filled 2024-05-01: qty 1

## 2024-05-01 MED ORDER — BUPIVACAINE HCL (PF) 0.25 % IJ SOLN
INTRAMUSCULAR | Status: AC
  Filled 2024-05-01: qty 30

## 2024-05-01 MED ORDER — CYCLOBENZAPRINE HCL 10 MG PO TABS: 10.0000 mg | ORAL_TABLET | Freq: Two times a day (BID) | ORAL | Status: DC

## 2024-05-01 MED ORDER — MORPHINE SULFATE (PF) 2 MG/ML IV SOLN
2.0000 mg | INTRAVENOUS | Status: DC | PRN
  Administered 2024-05-01: 2 mg via INTRAVENOUS

## 2024-05-01 MED ORDER — PHENYLEPHRINE 80 MCG/ML (10ML) SYRINGE FOR IV PUSH (FOR BLOOD PRESSURE SUPPORT)
PREFILLED_SYRINGE | INTRAVENOUS | Status: DC | PRN
  Administered 2024-05-01: 80 ug via INTRAVENOUS

## 2024-05-01 MED ORDER — CEFAZOLIN SODIUM-DEXTROSE 2-4 GM/100ML-% IV SOLN
2.0000 g | INTRAVENOUS | Status: AC
  Administered 2024-05-01: 2 g via INTRAVENOUS
  Filled 2024-05-01: qty 100

## 2024-05-01 MED ORDER — MORPHINE SULFATE (PF) 2 MG/ML IV SOLN
INTRAVENOUS | Status: AC
  Filled 2024-05-01: qty 1

## 2024-05-01 MED ORDER — FENTANYL CITRATE (PF) 250 MCG/5ML IJ SOLN
INTRAMUSCULAR | Status: AC
  Filled 2024-05-01: qty 5

## 2024-05-01 MED ORDER — ALBUMIN HUMAN 5 % IV SOLN: INTRAVENOUS | Status: DC | PRN

## 2024-05-01 MED ORDER — HYDROCODONE-ACETAMINOPHEN 10-325 MG PO TABS: 1.0000 | ORAL_TABLET | ORAL | 0 refills | Status: AC | PRN

## 2024-05-01 MED ORDER — ONDANSETRON HCL 4 MG/2ML IJ SOLN
INTRAMUSCULAR | Status: AC
  Filled 2024-05-01: qty 2

## 2024-05-01 MED ORDER — FENTANYL CITRATE (PF) 100 MCG/2ML IJ SOLN
INTRAMUSCULAR | Status: AC
  Filled 2024-05-01: qty 2

## 2024-05-01 MED ORDER — ACETAMINOPHEN 325 MG PO TABS: 650.0000 mg | ORAL_TABLET | ORAL | Status: DC | PRN

## 2024-05-01 MED ORDER — ONDANSETRON HCL 4 MG/2ML IJ SOLN: 4.0000 mg | Freq: Once | INTRAMUSCULAR | Status: DC | PRN

## 2024-05-01 MED ORDER — LEVOTHYROXINE SODIUM 100 MCG PO TABS: 200.0000 ug | ORAL_TABLET | Freq: Two times a day (BID) | ORAL | Status: DC

## 2024-05-01 MED ORDER — FENTANYL CITRATE (PF) 100 MCG/2ML IJ SOLN
25.0000 ug | INTRAMUSCULAR | Status: DC | PRN
  Administered 2024-05-01: 50 ug via INTRAVENOUS

## 2024-05-01 MED ORDER — DEXAMETHASONE SODIUM PHOSPHATE 10 MG/ML IJ SOLN
INTRAMUSCULAR | Status: AC
  Filled 2024-05-01: qty 1

## 2024-05-01 MED ORDER — FENTANYL CITRATE (PF) 250 MCG/5ML IJ SOLN
INTRAMUSCULAR | Status: DC | PRN
  Administered 2024-05-01: 150 ug via INTRAVENOUS

## 2024-05-01 MED ORDER — ACETAMINOPHEN 650 MG RE SUPP: 650.0000 mg | RECTAL | Status: DC | PRN

## 2024-05-01 MED ORDER — CHLORHEXIDINE GLUCONATE 0.12 % MT SOLN
15.0000 mL | Freq: Once | OROMUCOSAL | Status: AC
  Administered 2024-05-01: 15 mL via OROMUCOSAL
  Filled 2024-05-01: qty 15

## 2024-05-01 MED ORDER — THROMBIN 5000 UNITS EX SOLR
OROMUCOSAL | Status: DC | PRN
  Administered 2024-05-01: 5 mL via TOPICAL

## 2024-05-01 MED ORDER — GABAPENTIN 300 MG PO CAPS: 300.0000 mg | ORAL_CAPSULE | Freq: Two times a day (BID) | ORAL | Status: DC

## 2024-05-01 SURGICAL SUPPLY — 49 items
BAG COUNTER SPONGE SURGICOUNT (BAG) ×2 IMPLANT
BAND RUBBER #18 3X1/16 STRL (MISCELLANEOUS) ×4 IMPLANT
BASKET BONE COLLECTION (BASKET) ×2 IMPLANT
BENZOIN TINCTURE PRP APPL 2/3 (GAUZE/BANDAGES/DRESSINGS) ×2 IMPLANT
BIT DRILL 2.3X12 (BIT) IMPLANT
BUR CARBIDE MATCH 3.0 (BURR) ×2 IMPLANT
CANISTER SUCT 3000ML PPV (MISCELLANEOUS) ×2 IMPLANT
DRAPE C-ARM 42X72 X-RAY (DRAPES) ×4 IMPLANT
DRAPE LAPAROTOMY 100X72 PEDS (DRAPES) ×2 IMPLANT
DRAPE MICROSCOPE SLANT 54X150 (MISCELLANEOUS) IMPLANT
DRSG OPSITE POSTOP 4X8 (GAUZE/BANDAGES/DRESSINGS) IMPLANT
DURAPREP 6ML APPLICATOR 50/CS (WOUND CARE) ×2 IMPLANT
ELECT COATED BLADE 2.86 ST (ELECTRODE) ×2 IMPLANT
ELECTRODE REM PT RTRN 9FT ADLT (ELECTROSURGICAL) ×2 IMPLANT
GAUZE 4X4 16PLY ~~LOC~~+RFID DBL (SPONGE) IMPLANT
GLOVE BIO SURGEON STRL SZ7 (GLOVE) ×2 IMPLANT
GLOVE BIO SURGEON STRL SZ8 (GLOVE) ×2 IMPLANT
GLOVE BIOGEL PI IND STRL 7.0 (GLOVE) ×2 IMPLANT
GOWN STRL REUS W/ TWL LRG LVL3 (GOWN DISPOSABLE) ×2 IMPLANT
GOWN STRL REUS W/ TWL XL LVL3 (GOWN DISPOSABLE) ×2 IMPLANT
GOWN STRL REUS W/TWL 2XL LVL3 (GOWN DISPOSABLE) IMPLANT
HEMOSTAT POWDER KIT SURGIFOAM (HEMOSTASIS) ×2 IMPLANT
KIT BASIN OR (CUSTOM PROCEDURE TRAY) ×2 IMPLANT
KIT TURNOVER KIT B (KITS) ×2 IMPLANT
NDL HYPO 25X1 1.5 SAFETY (NEEDLE) ×2 IMPLANT
NDL SPNL 20GX3.5 QUINCKE YW (NEEDLE) ×2 IMPLANT
NEEDLE HYPO 25X1 1.5 SAFETY (NEEDLE) ×1 IMPLANT
NEEDLE SPNL 20GX3.5 QUINCKE YW (NEEDLE) ×1 IMPLANT
NS IRRIG 1000ML POUR BTL (IV SOLUTION) ×2 IMPLANT
PACK LAMINECTOMY NEURO (CUSTOM PROCEDURE TRAY) ×2 IMPLANT
PAD ARMBOARD POSITIONER FOAM (MISCELLANEOUS) ×2 IMPLANT
PATTIES SURGICAL .5 X.5 (GAUZE/BANDAGES/DRESSINGS) ×2 IMPLANT
PATTIES SURGICAL 1X1 (DISPOSABLE) ×2 IMPLANT
PIN DISTRACTION 14MM (PIN) ×4 IMPLANT
PLATE ANT CERV INSG 22 1L (Plate) IMPLANT
PUTTY BONE 2.5CC (Putty) IMPLANT
RASP 3.0MM (RASP) IMPLANT
SCREW VA SINGLE LEAD 4X14 ST (Screw) IMPLANT
SCREW VA SINGLE LEAD 4X16 (Screw) IMPLANT
SPACER ASSEM CERV LORD 8M (Spacer) IMPLANT
SPONGE INTESTINAL PEANUT (DISPOSABLE) ×2 IMPLANT
SPONGE SURGIFOAM ABS GEL 100 (HEMOSTASIS) IMPLANT
STRIP CLOSURE SKIN 1/2X4 (GAUZE/BANDAGES/DRESSINGS) ×2 IMPLANT
SUT VIC AB 3-0 SH 8-18 (SUTURE) ×2 IMPLANT
SUT VICRYL 4-0 PS2 18IN ABS (SUTURE) IMPLANT
SYR 30ML SLIP (SYRINGE) ×2 IMPLANT
TOWEL GREEN STERILE (TOWEL DISPOSABLE) ×2 IMPLANT
TOWEL GREEN STERILE FF (TOWEL DISPOSABLE) ×2 IMPLANT
WATER STERILE IRR 1000ML POUR (IV SOLUTION) ×2 IMPLANT

## 2024-05-01 NOTE — Plan of Care (Signed)
  Problem: Education: Goal: Knowledge of General Education information will improve Description: Including pain rating scale, medication(s)/side effects and non-pharmacologic comfort measures Outcome: Completed/Met   Problem: Health Behavior/Discharge Planning: Goal: Ability to manage health-related needs will improve Outcome: Completed/Met   Problem: Clinical Measurements: Goal: Ability to maintain clinical measurements within normal limits will improve Outcome: Completed/Met Goal: Will remain free from infection Outcome: Completed/Met Goal: Diagnostic test results will improve Outcome: Completed/Met Goal: Respiratory complications will improve Outcome: Completed/Met Goal: Cardiovascular complication will be avoided Outcome: Completed/Met   Problem: Activity: Goal: Risk for activity intolerance will decrease Outcome: Completed/Met   Problem: Nutrition: Goal: Adequate nutrition will be maintained Outcome: Completed/Met   Problem: Coping: Goal: Level of anxiety will decrease Outcome: Completed/Met   Problem: Elimination: Goal: Will not experience complications related to bowel motility Outcome: Completed/Met Goal: Will not experience complications related to urinary retention Outcome: Completed/Met   Problem: Pain Managment: Goal: General experience of comfort will improve and/or be controlled Outcome: Completed/Met   Problem: Safety: Goal: Ability to remain free from injury will improve Outcome: Completed/Met   Problem: Skin Integrity: Goal: Risk for impaired skin integrity will decrease Outcome: Completed/Met   Problem: Education: Goal: Ability to describe self-care measures that may prevent or decrease complications (Diabetes Survival Skills Education) will improve Outcome: Completed/Met Goal: Individualized Educational Video(s) Outcome: Completed/Met   Problem: Coping: Goal: Ability to adjust to condition or change in health will improve Outcome:  Completed/Met   Problem: Fluid Volume: Goal: Ability to maintain a balanced intake and output will improve Outcome: Completed/Met   Problem: Health Behavior/Discharge Planning: Goal: Ability to identify and utilize available resources and services will improve Outcome: Completed/Met Goal: Ability to manage health-related needs will improve Outcome: Completed/Met   Problem: Metabolic: Goal: Ability to maintain appropriate glucose levels will improve Outcome: Completed/Met   Problem: Nutritional: Goal: Maintenance of adequate nutrition will improve Outcome: Completed/Met Goal: Progress toward achieving an optimal weight will improve Outcome: Completed/Met   Problem: Skin Integrity: Goal: Risk for impaired skin integrity will decrease Outcome: Completed/Met   Problem: Tissue Perfusion: Goal: Adequacy of tissue perfusion will improve Outcome: Completed/Met   Problem: Education: Goal: Ability to verbalize activity precautions or restrictions will improve Outcome: Completed/Met Goal: Knowledge of the prescribed therapeutic regimen will improve Outcome: Completed/Met Goal: Understanding of discharge needs will improve Outcome: Completed/Met   Problem: Activity: Goal: Ability to avoid complications of mobility impairment will improve Outcome: Completed/Met Goal: Ability to tolerate increased activity will improve Outcome: Completed/Met Goal: Will remain free from falls Outcome: Completed/Met   Problem: Bowel/Gastric: Goal: Gastrointestinal status for postoperative course will improve Outcome: Completed/Met   Problem: Clinical Measurements: Goal: Ability to maintain clinical measurements within normal limits will improve Outcome: Completed/Met Goal: Postoperative complications will be avoided or minimized Outcome: Completed/Met Goal: Diagnostic test results will improve Outcome: Completed/Met   Problem: Pain Management: Goal: Pain level will decrease Outcome:  Completed/Met   Problem: Skin Integrity: Goal: Will show signs of wound healing Outcome: Completed/Met   Problem: Health Behavior/Discharge Planning: Goal: Identification of resources available to assist in meeting health care needs will improve Outcome: Completed/Met   Problem: Bladder/Genitourinary: Goal: Urinary functional status for postoperative course will improve Outcome: Completed/Met

## 2024-05-01 NOTE — Anesthesia Procedure Notes (Addendum)
 Procedure Name: Intubation Date/Time: 05/01/2024 8:42 AM  Performed by: Leandro Proffer, CRNAPre-anesthesia Checklist: Patient identified, Emergency Drugs available, Suction available and Patient being monitored Patient Re-evaluated:Patient Re-evaluated prior to induction Oxygen Delivery Method: Circle system utilized Preoxygenation: Pre-oxygenation with 100% oxygen Induction Type: IV induction Ventilation: Mask ventilation without difficulty Laryngoscope Size: Glidescope and 4 Grade View: Grade II Tube type: Oral Tube size: 7.5 mm Number of attempts: 1 Airway Equipment and Method: Stylet and Oral airway Placement Confirmation: ETT inserted through vocal cords under direct vision, positive ETCO2 and breath sounds checked- equal and bilateral Secured at: 23 cm Tube secured with: Tape Dental Injury: Teeth and Oropharynx as per pre-operative assessment

## 2024-05-01 NOTE — Inpatient Diabetes Management (Signed)
 Inpatient Diabetes Program Recommendations  AACE/ADA: New Consensus Statement on Inpatient Glycemic Control (2015)  Target Ranges:  Prepandial:   less than 140 mg/dL      Peak postprandial:   less than 180 mg/dL (1-2 hours)      Critically ill patients:  140 - 180 mg/dL   Lab Results  Component Value Date   GLUCAP 243 (H) 05/01/2024   HGBA1C 7.5 (H) 04/16/2024    Review of Glycemic Control  Latest Reference Range & Units 05/01/24 06:41 05/01/24 11:25  Glucose-Capillary 70 - 99 mg/dL 981 (H) 191 (H)  (H): Data is abnormally high  Diabetes history: T1DM Outpatient Diabetes medications:  Medtronic 780 with Dexcom G7 Fiasp Basal-2.95/hr (70.8) ICR-10 Target-90-140 Current orders for Inpatient glycemic control: Insulin Pump Therapy  Met with patient and spouse at bedside.  He was diagnosed with T1DM at age 63.  He is current with his endocrinologist, Dr. Jesse Moritz.  He uses the Medtronic 780 insulin pump with the Dexcom G7 CGM.  Reviewed pump settings.  Patient has received Decadron 5 mg today for surgery.  Explained he will likely see his glucose trends elevated.  May need to correct more often.  He states he has been having to use finger sticks often because his Dexcom G7 is very difficult to replace with the customer service line.  He uses a reader (not a smart phone) so cannot access the app to request replacements.  Explained they can create a Dexcom account online and go to customer support.  Once logged in, he can request a replacement sensor as needed.  Printed directions and provided them with a copy on how to request a replacement CGM.    Will continue to follow while inpatient.  Thank you, Willie Lipschutz, MSN, CDCES Diabetes Coordinator Inpatient Diabetes Program [phone] (team pager from 8a-5p)

## 2024-05-01 NOTE — Progress Notes (Signed)
 Discharged instructions/education/AVS/Rx given to patient with wife at bedside and they both verbalized understanding. Patient ambulating with supervision on walker. Patient voiding and emptying bladder well. No swelling, no drainage, no redness noted on incision site. Pain mild to moderate and controlled by PRN pain meds. No swallowing complaints and tolerated dinner well. Discharged via wheelchair.

## 2024-05-01 NOTE — Anesthesia Postprocedure Evaluation (Signed)
 Anesthesia Post Note  Patient: Willie [name]  Procedure(s) Performed: ANTERIOR CERVICAL DECOMPRESSION/DISCECTOMY FUSION  Cervical Five-Cervical Six, remove tether plate Cervical Six-Seven     Patient location during evaluation: PACU Anesthesia Type: General Level of consciousness: awake and alert Pain management: pain level controlled Vital Signs Assessment: post-procedure vital signs reviewed and stable Respiratory status: spontaneous breathing, nonlabored ventilation, respiratory function stable and patient connected to nasal cannula oxygen Cardiovascular status: blood pressure returned to baseline and stable Postop Assessment: no apparent nausea or vomiting Anesthetic complications: no   No notable events documented.  Last Vitals:  Vitals:   05/01/24 1305 05/01/24 1617  BP: 116/78 135/84  Pulse: 93 100  Resp: 18 18  Temp: 36.8 C   SpO2:  94%    Last Pain:  Vitals:   05/01/24 1529  TempSrc:   PainSc: 6                  Erin Havers

## 2024-05-01 NOTE — Progress Notes (Signed)
 Dr. Denese Finn made aware that the patient has an insulin pump and decreased his rate by 50 percent at midnight. No new orders received.

## 2024-05-01 NOTE — H&P (Signed)
 Subjective:   Patient is a 63 y.o. male admitted for neck pin. The patient first presented to me with complaints of neck pain and numbness of the arm(s). Onset of symptoms was several months ago. The pain is described as aching and stabbing and occurs all day. The pain is rated severe, and is located in the neck and radiates to the shoulders. The symptoms have been progressive. Symptoms are exacerbated by extending head backwards, and are relieved by none.  Previous work up includes MRI of cervical spine, results: spinal stenosis.  Past Medical History:  Diagnosis Date   Anxiety    Arthritis    BPH (benign prostatic hyperplasia)    Depression    Diabetes (HCC)    Diabetic neuropathy (HCC)    Diabetic retinopathy (HCC)    Esophageal stricture    Fibromyalgia    History of kidney stones    Hyperlipidemia    Hypertension    Hypothyroidism    Melanoma (HCC)    OSA (obstructive sleep apnea)    Polyneuropathy    Tremor     Past Surgical History:  Procedure Laterality Date   CARPAL TUNNEL RELEASE Bilateral 2002   CATARACT EXTRACTION     CERVICAL DISC SURGERY  2005   CYST EXCISION  2002   middle finger   CYST EXCISION  2007   left thigh   ESOPHAGEAL DILATION     ROTATOR CUFF REPAIR Left    TRIGGER FINGER RELEASE Bilateral     Allergies  Allergen Reactions   Other Other (See Comments), Shortness Of Breath and Swelling    Flu vaccine   Latex Rash    Adhesive     Social History   Tobacco Use   Smoking status: Never   Smokeless tobacco: Former    Quit date: 11/14/2003   Tobacco comments:    Pt quit dip in 2004  Substance Use Topics   Alcohol use: Not Currently    Comment: rare (less than one every couple months)    Family History  Problem Relation Age of Onset   Tremor Mother    Thyroid disease Mother    CAD Father    COPD Father    Other Brother        unknown   Healthy Son    Prior to Admission medications   Medication Sig Start Date End Date Taking?  Authorizing Provider  amitriptyline (ELAVIL) 50 MG tablet Take 50 mg by mouth daily. 01/02/17  Yes [provider]  Ascorbic Acid (VITAMIN C) 1000 MG tablet Take 1,000 mg by mouth daily.   Yes [provider]  atenolol (TENORMIN) 50 MG tablet Take 150 mg by mouth at bedtime. 12/13/16  Yes [provider]  buPROPion (WELLBUTRIN SR) 150 MG 12 hr tablet Take 150 mg by mouth 2 (two) times daily.  12/16/16  Yes [provider]  busPIRone (BUSPAR) 10 MG tablet Take 10 mg by mouth 2 (two) times daily. 11/14/16  Yes [provider]  clobetasol (TEMOVATE) 0.05 % external solution Apply 1 Application topically daily. 02/28/24  Yes [provider]  cyclobenzaprine (FLEXERIL) 10 MG tablet Take 10 mg by mouth 2 (two) times daily.   Yes [provider]  ezetimibe (ZETIA) 10 MG tablet Take 10 mg by mouth daily. 02/26/24  Yes [provider]  gabapentin (NEURONTIN) 300 MG capsule Take 300 mg by mouth 2 (two) times daily.   Yes [provider]  Insulin NPH, Human,, Isophane, (NOVOLIN N FLEXPEN)  100 UNIT/ML Kiwkpen Inject into the skin as needed.   Yes [provider]  insulin regular (NOVOLIN R) 100 units/mL injection Inject into the skin 3 (three) times daily as needed for high blood sugar.   Yes [provider]  latanoprost (XALATAN) 0.005 % ophthalmic solution Place 1 drop into both eyes at bedtime.   Yes [provider]  levothyroxine (SYNTHROID) 112 MCG tablet Take 200 mcg by mouth 2 (two) times daily. 12/23/16  Yes [provider]  NOVOLOG 100 UNIT/ML injection continuous. Insulin pump   Yes [provider]  omeprazole  (PRILOSEC) 40 MG capsule TAKE ONE CAPSULE BY MOUTH EVERY MORNING AND EVERY EVENING 10/04/21  Yes Byrum, Delora Ferry, MD  rosuvastatin (CRESTOR) 10 MG tablet Take 10 mg by mouth 3 (three) times a week. 10/12/16  Yes [provider]  tiZANidine (ZANAFLEX) 4 MG tablet Take 4  mg by mouth every 8 (eight) hours as needed for muscle spasms. 03/26/24  Yes [provider]  traMADol (ULTRAM) 50 MG tablet Take 50 mg by mouth 3 (three) times daily. 10/17/16  Yes [provider]  vitamin B-12 (CYANOCOBALAMIN) 1000 MCG tablet Take 1,000 mcg by mouth daily.   Yes [provider]  albuterol  (VENTOLIN  HFA) 108 (90 Base) MCG/ACT inhaler Inhale 2 puffs into the lungs every 4 (four) hours as needed for wheezing or shortness of breath. 08/21/20   Denson Flake, MD     Review of Systems  Positive ROS: neg  All other systems have been reviewed and were otherwise negative with the exception of those mentioned in the HPI and as above.  Objective: Vital signs in last 24 hours: Temp:  [97.7 F (36.5 C)] 97.7 F (36.5 C) (05/07 0644) Pulse Rate:  [78] 78 (05/07 0644) Resp:  [18] 18 (05/07 0644) BP: (126)/(79) 126/79 (05/07 0644) SpO2:  [98 %] 98 % (05/07 0644) Weight:  [97.3 kg] 97.3 kg (05/07 0644)  General Appearance: Alert, cooperative, no distress, appears stated age Head: Normocephalic, without obvious abnormality, atraumatic Eyes: PERRL, conjunctiva/corneas clear, EOM's intact      Neck: Supple, symmetrical, trachea midline, Back: Symmetric, no curvature, ROM normal, no CVA tenderness Lungs:  respirations unlabored Heart: Regular rate and rhythm Abdomen: Soft, non-tender Extremities: Extremities normal, atraumatic, no cyanosis or edema Pulses: 2+ and symmetric all extremities Skin: Skin color, texture, turgor normal, no rashes or lesions  NEUROLOGIC:  Mental status: Alert and oriented x4, no aphasia, good attention span, fund of knowledge and memory  Motor Exam - grossly normal Sensory Exam - grossly normal Reflexes: 1+ Coordination - grossly normal Gait - grossly normal Balance - grossly normal Cranial Nerves: I: smell Not tested  II: visual acuity  OS: nl    OD: nl  II: visual fields Full to confrontation  II: pupils Equal, round,  reactive to light  III,VII: ptosis None  III,IV,VI: extraocular muscles  Full ROM  V: mastication Normal  V: facial light touch sensation  Normal  V,VII: corneal reflex  Present  VII: facial muscle function - upper  Normal  VII: facial muscle function - lower Normal  VIII: hearing Not tested  IX: soft palate elevation  Normal  IX,X: gag reflex Present  XI: trapezius strength  5/5  XI: sternocleidomastoid strength 5/5  XI: neck flexion strength  5/5  XII: tongue strength  Normal    Data Review Lab Results  Component Value Date   WBC 7.3 04/16/2024   HGB 16.3 04/16/2024   HCT 48.5  04/16/2024   MCV 88.2 04/16/2024   PLT 250 04/16/2024   Lab Results  Component Value Date   NA 137 04/16/2024   K 3.6 04/16/2024   CL 98 04/16/2024   CO2 31 04/16/2024   BUN 7 (L) 04/16/2024   CREATININE 0.89 04/16/2024   GLUCOSE 70 04/16/2024   Lab Results  Component Value Date   INR 1.0 04/16/2024    Assessment:   Cervical neck pain with herniated nucleus pulposus/ spondylosis/ stenosis at C3-4 C4-5 C5-6. Estimated body mass index is 31.68 kg/m as calculated from the following:   Height as of this encounter: 5\' 9"  (1.753 m).   Weight as of this encounter: 97.3 kg.  Patient has failed conservative therapy. Planned surgery : CDF with plate Z6-1 W9-6 C5-6  Plan:   I explained the condition and procedure to the patient and answered any questions.  Patient wishes to proceed with procedure as planned. Understands risks/ benefits/ and expected or typical outcomes.  Isadora Mar 05/01/2024 7:36 AM

## 2024-05-01 NOTE — Transfer of Care (Signed)
 Immediate Anesthesia Transfer of Care Note  Patient: Willie [name]  Procedure(s) Performed: ANTERIOR CERVICAL DECOMPRESSION/DISCECTOMY FUSION  Cervical Five-Cervical Six, remove tether plate Cervical Six-Seven  Patient Location: PACU  Anesthesia Type:General  Level of Consciousness: awake  Airway & Oxygen Therapy: Patient Spontanous Breathing and Patient connected to face mask oxygen  Post-op Assessment: Report given to RN and Post -op Vital signs reviewed and stable  Post vital signs: Reviewed and stable  Last Vitals:  Vitals Value Taken Time  BP 121/62 05/01/24 1122  Temp    Pulse 84 05/01/24 1125  Resp 16 05/01/24 1125  SpO2 95 % 05/01/24 1125  Vitals shown include unfiled device data.  Last Pain:  Vitals:   05/01/24 0718  PainSc: 10-Worst pain ever      Patients Stated Pain Goal: 3 (05/01/24 1610)  Complications: No notable events documented.

## 2024-05-01 NOTE — Op Note (Signed)
 05/01/2024  11:13 AM  PATIENT:  Willie [name]  63 y.o. 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 Decompressive anterior cervical discectomy C5-6, 2. Anterior cervical arthrodesis C5-6 utilizing a cortical cancellous structural allograft, 3. Anterior cervical plating C5-6 utilizing a ATEC plate, 4.  Removal of anterior cervical plate Z6-1  SURGEON:  Waymond Hailey, MD  ASSISTANTS: Jackee Marus, FNP  ANESTHESIA:   General  EBL: 50 ml  Total I/O In: 950 [I.V.:700; IV Piggyback:250] Out: -   BLOOD ADMINISTERED: none  DRAINS: none  SPECIMEN:  none  INDICATION FOR PROCEDURE: This patient presented with axial neck pain with radiation into her shoulders and interscapular region. Imaging showed spondylosis with cervical spinal stenosis C5-6 and cervical spondylosis at C3-4 and C4-5.Aaron Aas The patient tried conservative measures without relief. Pain was debilitating. Recommended ACDF with plating C3-4 C4-5 and C5-6. Patient understood the risks, benefits, and alternatives and potential outcomes and wished to proceed.  Fortunately at the time of surgery we found him to have large anterior flowing osteophytes over C3-4 and C4-5.  The functionally fused the levels.  Therefore we did not feel that taking down there is osteophytes and refusing the 2 levels was necessary.  Therefore we decided to only do C5-6.  Was the level with moderate spinal stenosis and significant foraminal stenosis.  PROCEDURE DETAILS: Patient was brought to the operating room placed under general endotracheal anesthesia. Patient was placed in the supine position on the operating room table. The neck was prepped with Duraprep and draped in a sterile fashion.   Three cc of local anesthesia was injected and a transverse incision was made on the right side of the neck.  Dissection  was carried down thru the subcutaneous tissue and the platysma was  elevated, opened, and undermined with Metzenbaum scissors.  Dissection was then carried out thru an avascular plane leaving the sternocleidomastoid carotid artery and jugular vein laterally and the trachea and esophagus medially with the assistance of my nurse practitioner. The ventral aspect of the vertebral column was identified and a localizing x-ray was taken. The C5-6 level was identified and all in the room agreed with the level.  We also identified the anterior cervical plate at W9-6.  The longus colli muscles were then elevated and the retractor was placed with the assistance of my nurse practitioner.  There were large anterior flowing osteophytes across C3-4 C4-5 and C5-6.  These appeared to functionally fuse C3-4 and C4-5 but not necessarily C5-6.  C5-6 had moderate stenosis on the preoperative imaging and there was only mild stenosis at C3-4 and C4-5, and we felt that taking down the osteophytes and then refusing those levels was unnecessary.  Therefore we decided to abort those levels and only do C5-6.  We were able to remove the anterior cervical plate.  We remove the upper 2 screws but the screws in C7 were not removable.  Therefore we cut the plate at that point and remove the top half of the plate.  The annulus C5-6 was incised and the disc space entered. Discectomy was performed with micro-curettes and pituitary rongeurs. I then used the high-speed drill to drill the endplates down to the level of the posterior longitudinal ligament. The drill shavings were saved in a mucous trap for later arthrodesis. The operating microscope was draped and brought into the field provided additional magnification, illumination and  visualization. Discectomy was continued posteriorly thru the disc space. Posterior longitudinal ligament was opened with a nerve hook, and then removed along with disc herniation and osteophytes, decompressing the spinal  canal and thecal sac. We then continued to remove osteophytic overgrowth and disc material decompressing the neural foramina and exiting nerve roots bilaterally. The scope was angled up and down to help decompress and undercut the vertebral bodies. Once the decompression was completed we could pass a nerve hook circumferentially to assure adequate decompression in the midline and in the neural foramina. So by both visualization and palpation we felt we had an adequate decompression of the neural elements. We then measured the height of the intravertebral disc space be 8 mm and chose a corresponding cortical cancellous allograft.. It was then gently positioned in the intravertebral disc space(s) and countersunk. I then used a 22 mm ATEC plate and placed variable angle screws into the vertebral bodies of each level C6 and C7 and locked them into position. The wound was irrigated with bacitracin solution, checked for hemostasis which was established and confirmed. Once meticulous hemostasis was achieved, we then proceeded with closure with the assistance of my nurse practitioner. The platysma was closed with interrupted 3-0 undyed Vicryl suture, the subcuticular layer was closed with interrupted 3-0 undyed Vicryl suture. The skin edges were approximated with steristrips. The drapes were removed. A sterile dressing was applied. The patient was then awakened from general anesthesia and transferred to the recovery room in stable condition. At the end of the procedure all sponge, needle and instrument counts were correct.   PLAN OF CARE: Admit to inpatient   PATIENT DISPOSITION:  PACU - hemodynamically stable.   Delay start of Pharmacological VTE agent (>24hrs) due to surgical blood loss or risk of bleeding:  yes

## 2024-05-01 NOTE — Discharge Summary (Signed)
 Physician Discharge Summary  Patient ID: Willie [name] MRN: [id number] DOB/AGE: 1961-01-24 63 y.o.  Admit date: 05/01/2024 Discharge date: 05/01/2024  Admission Diagnoses: cervical stenosis    Discharge Diagnoses: same   Discharged Condition: good  Hospital Course: The patient was admitted on 05/01/2024 and taken to the operating room where the patient underwent ACDF C5-6. The patient tolerated the procedure well and was taken to the recovery room and then to the floor in stable condition. The hospital course was routine. There were no complications. The wound remained clean dry and intact. Pt had appropriate neck soreness. No complaints of arm pain or new N/T/W. The patient remained afebrile with stable vital signs, and tolerated a regular diet. The patient continued to increase activities, and pain was well controlled with oral pain medications.   Consults: None  Significant Diagnostic Studies:  Results for orders placed or performed during the hospital encounter of 05/01/24  Glucose, capillary   Collection Time: 05/01/24  6:41 AM  Result Value Ref Range   Glucose-Capillary 190 (H) 70 - 99 mg/dL  Type and screen [facility]   Collection Time: 05/01/24  7:14 AM  Result Value Ref Range   ABO/RH(D) A NEG    Antibody Screen NEG    Sample Expiration      05/04/2024,2359 Performed at [facility], [address], [place], [place] [address]   Glucose, capillary   Collection Time: 05/01/24 11:25 AM  Result Value Ref Range   Glucose-Capillary 243 (H) 70 - 99 mg/dL  Glucose, capillary   Collection Time: 05/01/24  4:47 PM  Result Value Ref Range   Glucose-Capillary 257 (H) 70 - 99 mg/dL   Comment 1 Notify RN    Comment 2 Document in Chart     DG Cervical Spine 2 or 3 views Result Date: 05/01/2024 CLINICAL DATA:  Elective surgery. EXAM: CERVICAL SPINE - 2-3 VIEW COMPARISON:  None Available. FINDINGS: Four fluoroscopic spot views of the cervical spine submitted  from the operating room. Imaging obtained during anterior fusion C5-C6. Fluoroscopy time 30 seconds. Dose 17.86 mGy. IMPRESSION: Intraoperative fluoroscopy during anterior fusion C5-C6. Electronically Signed   By: Chadwick Colonel M.D.   On: 05/01/2024 12:10   DG C-Arm 1-60 Min-No Report Result Date: 05/01/2024 Fluoroscopy was utilized by the requesting physician.  No radiographic interpretation.   DG C-Arm 1-60 Min-No Report Result Date: 05/01/2024 Fluoroscopy was utilized by the requesting physician.  No radiographic interpretation.    Antibiotics:  Anti-infectives (From admission, onward)    Start     Dose/Rate Route Frequency Ordered Stop   05/01/24 1300  ceFAZolin (ANCEF) IVPB 2g/100 mL premix        2 g 200 mL/hr over 30 Minutes Intravenous Every 8 hours 05/01/24 1251 05/02/24 0459   05/01/24 0645  ceFAZolin (ANCEF) IVPB 2g/100 mL premix        2 g 200 mL/hr over 30 Minutes Intravenous On call to O.R. 05/01/24 6606 05/01/24 0849       Discharge Exam: Blood pressure 135/84, pulse 100, temperature 98.3 F (36.8 C), temperature source Oral, resp. rate 18, height 5\' 9"  (1.753 m), weight 97.3 kg, SpO2 94%. Neurologic: Grossly normal Dressing dry  Discharge Medications:   Allergies as of 05/01/2024       Reactions   Other Other (See Comments), Shortness Of Breath, Swelling   Flu vaccine   Latex Rash   Adhesive         Medication List  STOP taking these medications    cyclobenzaprine 10 MG tablet Commonly known as: FLEXERIL       TAKE these medications    albuterol  108 (90 Base) MCG/ACT inhaler Commonly known as: VENTOLIN  HFA Inhale 2 puffs into the lungs every 4 (four) hours as needed for wheezing or shortness of breath.   amitriptyline 50 MG tablet Commonly known as: ELAVIL Take 50 mg by mouth daily.   atenolol 50 MG tablet Commonly known as: TENORMIN Take 150 mg by mouth at bedtime.   buPROPion 150 MG 12 hr tablet Commonly known as: WELLBUTRIN  SR Take 150 mg by mouth 2 (two) times daily.   busPIRone 10 MG tablet Commonly known as: BUSPAR Take 10 mg by mouth 2 (two) times daily.   clobetasol 0.05 % external solution Commonly known as: TEMOVATE Apply 1 Application topically daily.   cyanocobalamin 1000 MCG tablet Commonly known as: VITAMIN B12 Take 1,000 mcg by mouth daily.   ezetimibe 10 MG tablet Commonly known as: ZETIA Take 10 mg by mouth daily.   gabapentin 300 MG capsule Commonly known as: NEURONTIN Take 300 mg by mouth 2 (two) times daily.   HYDROcodone-acetaminophen 10-325 MG tablet Commonly known as: NORCO Take 1 tablet by mouth every 4 (four) hours as needed for moderate pain (pain score 4-6).   insulin regular 100 units/mL injection Commonly known as: NOVOLIN R Inject into the skin 3 (three) times daily as needed for high blood sugar.   latanoprost 0.005 % ophthalmic solution Commonly known as: XALATAN Place 1 drop into both eyes at bedtime.   levothyroxine 112 MCG tablet Commonly known as: SYNTHROID Take 200 mcg by mouth 2 (two) times daily.   NovoLIN N FlexPen 100 UNIT/ML FlexPen Generic drug: Insulin NPH (Human) (Isophane) Inject into the skin as needed.   NovoLOG 100 UNIT/ML injection Generic drug: insulin aspart continuous. Insulin pump   omeprazole  40 MG capsule Commonly known as: PRILOSEC TAKE ONE CAPSULE BY MOUTH EVERY MORNING AND EVERY EVENING   rosuvastatin 10 MG tablet Commonly known as: CRESTOR Take 10 mg by mouth 3 (three) times a week.   tiZANidine 4 MG tablet Commonly known as: ZANAFLEX Take 1 tablet (4 mg total) by mouth every 8 (eight) hours as needed for muscle spasms.   traMADol 50 MG tablet Commonly known as: ULTRAM Take 50 mg by mouth 3 (three) times daily.   vitamin C 1000 MG tablet Take 1,000 mg by mouth daily.        Disposition: home   Final Dx: ACDF C5-6  Discharge Instructions      Remove dressing in 72 hours   Complete by: As directed    Call  MD for:  difficulty breathing, headache or visual disturbances   Complete by: As directed    Call MD for:  persistant nausea and vomiting   Complete by: As directed    Call MD for:  redness, tenderness, or signs of infection (pain, swelling, redness, odor or green/yellow discharge around incision site)   Complete by: As directed    Call MD for:  severe uncontrolled pain   Complete by: As directed    Call MD for:  temperature >100.4   Complete by: As directed    Diet - low sodium heart healthy   Complete by: As directed    Increase activity slowly   Complete by: As directed           Signed: Isadora Mar 05/01/2024, 5:22 PM

## 2024-05-01 NOTE — Evaluation (Signed)
 Occupational Therapy Evaluation Patient Details Name: Willie [name] MRN: [id number] DOB: 1961-10-09 Today's Date: 05/01/2024   History of Present Illness   Pt is a 63 yo male admitted to [facility] on 05/01/24 for cervical stenosis and s/p elective ACDF C5-C6 on 05/01/24. PMH of arhritis, BPH, DM, neuropathy, retinopathy, HLD, HTN, OSA.     Clinical Impressions Pt admitted for above, PTA pt had assist from his spouse to transfer out of his recliner and she would assist with bathing/dressing. Pt currently with impaired recall of C spine precautions and is impulsive, per spouse he has baseline cog deficits. Pt needing mod A to complete LB ADLs and MAX cues throughout session to maintain C spine precautions, reinforced these precautions with pt spouse and educated them both on compensatory strategies to complete ADLs and transfers. He possesses strongly impaired safety awareness. Pt has a hx of falls, educated the spouse on gait belt use but pt ambulating with CGA + RW today which is an improvement from his reported baseline prior to sx. OT will continue following pt acutely to address deficits and reinforce precautions as needed, procured a soft collar brace for pt to promote sensory recall of precautions. Recommend HHOT and HHPT services for pt as he would benefit from balance training, Outpatient appropriate if unable to do [facility].        If plan is discharge home, recommend the following:   Direct supervision/assist for financial management;Supervision due to cognitive status;Direct supervision/assist for medications management;A lot of help with bathing/dressing/bathroom;Assistance with cooking/housework;Assist for transportation     Functional Status Assessment   Patient has had a recent decline in their functional status and demonstrates the ability to make significant improvements in function in a reasonable and predictable amount of time.     Equipment Recommendations   Other (comment) (RW)      Recommendations for Other Services   PT consult     Precautions/Restrictions   Precautions Precautions: Fall;Cervical Precaution Booklet Issued: Yes (comment) Recall of Precautions/Restrictions: Impaired Precaution/Restrictions Comments: Reviewed C spine precautions with pt and spouse Required Braces or Orthoses: Other Brace Other Brace: no brace needed- soft collar brace ordered and donned as pt needs reminders for precautions. Restrictions Weight Bearing Restrictions Per Provider Order: No     Mobility Bed Mobility Overal bed mobility: Needs Assistance Bed Mobility: Rolling, Sidelying to Sit, Sit to Sidelying Rolling: Supervision Sidelying to sit: Supervision     Sit to sidelying: Supervision General bed mobility comments: Educated pt on log roll for bed mobility, min cues for new learning but good adherence    Transfers Overall transfer level: Needs assistance Equipment used: Rolling walker (2 wheels) Transfers: Sit to/from Stand Sit to Stand: Contact guard assist           General transfer comment: cues for hand placement. CGA for safety      Balance Overall balance assessment: Needs assistance Sitting-balance support: No upper extremity supported, Feet supported Sitting balance-Leahy Scale: Good     Standing balance support: During functional activity Standing balance-Leahy Scale: Fair Standing balance comment: ambulates short distance no AD, a bit impulsive and needs cues to actually slow himself down.                           ADL either performed or assessed with clinical judgement   ADL Overall ADL's : Needs assistance/impaired Eating/Feeding: Independent;Sitting   Grooming: Standing;Contact guard assist Grooming Details (indicate cue type and reason): Educated pt  on compensatory sink strategies for ADLs. Upper Body Bathing: Sitting;Set up;Supervision/ safety   Lower Body Bathing: Sitting/lateral leans;Minimal assistance    Upper Body Dressing : Sitting;Set up Upper Body Dressing Details (indicate cue type and reason): Educated pt on UBD while maintaining C spine precautions. Lower Body Dressing: Moderate assistance;Sit to/from stand Lower Body Dressing Details (indicate cue type and reason): Pt demonstrated ability to doff/don shoes without physical assist, more assist needed with pants/underwear to maintain precautions. Toilet Transfer: Ambulation;Rolling walker (2 wheels);Contact guard assist   Toileting- Clothing Manipulation and Hygiene: Contact guard assist;Sit to/from stand Toileting - Clothing Manipulation Details (indicate cue type and reason): Educated pt on wiping strategies.     Functional mobility during ADLs: Contact guard assist;Rolling walker (2 wheels) General ADL Comments: Pt negotiated 2 flights of stairs with CGA, educated pt spouse on use of gait belt for safety if needed. Demonstrated car transfer, pt observed getting into transport chair while following same technique.     Vision   Vision Assessment?: No apparent visual deficits     Perception         Praxis         Pertinent Vitals/Pain Pain Assessment Pain Assessment: Faces Faces Pain Scale: Hurts a little bit Pain Location: neck incision site Pain Descriptors / Indicators: Sore Pain Intervention(s): Limited activity within patient's tolerance, Monitored during session     Extremity/Trunk Assessment Upper Extremity Assessment Upper Extremity Assessment: Overall WFL for tasks assessed   Lower Extremity Assessment Lower Extremity Assessment: Overall WFL for tasks assessed   Cervical / Trunk Assessment Cervical / Trunk Assessment: Neck Surgery   Communication Communication Communication: No apparent difficulties   Cognition Arousal: Alert Behavior During Therapy: Restless, Impulsive Cognition: Cognition impaired     Awareness: Intellectual awareness intact, Online awareness impaired Memory impairment (select  all impairments): Short-term memory, Working memory Attention impairment (select first level of impairment): Sustained attention, Focused attention Executive functioning impairment (select all impairments): Reasoning, Problem solving OT - Cognition Comments: Pt with poor recall of precautions, impulsive mobility and needs several cues for safety. Pt was stopped from efforts of pushing up on RW and leaning on it, per spouse the pt has some cognitive deficits at baseline.                 Following commands: Impaired Following commands impaired: Follows one step commands with increased time, Follows multi-step commands inconsistently     Cueing  General Comments   Cueing Techniques: Verbal cues;Gestural cues  VSS; Pt spouse present and attentive.   Exercises     Shoulder Instructions      Home Living Family/patient expects to be discharged to:: Private residence Living Arrangements: Spouse/significant other Available Help at Discharge: Family;Available 24 hours/day Type of Home: House Home Access: Stairs to enter [employer] of Steps: 2 through garage   Home Layout: Two level;1/2 bath on main level;Bed/bath upstairs;Other (Comment) (recliner to sleep on main level) Alternate Level Stairs-Number of Steps: flight + landing + 3 Alternate Level Stairs-Rails: Right Bathroom Shower/Tub: Producer, television/film/video: Standard     Home Equipment: Shower seat;Hand held shower head   Additional Comments: Pt spouse notes that pt has had 3 witnessed falls and some possible slips in the bedroom      Prior Functioning/Environment Prior Level of Function : History of Falls (last six months);Needs assist             Mobility Comments: Pt spouse reports recently having to physically assist  pt to transfer from recliner ADLs Comments: Sposue assists with bathing/dressing and iADLs    OT Problem List: Decreased strength;Impaired balance (sitting and/or  standing);Decreased safety awareness;Decreased knowledge of precautions;Pain;Decreased cognition;Decreased knowledge of use of DME or AE   OT Treatment/Interventions: Self-care/ADL training;Therapeutic exercise;Patient/family education;Balance training;Therapeutic activities;DME and/or AE instruction      OT Goals(Current goals can be found in the care plan section)   Acute Rehab OT Goals Patient Stated Goal: To go home OT Goal Formulation: With patient Time For Goal Achievement: 05/15/24 Potential to Achieve Goals: Good ADL Goals Pt Will Perform Grooming: with modified independence;standing Pt Will Perform Lower Body Bathing: with supervision;with set-up;sitting/lateral leans;with adaptive equipment Pt Will Perform Lower Body Dressing: with set-up;with adaptive equipment;sit to/from stand;sitting/lateral leans Pt Will Perform Toileting - Clothing Manipulation and hygiene: with modified independence;sitting/lateral leans Additional ADL Goal #1: Pt will verablize understanding of C spine precautions without cueing. Additional ADL Goal #2: Pt will demonstrate independent management of soft collar cervical brace   OT Frequency:  Min 2X/week    Co-evaluation              AM-PAC OT "6 Clicks" Daily Activity     Outcome Measure Help from another person eating meals?: None Help from another person taking care of personal grooming?: A Little Help from another person toileting, which includes using toliet, bedpan, or urinal?: A Little Help from another person bathing (including washing, rinsing, drying)?: A Lot Help from another person to put on and taking off regular upper body clothing?: A Little Help from another person to put on and taking off regular lower body clothing?: A Lot 6 Click Score: 17   End of Session Equipment Utilized During Treatment: Gait belt;Rolling walker (2 wheels) Nurse Communication: Mobility status  Activity Tolerance: Patient tolerated treatment  well Patient left: in bed;with call bell/phone within reach;with family/visitor present  OT Visit Diagnosis: Unsteadiness on feet (R26.81);Pain Pain - part of body:  (neck)                Time: 1610-9604 OT Time Calculation (min): 39 min Charges:  OT General Charges $OT Visit: 1 Visit OT Evaluation $OT Eval Moderate Complexity: 1 Mod OT Treatments $Self Care/Home Management : 8-22 mins $Therapeutic Activity: 8-22 mins  05/01/2024  AB, OTR/L  Acute Rehabilitation Services  Office: [phone]   Jorene New 05/01/2024, 7:00 PM

## 2024-05-02 ENCOUNTER — Encounter (HOSPITAL_COMMUNITY): Payer: Self-pay | Admitting: Neurological Surgery

## 2024-05-02 NOTE — Progress Notes (Signed)
 05/02/2024 at 10:30 am.  Pt arranged with [facility] home health for [facility] PT/OT. CM has provided this information to pts family.

## 2024-05-07 ENCOUNTER — Emergency Department (HOSPITAL_COMMUNITY)

## 2024-05-07 ENCOUNTER — Encounter (HOSPITAL_COMMUNITY): Payer: Self-pay

## 2024-05-07 ENCOUNTER — Other Ambulatory Visit: Payer: Self-pay

## 2024-05-07 ENCOUNTER — Inpatient Hospital Stay (HOSPITAL_COMMUNITY)
Admission: EM | Admit: 2024-05-07 | Discharge: 2024-05-22 | DRG: 057 | Disposition: A | Discharge status: Home Health | Attending: Internal Medicine | Admitting: Internal Medicine

## 2024-05-07 DIAGNOSIS — E10319 Type 1 diabetes mellitus with unspecified diabetic retinopathy without macular edema: Secondary | ICD-10-CM | POA: Diagnosis present

## 2024-05-07 DIAGNOSIS — E66811 Obesity, class 1: Secondary | ICD-10-CM | POA: Diagnosis present

## 2024-05-07 DIAGNOSIS — R413 Other amnesia: Secondary | ICD-10-CM

## 2024-05-07 DIAGNOSIS — R4182 Altered mental status, unspecified: Secondary | ICD-10-CM | POA: Diagnosis not present

## 2024-05-07 DIAGNOSIS — G3183 Dementia with Lewy bodies: Principal | ICD-10-CM | POA: Diagnosis present

## 2024-05-07 DIAGNOSIS — Z981 Arthrodesis status: Secondary | ICD-10-CM

## 2024-05-07 DIAGNOSIS — G4733 Obstructive sleep apnea (adult) (pediatric): Secondary | ICD-10-CM | POA: Diagnosis present

## 2024-05-07 DIAGNOSIS — R296 Repeated falls: Secondary | ICD-10-CM | POA: Diagnosis present

## 2024-05-07 DIAGNOSIS — G9761 Postprocedural hematoma of a nervous system organ or structure following a nervous system procedure: Secondary | ICD-10-CM | POA: Diagnosis present

## 2024-05-07 DIAGNOSIS — R531 Weakness: Secondary | ICD-10-CM | POA: Diagnosis present

## 2024-05-07 DIAGNOSIS — Z85820 Personal history of malignant melanoma of skin: Secondary | ICD-10-CM

## 2024-05-07 DIAGNOSIS — F411 Generalized anxiety disorder: Secondary | ICD-10-CM | POA: Diagnosis present

## 2024-05-07 DIAGNOSIS — R21 Rash and other nonspecific skin eruption: Secondary | ICD-10-CM | POA: Diagnosis not present

## 2024-05-07 DIAGNOSIS — M4802 Spinal stenosis, cervical region: Secondary | ICD-10-CM

## 2024-05-07 DIAGNOSIS — S064XAA Epidural hemorrhage with loss of consciousness status unknown, initial encounter: Secondary | ICD-10-CM

## 2024-05-07 DIAGNOSIS — G9341 Metabolic encephalopathy: Principal | ICD-10-CM

## 2024-05-07 DIAGNOSIS — Z87442 Personal history of urinary calculi: Secondary | ICD-10-CM

## 2024-05-07 DIAGNOSIS — E441 Mild protein-calorie malnutrition: Secondary | ICD-10-CM | POA: Diagnosis not present

## 2024-05-07 DIAGNOSIS — R1310 Dysphagia, unspecified: Secondary | ICD-10-CM | POA: Diagnosis present

## 2024-05-07 DIAGNOSIS — F0283 Dementia in other diseases classified elsewhere, unspecified severity, with mood disturbance: Secondary | ICD-10-CM | POA: Diagnosis present

## 2024-05-07 DIAGNOSIS — Z9641 Presence of insulin pump (external) (internal): Secondary | ICD-10-CM | POA: Diagnosis present

## 2024-05-07 DIAGNOSIS — Z825 Family history of asthma and other chronic lower respiratory diseases: Secondary | ICD-10-CM

## 2024-05-07 DIAGNOSIS — Z79890 Hormone replacement therapy (postmenopausal): Secondary | ICD-10-CM

## 2024-05-07 DIAGNOSIS — F02811 Dementia in other diseases classified elsewhere, unspecified severity, with agitation: Secondary | ICD-10-CM | POA: Diagnosis present

## 2024-05-07 DIAGNOSIS — E1010 Type 1 diabetes mellitus with ketoacidosis without coma: Secondary | ICD-10-CM | POA: Diagnosis not present

## 2024-05-07 DIAGNOSIS — Z91040 Latex allergy status: Secondary | ICD-10-CM

## 2024-05-07 DIAGNOSIS — Z8249 Family history of ischemic heart disease and other diseases of the circulatory system: Secondary | ICD-10-CM

## 2024-05-07 DIAGNOSIS — G894 Chronic pain syndrome: Secondary | ICD-10-CM | POA: Diagnosis present

## 2024-05-07 DIAGNOSIS — R270 Ataxia, unspecified: Secondary | ICD-10-CM | POA: Diagnosis present

## 2024-05-07 DIAGNOSIS — F05 Delirium due to known physiological condition: Secondary | ICD-10-CM | POA: Diagnosis present

## 2024-05-07 DIAGNOSIS — E785 Hyperlipidemia, unspecified: Secondary | ICD-10-CM | POA: Diagnosis present

## 2024-05-07 DIAGNOSIS — Z79899 Other long term (current) drug therapy: Secondary | ICD-10-CM

## 2024-05-07 DIAGNOSIS — Z8349 Family history of other endocrine, nutritional and metabolic diseases: Secondary | ICD-10-CM

## 2024-05-07 DIAGNOSIS — E119 Type 2 diabetes mellitus without complications: Secondary | ICD-10-CM

## 2024-05-07 DIAGNOSIS — R338 Other retention of urine: Secondary | ICD-10-CM | POA: Diagnosis present

## 2024-05-07 DIAGNOSIS — Z794 Long term (current) use of insulin: Secondary | ICD-10-CM

## 2024-05-07 DIAGNOSIS — I10 Essential (primary) hypertension: Secondary | ICD-10-CM | POA: Diagnosis present

## 2024-05-07 DIAGNOSIS — M797 Fibromyalgia: Secondary | ICD-10-CM | POA: Diagnosis present

## 2024-05-07 DIAGNOSIS — R000 Tachycardia, unspecified: Secondary | ICD-10-CM | POA: Diagnosis not present

## 2024-05-07 DIAGNOSIS — Z87898 Personal history of other specified conditions: Secondary | ICD-10-CM

## 2024-05-07 DIAGNOSIS — Z87891 Personal history of nicotine dependence: Secondary | ICD-10-CM

## 2024-05-07 DIAGNOSIS — R443 Hallucinations, unspecified: Secondary | ICD-10-CM | POA: Diagnosis present

## 2024-05-07 DIAGNOSIS — N401 Enlarged prostate with lower urinary tract symptoms: Secondary | ICD-10-CM | POA: Diagnosis present

## 2024-05-07 DIAGNOSIS — I160 Hypertensive urgency: Secondary | ICD-10-CM | POA: Diagnosis present

## 2024-05-07 DIAGNOSIS — F0284 Dementia in other diseases classified elsewhere, unspecified severity, with anxiety: Secondary | ICD-10-CM | POA: Diagnosis present

## 2024-05-07 DIAGNOSIS — Z781 Physical restraint status: Secondary | ICD-10-CM

## 2024-05-07 DIAGNOSIS — J45909 Unspecified asthma, uncomplicated: Secondary | ICD-10-CM | POA: Diagnosis present

## 2024-05-07 DIAGNOSIS — E1110 Type 2 diabetes mellitus with ketoacidosis without coma: Secondary | ICD-10-CM | POA: Diagnosis present

## 2024-05-07 DIAGNOSIS — Z6831 Body mass index (BMI) 31.0-31.9, adult: Secondary | ICD-10-CM

## 2024-05-07 DIAGNOSIS — R2681 Unsteadiness on feet: Secondary | ICD-10-CM

## 2024-05-07 DIAGNOSIS — Z887 Allergy status to serum and vaccine status: Secondary | ICD-10-CM

## 2024-05-07 DIAGNOSIS — F32A Depression, unspecified: Secondary | ICD-10-CM | POA: Diagnosis present

## 2024-05-07 DIAGNOSIS — R410 Disorientation, unspecified: Principal | ICD-10-CM

## 2024-05-07 DIAGNOSIS — E10649 Type 1 diabetes mellitus with hypoglycemia without coma: Secondary | ICD-10-CM | POA: Diagnosis not present

## 2024-05-07 DIAGNOSIS — G3184 Mild cognitive impairment, so stated: Secondary | ICD-10-CM | POA: Insufficient documentation

## 2024-05-07 DIAGNOSIS — E039 Hypothyroidism, unspecified: Secondary | ICD-10-CM | POA: Diagnosis present

## 2024-05-07 DIAGNOSIS — E1042 Type 1 diabetes mellitus with diabetic polyneuropathy: Secondary | ICD-10-CM | POA: Diagnosis present

## 2024-05-07 LAB — DIFFERENTIAL
Abs Immature Granulocytes: 0.07 10*3/uL (ref 0.00–0.07)
Basophils Absolute: 0.1 10*3/uL (ref 0.0–0.1)
Basophils Relative: 1 %
Eosinophils Absolute: 0.2 10*3/uL (ref 0.0–0.5)
Eosinophils Relative: 2 %
Immature Granulocytes: 1 %
Lymphocytes Relative: 15 %
Lymphs Abs: 1.8 10*3/uL (ref 0.7–4.0)
Monocytes Absolute: 1.2 10*3/uL — ABNORMAL HIGH (ref 0.1–1.0)
Monocytes Relative: 10 %
Neutro Abs: 8.5 10*3/uL — ABNORMAL HIGH (ref 1.7–7.7)
Neutrophils Relative %: 71 %

## 2024-05-07 LAB — RAPID URINE DRUG SCREEN, HOSP PERFORMED
Amphetamines: NOT DETECTED
Barbiturates: NOT DETECTED
Benzodiazepines: NOT DETECTED
Cocaine: NOT DETECTED
Opiates: NOT DETECTED
Tetrahydrocannabinol: NOT DETECTED

## 2024-05-07 LAB — CBC
HCT: 47.8 % (ref 39.0–52.0)
Hemoglobin: 15.8 g/dL (ref 13.0–17.0)
MCH: 29.7 pg (ref 26.0–34.0)
MCHC: 33.1 g/dL (ref 30.0–36.0)
MCV: 89.8 fL (ref 80.0–100.0)
Platelets: 347 10*3/uL (ref 150–400)
RBC: 5.32 MIL/uL (ref 4.22–5.81)
RDW: 12.8 % (ref 11.5–15.5)
WBC: 11.9 10*3/uL — ABNORMAL HIGH (ref 4.0–10.5)
nRBC: 0 % (ref 0.0–0.2)

## 2024-05-07 LAB — PROTIME-INR
INR: 1 (ref 0.8–1.2)
Prothrombin Time: 13.5 s (ref 11.4–15.2)

## 2024-05-07 LAB — COMPREHENSIVE METABOLIC PANEL WITH GFR
ALT: 38 U/L (ref 0–44)
AST: 30 U/L (ref 15–41)
Albumin: 4.1 g/dL (ref 3.5–5.0)
Alkaline Phosphatase: 82 U/L (ref 38–126)
Anion gap: 11 (ref 5–15)
BUN: 10 mg/dL (ref 8–23)
CO2: 26 mmol/L (ref 22–32)
Calcium: 9.3 mg/dL (ref 8.9–10.3)
Chloride: 99 mmol/L (ref 98–111)
Creatinine, Ser: 0.49 mg/dL — ABNORMAL LOW (ref 0.61–1.24)
GFR, Estimated: >60 mL/min (ref >60)
Glucose, Bld: 144 mg/dL — ABNORMAL HIGH (ref 70–99)
Potassium: 4 mmol/L (ref 3.5–5.1)
Sodium: 136 mmol/L (ref 135–145)
Total Bilirubin: 0.7 mg/dL (ref 0.0–1.2)
Total Protein: 7.9 g/dL (ref 6.5–8.1)

## 2024-05-07 LAB — APTT: aPTT: 30 s (ref 24–36)

## 2024-05-07 LAB — ETHANOL: Alcohol, Ethyl (B): <15 mg/dL (ref <15)

## 2024-05-07 MED ORDER — ACETAMINOPHEN 325 MG PO TABS
650.0000 mg | ORAL_TABLET | Freq: Once | ORAL | Status: AC
Start: 2024-05-07 — End: 2024-05-07
  Administered 2024-05-07: 650 mg via ORAL
  Filled 2024-05-07: qty 2

## 2024-05-07 MED ORDER — FENTANYL CITRATE PF 50 MCG/ML IJ SOSY
50.0000 ug | PREFILLED_SYRINGE | Freq: Once | INTRAMUSCULAR | Status: AC
  Administered 2024-05-07: 50 ug via INTRAVENOUS
  Filled 2024-05-07: qty 1

## 2024-05-07 NOTE — ED Provider Triage Note (Addendum)
 Emergency Medicine Provider Triage Evaluation Note  Willie [name] , a 63 y.o. male  was evaluated in triage.  Pt complains of AMS.  Review of Systems  Positive:  Negative:   Physical Exam  BP (!) 141/87   Pulse 89   Temp 97.7 F (36.5 C) (Oral)   Resp 18   Ht 5\' 9"  (1.753 m)   Wt 97.1 kg   SpO2 99%   BMI 31.60 kg/m  Gen:   Awake, no distress   Resp:  Normal effort  MSK:   Moves extremities without difficulty  Other:    Medical Decision Making  Medically screening exam initiated at 5:22 PM.  Appropriate orders placed.  Willie [name] was informed that the remainder of the evaluation will be completed by another provider, this initial triage assessment does not replace that evaluation, and the importance of remaining in the ED until their evaluation is complete.  Family member concerned for lewy body dementia. Patient with multiple falls recently and anxious. Patient can't sleep. Patient leaving front door open. Patient got combative with family member as well - she is concerned for her safety. Family member stating that patient is hallucinating. Family member stating that this started after getting propofol  last week for surgery.  Patient stating that all of this is made up. Patient very upset with family member and arguing with her during triage.        Dorisann Garre F, [place] 05/07/24 1732

## 2024-05-07 NOTE — ED Provider Notes (Signed)
 [place] EMERGENCY DEPARTMENT AT [facility] Provider Note   CSN: [id number] Arrival date & time: 05/07/24  1544     History  Chief Complaint  Patient presents with   Memory Loss    Willie [name] is a 63 y.o. male.  HPI   Patient has history of diabetes hyperlipidemia memory issues obesity anxiety depression cervical spine disease.  Patient underwent a spinal fusion on May 7.  Patient was prescribed muscle relaxants as well as pain medications oxycodone.  Family states since the surgery the patient has had trouble with increasing confusion.  At night he is woken up thinking people were in the house.  It seems to get worse at night and he has been getting more confused.  Patient has not had any fevers.  No headaches.  Was taking tizanidine  but that was stopped.  He is still taking Flexeril .  He continues have some pain in his neck.Patient is scheduled to see a neurologist but is not until later this month  Home Medications Prior to Admission medications   Medication Sig Start Date End Date Taking? Authorizing Provider  albuterol  (VENTOLIN  HFA) 108 (90 Base) MCG/ACT inhaler Inhale 2 puffs into the lungs every 4 (four) hours as needed for wheezing or shortness of breath. 08/21/20   Byrum, Delora Ferry, MD  amitriptyline  (ELAVIL ) 50 MG tablet Take 50 mg by mouth daily. 01/02/17   [provider]  Ascorbic Acid (VITAMIN C) 1000 MG tablet Take 1,000 mg by mouth daily.    [provider]  atenolol  (TENORMIN ) 50 MG tablet Take 150 mg by mouth at bedtime. 12/13/16   [provider]  buPROPion  (WELLBUTRIN  SR) 150 MG 12 hr tablet Take 150 mg by mouth 2 (two) times daily.  12/16/16   [provider]  busPIRone  (BUSPAR ) 10 MG tablet Take 10 mg by mouth 2 (two) times daily. 11/14/16   [provider]  clobetasol  (TEMOVATE ) 0.05 % external solution Apply 1 Application topically daily. 02/28/24   [provider]  ezetimibe  (ZETIA ) 10 MG tablet  Take 10 mg by mouth daily. 02/26/24   [provider]  gabapentin  (NEURONTIN ) 300 MG capsule Take 300 mg by mouth 2 (two) times daily.    [provider]  HYDROcodone -acetaminophen  (NORCO) 10-325 MG tablet Take 1 tablet by mouth every 4 (four) hours as needed for moderate pain (pain score 4-6). 05/01/24   Joaquin Mulberry, MD  Insulin  NPH, Human,, Isophane, (NOVOLIN N FLEXPEN) 100 UNIT/ML Kiwkpen Inject into the skin as needed.    [provider]  insulin  regular (NOVOLIN R) 100 units/mL injection Inject into the skin 3 (three) times daily as needed for high blood sugar.    [provider]  latanoprost  (XALATAN ) 0.005 % ophthalmic solution Place 1 drop into both eyes at bedtime.    [provider]  levothyroxine  (SYNTHROID ) 112 MCG tablet Take 200 mcg by mouth 2 (two) times daily. 12/23/16   [provider]  NOVOLOG  100 UNIT/ML injection continuous. Insulin  pump    [provider]  omeprazole  (PRILOSEC) 40 MG capsule TAKE ONE CAPSULE BY MOUTH EVERY MORNING AND EVERY EVENING 10/04/21   Denson Flake, MD  rosuvastatin (CRESTOR) 10 MG tablet Take 10 mg by mouth 3 (three) times a week. 10/12/16   [provider]  tiZANidine  (ZANAFLEX ) 4 MG tablet Take 1 tablet (4 mg total) by mouth every 8 (eight) hours as needed for muscle spasms. 05/01/24   Joaquin Mulberry, MD  traMADol (ULTRAM) 50 MG tablet Take 50 mg by mouth 3 (three) times daily. 10/17/16   [provider]  vitamin B-12 (CYANOCOBALAMIN) 1000 MCG tablet Take 1,000 mcg by mouth daily.    [provider]      Allergies    Other and Latex    Review of Systems   Review of Systems  Physical Exam Updated Vital Signs BP 129/87 (BP Location: Right Arm)   Pulse 84   Temp 98 F (36.7 C) (Oral)   Resp 18   Ht 1.753 m (5\' 9" )   Wt 97.1 kg   SpO2 97%   BMI 31.60 kg/m  Physical Exam Vitals and nursing note reviewed.  Constitutional:      Appearance: He  is well-developed. He is not ill-appearing or diaphoretic.  HENT:     Head: Normocephalic and atraumatic.     Right Ear: External ear normal.     Left Ear: External ear normal.  Eyes:     General: No scleral icterus.       Right eye: No discharge.        Left eye: No discharge.     Conjunctiva/sclera: Conjunctivae normal.  Neck:     Trachea: No tracheal deviation.  Cardiovascular:     Rate and Rhythm: Normal rate and regular rhythm.  Pulmonary:     Effort: Pulmonary effort is normal. No respiratory distress.     Breath sounds: Normal breath sounds. No stridor. No wheezing or rales.  Abdominal:     General: Bowel sounds are normal. There is no distension.     Palpations: Abdomen is soft.     Tenderness: There is no abdominal tenderness. There is no guarding or rebound.  Musculoskeletal:        General: No tenderness or deformity.     Cervical back: Neck supple.  Skin:    General: Skin is warm and dry.     Findings: No rash.  Neurological:     General: No focal deficit present.     Mental Status: He is alert.     Cranial Nerves: No cranial nerve deficit, dysarthria or facial asymmetry.     Sensory: No sensory deficit.     Motor: No weakness, abnormal muscle tone or seizure activity.     Coordination: Coordination normal.     Comments: Equal grip strength, normal sensation throughout, no facial droop  Psychiatric:        Mood and Affect: Mood normal.     ED Results / Procedures / Treatments   Labs (all labs ordered are listed, but only abnormal results are displayed) Labs Reviewed  CBC - Abnormal; Notable for the following components:      Result Value   WBC 11.9 (*)    All other components within normal limits  DIFFERENTIAL - Abnormal; Notable for the following components:   Neutro Abs 8.5 (*)    Monocytes Absolute 1.2 (*)    All other components within normal limits  COMPREHENSIVE METABOLIC PANEL WITH GFR - Abnormal; Notable for the following components:   Glucose,  Bld 144 (*)    Creatinine, Ser 0.49 (*)    All other components within normal limits  PROTIME-INR  APTT  ETHANOL  RAPID URINE DRUG SCREEN, HOSP PERFORMED  URINALYSIS, ROUTINE W REFLEX MICROSCOPIC    EKG EKG Interpretation Date/Time:  Tuesday May 07 2024 17:45:12 EDT Ventricular Rate:  91 PR Interval:  158 QRS Duration:  98 QT Interval:  396 QTC Calculation: 488  R Axis:   183  Text Interpretation: Sinus rhythm Right axis deviation Abnormal R-wave progression, late transition Borderline T abnormalities, anterior leads Borderline prolonged QT interval No significant change since last tracing Confirmed by Trish Furl [phone]) on 05/07/2024 11:19:31 PM  Radiology CT Head Wo Contrast Result Date: 05/07/2024 CLINICAL DATA:  Mental status change, unknown cause EXAM: CT HEAD WITHOUT CONTRAST TECHNIQUE: Contiguous axial images were obtained from the base of the skull through the vertex without intravenous contrast. RADIATION DOSE REDUCTION: This exam was performed according to the departmental dose-optimization program which includes automated exposure control, adjustment of the mA and/or kV according to patient size and/or use of iterative reconstruction technique. COMPARISON:  None Available. FINDINGS: Brain: There is periventricular white matter decreased attenuation consistent with small vessel ischemic changes. Ventricles, sulci and cisterns are prominent consistent with age related involutional changes. No acute intracranial hemorrhage, mass effect or shift. No hydrocephalus. Vascular: No hyperdense vessel or unexpected calcification. Skull: Normal. Negative for fracture or focal lesion. Sinuses/Orbits: No acute finding. IMPRESSION: Atrophy and chronic small vessel ischemic changes. No acute intracranial process identified. Electronically Signed   By: Sydell Eva M.D.   On: 05/07/2024 18:53    Procedures Procedures    Medications Ordered in ED Medications  acetaminophen  (TYLENOL )  tablet 650 mg (650 mg Oral Given 05/07/24 1825)  fentaNYL  (SUBLIMAZE ) injection 50 mcg (50 mcg Intravenous Given 05/07/24 2218)    ED Course/ Medical Decision Making/ A&P                                 Medical Decision Making Amount and/or Complexity of Data Reviewed Radiology: ordered.  Risk Prescription drug management.   Patient presented to the ED for changes in his behavior.  Patient was also having hallucination.  Patient recently underwent spinal surgery.  No signs of acute infection at this time.  No significant electrolyte abnormalities.  CT does not show any acute changes.  Considered MRI to rule out occult stroke causing his symptoms.  MRI not available at this time night.  Concern about possible metabolic causes for his behavior change.  Possible medication related,  ?occult stroke. Will plan on admission, mri in the am.  Dr Monique Ano to follow up on CT scan of c spine which is pending        Final Clinical Impression(s) / ED Diagnoses Final diagnoses:  None    Rx / DC Orders ED Discharge Orders     None         Trish Furl, MD 05/07/24 2347
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
## 2024-05-07 NOTE — ED Notes (Signed)
 Called MRI to get a status on patient MRI to be completed that was ordered at 1955.

## 2024-05-07 NOTE — ED Triage Notes (Signed)
 Pt normally has some short term memory issues. Pt had surgery on his vertebrae last week, since surgery his memory has been worse. Alert and orientedx4 on arrival.

## 2024-05-07 NOTE — ED Notes (Signed)
 Pt stated that he was not able to urinate.    Will try again later.

## 2024-05-08 ENCOUNTER — Encounter (HOSPITAL_COMMUNITY): Payer: Self-pay | Admitting: Internal Medicine

## 2024-05-08 ENCOUNTER — Inpatient Hospital Stay (HOSPITAL_COMMUNITY)

## 2024-05-08 DIAGNOSIS — E119 Type 2 diabetes mellitus without complications: Secondary | ICD-10-CM

## 2024-05-08 DIAGNOSIS — E1110 Type 2 diabetes mellitus with ketoacidosis without coma: Secondary | ICD-10-CM | POA: Diagnosis present

## 2024-05-08 DIAGNOSIS — R410 Disorientation, unspecified: Secondary | ICD-10-CM | POA: Diagnosis not present

## 2024-05-08 DIAGNOSIS — M4802 Spinal stenosis, cervical region: Secondary | ICD-10-CM

## 2024-05-08 DIAGNOSIS — R4182 Altered mental status, unspecified: Secondary | ICD-10-CM | POA: Diagnosis present

## 2024-05-08 DIAGNOSIS — E039 Hypothyroidism, unspecified: Secondary | ICD-10-CM | POA: Insufficient documentation

## 2024-05-08 DIAGNOSIS — S064XAA Epidural hemorrhage with loss of consciousness status unknown, initial encounter: Secondary | ICD-10-CM | POA: Diagnosis not present

## 2024-05-08 DIAGNOSIS — R569 Unspecified convulsions: Secondary | ICD-10-CM | POA: Diagnosis not present

## 2024-05-08 DIAGNOSIS — I160 Hypertensive urgency: Secondary | ICD-10-CM | POA: Diagnosis present

## 2024-05-08 DIAGNOSIS — E10319 Type 1 diabetes mellitus with unspecified diabetic retinopathy without macular edema: Secondary | ICD-10-CM | POA: Diagnosis present

## 2024-05-08 DIAGNOSIS — Z794 Long term (current) use of insulin: Secondary | ICD-10-CM

## 2024-05-08 DIAGNOSIS — R29818 Other symptoms and signs involving the nervous system: Secondary | ICD-10-CM | POA: Diagnosis not present

## 2024-05-08 DIAGNOSIS — G9341 Metabolic encephalopathy: Principal | ICD-10-CM

## 2024-05-08 DIAGNOSIS — F32A Depression, unspecified: Secondary | ICD-10-CM | POA: Diagnosis present

## 2024-05-08 DIAGNOSIS — F411 Generalized anxiety disorder: Secondary | ICD-10-CM | POA: Diagnosis present

## 2024-05-08 DIAGNOSIS — R413 Other amnesia: Secondary | ICD-10-CM | POA: Diagnosis not present

## 2024-05-08 DIAGNOSIS — F0284 Dementia in other diseases classified elsewhere, unspecified severity, with anxiety: Secondary | ICD-10-CM | POA: Diagnosis present

## 2024-05-08 DIAGNOSIS — G3183 Dementia with Lewy bodies: Secondary | ICD-10-CM | POA: Diagnosis present

## 2024-05-08 DIAGNOSIS — G3184 Mild cognitive impairment, so stated: Secondary | ICD-10-CM | POA: Insufficient documentation

## 2024-05-08 DIAGNOSIS — I10 Essential (primary) hypertension: Secondary | ICD-10-CM | POA: Diagnosis present

## 2024-05-08 DIAGNOSIS — E785 Hyperlipidemia, unspecified: Secondary | ICD-10-CM | POA: Diagnosis present

## 2024-05-08 DIAGNOSIS — Z87898 Personal history of other specified conditions: Secondary | ICD-10-CM

## 2024-05-08 DIAGNOSIS — E1042 Type 1 diabetes mellitus with diabetic polyneuropathy: Secondary | ICD-10-CM | POA: Diagnosis present

## 2024-05-08 DIAGNOSIS — E10649 Type 1 diabetes mellitus with hypoglycemia without coma: Secondary | ICD-10-CM | POA: Diagnosis not present

## 2024-05-08 DIAGNOSIS — J45909 Unspecified asthma, uncomplicated: Secondary | ICD-10-CM | POA: Diagnosis present

## 2024-05-08 DIAGNOSIS — R339 Retention of urine, unspecified: Secondary | ICD-10-CM | POA: Diagnosis not present

## 2024-05-08 DIAGNOSIS — G9761 Postprocedural hematoma of a nervous system organ or structure following a nervous system procedure: Secondary | ICD-10-CM | POA: Diagnosis present

## 2024-05-08 DIAGNOSIS — R443 Hallucinations, unspecified: Secondary | ICD-10-CM | POA: Diagnosis present

## 2024-05-08 DIAGNOSIS — E66811 Obesity, class 1: Secondary | ICD-10-CM | POA: Diagnosis present

## 2024-05-08 DIAGNOSIS — N401 Enlarged prostate with lower urinary tract symptoms: Secondary | ICD-10-CM | POA: Diagnosis present

## 2024-05-08 DIAGNOSIS — E1310 Other specified diabetes mellitus with ketoacidosis without coma: Secondary | ICD-10-CM | POA: Diagnosis not present

## 2024-05-08 DIAGNOSIS — R4189 Other symptoms and signs involving cognitive functions and awareness: Secondary | ICD-10-CM | POA: Diagnosis not present

## 2024-05-08 DIAGNOSIS — M797 Fibromyalgia: Secondary | ICD-10-CM | POA: Diagnosis present

## 2024-05-08 DIAGNOSIS — G894 Chronic pain syndrome: Secondary | ICD-10-CM | POA: Diagnosis present

## 2024-05-08 DIAGNOSIS — E441 Mild protein-calorie malnutrition: Secondary | ICD-10-CM | POA: Diagnosis not present

## 2024-05-08 DIAGNOSIS — F05 Delirium due to known physiological condition: Secondary | ICD-10-CM | POA: Diagnosis present

## 2024-05-08 DIAGNOSIS — R2681 Unsteadiness on feet: Secondary | ICD-10-CM

## 2024-05-08 DIAGNOSIS — F02811 Dementia in other diseases classified elsewhere, unspecified severity, with agitation: Secondary | ICD-10-CM | POA: Diagnosis present

## 2024-05-08 DIAGNOSIS — E1010 Type 1 diabetes mellitus with ketoacidosis without coma: Secondary | ICD-10-CM | POA: Diagnosis not present

## 2024-05-08 DIAGNOSIS — Z9889 Other specified postprocedural states: Secondary | ICD-10-CM

## 2024-05-08 DIAGNOSIS — F0283 Dementia in other diseases classified elsewhere, unspecified severity, with mood disturbance: Secondary | ICD-10-CM | POA: Diagnosis present

## 2024-05-08 DIAGNOSIS — R509 Fever, unspecified: Secondary | ICD-10-CM | POA: Diagnosis not present

## 2024-05-08 LAB — BASIC METABOLIC PANEL WITH GFR
Anion gap: 18 — ABNORMAL HIGH (ref 5–15)
Anion gap: 16 — ABNORMAL HIGH (ref 5–15)
Anion gap: 9 (ref 5–15)
Anion gap: 9 (ref 5–15)
BUN: 16 mg/dL (ref 8–23)
BUN: 15 mg/dL (ref 8–23)
BUN: 13 mg/dL (ref 8–23)
BUN: 12 mg/dL (ref 8–23)
CO2: 16 mmol/L — ABNORMAL LOW (ref 22–32)
CO2: 15 mmol/L — ABNORMAL LOW (ref 22–32)
CO2: 21 mmol/L — ABNORMAL LOW (ref 22–32)
CO2: 23 mmol/L (ref 22–32)
Calcium: 8.9 mg/dL (ref 8.9–10.3)
Calcium: 8.9 mg/dL (ref 8.9–10.3)
Calcium: 8.6 mg/dL — ABNORMAL LOW (ref 8.9–10.3)
Calcium: 8.8 mg/dL — ABNORMAL LOW (ref 8.9–10.3)
Chloride: 94 mmol/L — ABNORMAL LOW (ref 98–111)
Chloride: 99 mmol/L (ref 98–111)
Chloride: 101 mmol/L (ref 98–111)
Chloride: 101 mmol/L (ref 98–111)
Creatinine, Ser: 0.91 mg/dL (ref 0.61–1.24)
Creatinine, Ser: 1.03 mg/dL (ref 0.61–1.24)
Creatinine, Ser: 0.7 mg/dL (ref 0.61–1.24)
Creatinine, Ser: 0.75 mg/dL (ref 0.61–1.24)
GFR, Estimated: >60 mL/min (ref >60)
GFR, Estimated: >60 mL/min (ref >60)
GFR, Estimated: >60 mL/min (ref >60)
GFR, Estimated: >60 mL/min (ref >60)
Glucose, Bld: 369 mg/dL — ABNORMAL HIGH (ref 70–99)
Glucose, Bld: 288 mg/dL — ABNORMAL HIGH (ref 70–99)
Glucose, Bld: 180 mg/dL — ABNORMAL HIGH (ref 70–99)
Glucose, Bld: 151 mg/dL — ABNORMAL HIGH (ref 70–99)
Potassium: 5 mmol/L (ref 3.5–5.1)
Potassium: 6.2 mmol/L — ABNORMAL HIGH (ref 3.5–5.1)
Potassium: 3.8 mmol/L (ref 3.5–5.1)
Potassium: 4 mmol/L (ref 3.5–5.1)
Sodium: 128 mmol/L — ABNORMAL LOW (ref 135–145)
Sodium: 130 mmol/L — ABNORMAL LOW (ref 135–145)
Sodium: 131 mmol/L — ABNORMAL LOW (ref 135–145)
Sodium: 133 mmol/L — ABNORMAL LOW (ref 135–145)

## 2024-05-08 LAB — CBG MONITORING, ED
Glucose-Capillary: 208 mg/dL — ABNORMAL HIGH (ref 70–99)
Glucose-Capillary: 326 mg/dL — ABNORMAL HIGH (ref 70–99)
Glucose-Capillary: 296 mg/dL — ABNORMAL HIGH (ref 70–99)
Glucose-Capillary: 329 mg/dL — ABNORMAL HIGH (ref 70–99)
Glucose-Capillary: 357 mg/dL — ABNORMAL HIGH (ref 70–99)
Glucose-Capillary: 337 mg/dL — ABNORMAL HIGH (ref 70–99)
Glucose-Capillary: 348 mg/dL — ABNORMAL HIGH (ref 70–99)
Glucose-Capillary: 272 mg/dL — ABNORMAL HIGH (ref 70–99)
Glucose-Capillary: 250 mg/dL — ABNORMAL HIGH (ref 70–99)
Glucose-Capillary: 194 mg/dL — ABNORMAL HIGH (ref 70–99)
Glucose-Capillary: 145 mg/dL — ABNORMAL HIGH (ref 70–99)
Glucose-Capillary: 166 mg/dL — ABNORMAL HIGH (ref 70–99)

## 2024-05-08 LAB — URINALYSIS, ROUTINE W REFLEX MICROSCOPIC
Bilirubin Urine: NEGATIVE
Glucose, UA: 150 mg/dL — AB
Hgb urine dipstick: NEGATIVE
Ketones, ur: 5 mg/dL — AB
Leukocytes,Ua: NEGATIVE
Nitrite: NEGATIVE
Protein, ur: NEGATIVE mg/dL
Specific Gravity, Urine: 1.011 (ref 1.005–1.030)
pH: 5 (ref 5.0–8.0)

## 2024-05-08 LAB — GLUCOSE, CAPILLARY
Glucose-Capillary: 155 mg/dL — ABNORMAL HIGH (ref 70–99)
Glucose-Capillary: 156 mg/dL — ABNORMAL HIGH (ref 70–99)
Glucose-Capillary: 189 mg/dL — ABNORMAL HIGH (ref 70–99)
Glucose-Capillary: 176 mg/dL — ABNORMAL HIGH (ref 70–99)
Glucose-Capillary: 178 mg/dL — ABNORMAL HIGH (ref 70–99)
Glucose-Capillary: 164 mg/dL — ABNORMAL HIGH (ref 70–99)
Glucose-Capillary: 158 mg/dL — ABNORMAL HIGH (ref 70–99)

## 2024-05-08 LAB — CBC
HCT: 44.3 % (ref 39.0–52.0)
Hemoglobin: 14.8 g/dL (ref 13.0–17.0)
MCH: 29.9 pg (ref 26.0–34.0)
MCHC: 33.4 g/dL (ref 30.0–36.0)
MCV: 89.5 fL (ref 80.0–100.0)
Platelets: 305 10*3/uL (ref 150–400)
RBC: 4.95 MIL/uL (ref 4.22–5.81)
RDW: 12.9 % (ref 11.5–15.5)
WBC: 13.9 10*3/uL — ABNORMAL HIGH (ref 4.0–10.5)
nRBC: 0 % (ref 0.0–0.2)

## 2024-05-08 LAB — TSH: TSH: 5.075 u[IU]/mL — ABNORMAL HIGH (ref 0.350–4.500)

## 2024-05-08 LAB — COMPREHENSIVE METABOLIC PANEL WITH GFR
ALT: 37 U/L (ref 0–44)
AST: 30 U/L (ref 15–41)
Albumin: 3.8 g/dL (ref 3.5–5.0)
Alkaline Phosphatase: 79 U/L (ref 38–126)
Anion gap: 16 — ABNORMAL HIGH (ref 5–15)
BUN: 13 mg/dL (ref 8–23)
CO2: 18 mmol/L — ABNORMAL LOW (ref 22–32)
Calcium: 9.2 mg/dL (ref 8.9–10.3)
Chloride: 97 mmol/L — ABNORMAL LOW (ref 98–111)
Creatinine, Ser: 0.87 mg/dL (ref 0.61–1.24)
GFR, Estimated: >60 mL/min (ref >60)
Glucose, Bld: 334 mg/dL — ABNORMAL HIGH (ref 70–99)
Potassium: 4.5 mmol/L (ref 3.5–5.1)
Sodium: 131 mmol/L — ABNORMAL LOW (ref 135–145)
Total Bilirubin: 1.8 mg/dL — ABNORMAL HIGH (ref 0.0–1.2)
Total Protein: 7.6 g/dL (ref 6.5–8.1)

## 2024-05-08 LAB — MRSA NEXT GEN BY PCR, NASAL: MRSA by PCR Next Gen: NOT DETECTED

## 2024-05-08 LAB — BETA-HYDROXYBUTYRIC ACID
Beta-Hydroxybutyric Acid: 5.85 mmol/L — ABNORMAL HIGH (ref 0.05–0.27)
Beta-Hydroxybutyric Acid: 3.51 mmol/L — ABNORMAL HIGH (ref 0.05–0.27)
Beta-Hydroxybutyric Acid: 0.67 mmol/L — ABNORMAL HIGH (ref 0.05–0.27)
Beta-Hydroxybutyric Acid: 0.37 mmol/L — ABNORMAL HIGH (ref 0.05–0.27)

## 2024-05-08 LAB — HIV ANTIBODY (ROUTINE TESTING W REFLEX): HIV Screen 4th Generation wRfx: NONREACTIVE

## 2024-05-08 LAB — RPR: RPR Ser Ql: NONREACTIVE

## 2024-05-08 LAB — AMMONIA: Ammonia: 29 umol/L (ref 9–35)

## 2024-05-08 MED ORDER — LACTATED RINGERS IV SOLN: INTRAVENOUS | Status: AC

## 2024-05-08 MED ORDER — OLANZAPINE 10 MG IM SOLR
2.5000 mg | Freq: Once | INTRAMUSCULAR | Status: AC | PRN
  Administered 2024-05-09: 2.5 mg via INTRAMUSCULAR
  Filled 2024-05-08: qty 10

## 2024-05-08 MED ORDER — SODIUM CHLORIDE 0.9% FLUSH: 3.0000 mL | INTRAVENOUS | Status: DC | PRN

## 2024-05-08 MED ORDER — MELATONIN 3 MG PO TABS
3.0000 mg | ORAL_TABLET | Freq: Every day | ORAL | Status: DC
Start: 2024-05-08 — End: 2024-05-15
  Administered 2024-05-13: 3 mg via ORAL
  Filled 2024-05-08 (×3): qty 1

## 2024-05-08 MED ORDER — ONDANSETRON HCL 4 MG/2ML IJ SOLN
4.0000 mg | Freq: Four times a day (QID) | INTRAMUSCULAR | Status: DC | PRN
  Administered 2024-05-08 – 2024-05-14 (×2): 4 mg via INTRAVENOUS
  Filled 2024-05-08 (×2): qty 2

## 2024-05-08 MED ORDER — ACETAMINOPHEN 325 MG PO TABS: 650.0000 mg | ORAL_TABLET | Freq: Four times a day (QID) | ORAL | Status: DC | PRN

## 2024-05-08 MED ORDER — LORAZEPAM 2 MG/ML IJ SOLN
2.0000 mg | INTRAMUSCULAR | Status: DC | PRN
Start: 2024-05-08 — End: 2024-05-08
  Administered 2024-05-08 (×2): 2 mg via INTRAVENOUS
  Filled 2024-05-08 (×2): qty 1

## 2024-05-08 MED ORDER — INSULIN REGULAR(HUMAN) IN NACL 100-0.9 UT/100ML-% IV SOLN
INTRAVENOUS | Status: DC
Start: 2024-05-08 — End: 2024-05-09
  Administered 2024-05-08: 4.6 [IU]/h via INTRAVENOUS
  Administered 2024-05-08: 13 [IU]/h via INTRAVENOUS
  Filled 2024-05-08 (×3): qty 100

## 2024-05-08 MED ORDER — LORAZEPAM 2 MG/ML IJ SOLN
1.0000 mg | Freq: Once | INTRAMUSCULAR | Status: AC
  Administered 2024-05-08: 1 mg via INTRAVENOUS
  Filled 2024-05-08: qty 1

## 2024-05-08 MED ORDER — LACTATED RINGERS IV BOLUS
20.0000 mL/kg | Freq: Once | INTRAVENOUS | Status: AC
  Administered 2024-05-08: 1942 mL via INTRAVENOUS

## 2024-05-08 MED ORDER — ACETAMINOPHEN 10 MG/ML IV SOLN
1000.0000 mg | Freq: Once | INTRAVENOUS | Status: DC
Start: 2024-05-08 — End: 2024-05-08

## 2024-05-08 MED ORDER — LORAZEPAM 2 MG/ML IJ SOLN
4.0000 mg | Freq: Four times a day (QID) | INTRAMUSCULAR | Status: DC | PRN
  Administered 2024-05-08 – 2024-05-10 (×4): 4 mg via INTRAVENOUS
  Filled 2024-05-08 (×4): qty 2

## 2024-05-08 MED ORDER — ORAL CARE MOUTH RINSE
15.0000 mL | OROMUCOSAL | Status: DC | PRN
  Administered 2024-05-09: 15 mL via OROMUCOSAL

## 2024-05-08 MED ORDER — ACETAMINOPHEN 325 MG PO TABS
650.0000 mg | ORAL_TABLET | Freq: Four times a day (QID) | ORAL | Status: DC | PRN
  Administered 2024-05-08: 650 mg via ORAL
  Filled 2024-05-08: qty 2

## 2024-05-08 MED ORDER — SODIUM CHLORIDE 0.9% FLUSH
3.0000 mL | Freq: Two times a day (BID) | INTRAVENOUS | Status: DC
  Administered 2024-05-08 – 2024-05-21 (×27): 3 mL via INTRAVENOUS

## 2024-05-08 MED ORDER — MELATONIN 3 MG PO TABS
3.0000 mg | ORAL_TABLET | Freq: Every day | ORAL | Status: DC
  Administered 2024-05-08: 3 mg via ORAL
  Filled 2024-05-08: qty 1

## 2024-05-08 MED ORDER — DEXTROSE IN LACTATED RINGERS 5 % IV SOLN: INTRAVENOUS | Status: AC

## 2024-05-08 MED ORDER — HALOPERIDOL LACTATE 5 MG/ML IJ SOLN
1.0000 mg | Freq: Four times a day (QID) | INTRAMUSCULAR | Status: DC | PRN
  Administered 2024-05-08 – 2024-05-09 (×4): 1 mg via INTRAVENOUS
  Filled 2024-05-08 (×5): qty 1

## 2024-05-08 MED ORDER — ACETAMINOPHEN 650 MG RE SUPP: 650.0000 mg | Freq: Four times a day (QID) | RECTAL | Status: DC | PRN

## 2024-05-08 MED ORDER — INSULIN ASPART 100 UNIT/ML IJ SOLN
0.0000 [IU] | Freq: Four times a day (QID) | INTRAMUSCULAR | Status: DC
  Administered 2024-05-08: 3 [IU] via SUBCUTANEOUS
  Filled 2024-05-08: qty 0.06

## 2024-05-08 MED ORDER — SODIUM CHLORIDE 0.9 % IV SOLN: 250.0000 mL | INTRAVENOUS | Status: AC | PRN

## 2024-05-08 MED ORDER — ACETAMINOPHEN 10 MG/ML IV SOLN
1000.0000 mg | Freq: Three times a day (TID) | INTRAVENOUS | Status: AC | PRN
  Administered 2024-05-08: 1000 mg via INTRAVENOUS
  Filled 2024-05-08: qty 100

## 2024-05-08 MED ORDER — ONDANSETRON HCL 4 MG PO TABS: 4.0000 mg | ORAL_TABLET | Freq: Four times a day (QID) | ORAL | Status: DC | PRN

## 2024-05-08 MED ORDER — POTASSIUM CHLORIDE 10 MEQ/100ML IV SOLN
10.0000 meq | INTRAVENOUS | Status: AC
  Administered 2024-05-08 (×2): 10 meq via INTRAVENOUS
  Filled 2024-05-08 (×2): qty 100

## 2024-05-08 MED ORDER — ROSUVASTATIN CALCIUM 20 MG PO TABS: 10.0000 mg | ORAL_TABLET | ORAL | Status: DC

## 2024-05-08 MED ORDER — DEXTROSE 50 % IV SOLN: 0.0000 mL | INTRAVENOUS | Status: DC | PRN

## 2024-05-08 MED ORDER — EZETIMIBE 10 MG PO TABS
10.0000 mg | ORAL_TABLET | Freq: Every day | ORAL | Status: DC
Start: 2024-05-08 — End: 2024-05-08

## 2024-05-08 MED ORDER — SODIUM CHLORIDE 0.9% FLUSH
3.0000 mL | Freq: Two times a day (BID) | INTRAVENOUS | Status: DC
  Administered 2024-05-08 – 2024-05-18 (×20): 3 mL via INTRAVENOUS

## 2024-05-08 NOTE — ED Notes (Signed)
 Patient very restless and agitated

## 2024-05-08 NOTE — ED Notes (Signed)
 Patient placed in hand mittens for safety for self and others he remains agitated and continues to try to remove IV and pull at cords. Wife remains at bedside and patient now has Comptroller.

## 2024-05-08 NOTE — ED Notes (Signed)
 Pt very restless. Continuously messing with hand mittens, trying to pull IV out, trying to get out of bed. RN aware. Safety sitter at bedside

## 2024-05-08 NOTE — ED Notes (Signed)
 Pt laying in bed w/ eyes closed, but is still restless. Biting mitts and swinging arms. Spouse visiting. Safety sitter at bedside

## 2024-05-08 NOTE — ED Notes (Signed)
 Provided patient and his wife with beverages and sand which at this time.

## 2024-05-08 NOTE — ED Notes (Signed)
 Wife voiced patient is extremely agitated he is sitting in recliner attempting to remove IV and take IV fluids down

## 2024-05-08 NOTE — ED Notes (Signed)
 Patient called [facility] to give report to [place], Charity fundraiser

## 2024-05-08 NOTE — Consult Note (Signed)
 NEUROLOGY CONSULT NOTE   Date of service: May 08, 2024 Patient Name: Willie [name] MRN:  [id number] DOB:  02-Mar-1961 Chief Complaint: "Altered mental status" Requesting Provider: Donley Furth*  History of Present Illness  Willie [name] is a 63 y.o. male with hx of cognitive difficulties, possible MCI at baseline, anxiety, depression, diabetes, diabetic nephropathy and retinopathy, fibromyalgia, hypertension, hyperlipidemia, sleep apnea, documented history of essential tremor presented to the hospital for evaluation of altered mental status. The patient underwent ACDF with Dr. Rochelle Chu on 05/01/2024 and upon returning back home, was very confused and hallucinating.  Wife reports that he also became very agitated which made her come to the hospital. Upon questioning about the history, she reports that for the past few years he has been walking with a shuffling gait.  He does not have much of a tremor at rest but does have some tremor when he is trying to do fine motor tasks.  She also said that he either sleeps in a separate room or on the recliner because he is extremely restless and moves his legs and arms around quite a lot.  He also mumbles and talks in his sleep. He was evaluated by neurology at some point in 2019 for tremors which were thought to be essential tremors. Wife has noticed a fairly consistent decline over the past few years in terms of his memory and mentation  He was prescribed a muscle relaxant and opiates after the surgery but those medicines have been discontinued now.  ROS  Comprehensive ROS performed and pertinent positives documented in HPI   Past History   Past Medical History:  Diagnosis Date   Anxiety    Arthritis    BPH (benign prostatic hyperplasia)    Depression    Diabetes (HCC)    Diabetic neuropathy (HCC)    Diabetic retinopathy (HCC)    Esophageal stricture    Fibromyalgia    History of kidney stones    Hyperlipidemia    Hypertension     Hypothyroidism    Melanoma (HCC)    OSA (obstructive sleep apnea)    Polyneuropathy    Tremor     Past Surgical History:  Procedure Laterality Date   ANTERIOR CERVICAL DECOMP/DISCECTOMY FUSION N/A 05/01/2024   Procedure: ANTERIOR CERVICAL DECOMPRESSION/DISCECTOMY FUSION  Cervical Five-Cervical Six, remove tether plate Cervical Six-Seven;  Surgeon: Joaquin Mulberry, MD;  Location: [facility] OR;  Service: Neurosurgery;  Laterality: N/A;  ACDF - C3-C4 - C4-C5 - C5-C6, remove tether plate Z3-0   CARPAL TUNNEL RELEASE Bilateral 2002   CATARACT EXTRACTION     CERVICAL DISC SURGERY  2005   CYST EXCISION  2002   middle finger   CYST EXCISION  2007   left thigh   ESOPHAGEAL DILATION     ROTATOR CUFF REPAIR Left    TRIGGER FINGER RELEASE Bilateral     Family History: Family History  Problem Relation Age of Onset   Tremor Mother    Thyroid disease Mother    CAD Father    COPD Father    Other Brother        unknown   Healthy Son     Social History  reports that he has never smoked. He quit smokeless tobacco use about 20 years ago. He reports that he does not currently use alcohol. He reports that he does not use drugs.  Allergies  Allergen Reactions   Other Other (See Comments), Shortness Of Breath and Swelling    Flu  vaccine   Latex Rash    Adhesive     Medications   Current Facility-Administered Medications:    0.9 %  sodium chloride  infusion, 250 mL, Intravenous, PRN, Sundil, Subrina, MD   acetaminophen  (OFIRMEV ) IV 1,000 mg, 1,000 mg, Intravenous, Q8H PRN, Sundil, Subrina, MD, Stopped at 05/08/24 0838   [START ON 05/09/2024] acetaminophen  (TYLENOL ) tablet 650 mg, 650 mg, Oral, Q6H PRN, Sundil, Subrina, MD, 650 mg at 05/08/24 0452   dextrose  5 % in lactated ringers  infusion, , Intravenous, Continuous, Chatterjee, Srobona Tublu, MD   dextrose  50 % solution 0-50 mL, 0-50 mL, Intravenous, PRN, Leann Prom, Srobona Tublu, MD   haloperidol lactate (HALDOL) injection 1 mg, 1 mg,  Intravenous, Q6H PRN, Sundil, Subrina, MD, 1 mg at 05/08/24 0810   insulin  regular, human (MYXREDLIN) 100 units/ 100 mL infusion, , Intravenous, Continuous, Leann Prom, Srobona Tublu, MD, Last Rate: 13 mL/hr at 05/08/24 1014, 13 Units/hr at 05/08/24 1014   lactated ringers  infusion, , Intravenous, Continuous, Sundil, Subrina, MD, Stopped at 05/08/24 1432   lactated ringers  infusion, , Intravenous, Continuous, Chatterjee, Srobona Tublu, MD, Last Rate: 125 mL/hr at 05/08/24 1018, New Bag at 05/08/24 1018   LORazepam (ATIVAN) injection 2 mg, 2 mg, Intravenous, Q4H PRN, Chatterjee, Srobona Tublu, MD, 2 mg at 05/08/24 1203   melatonin tablet 3 mg, 3 mg, Oral, QHS, Sundil, Subrina, MD, 3 mg at 05/08/24 0501   melatonin tablet 3 mg, 3 mg, Oral, QHS, Sundil, Subrina, MD   ondansetron  (ZOFRAN ) tablet 4 mg, 4 mg, Oral, Q6H PRN **OR** ondansetron  (ZOFRAN ) injection 4 mg, 4 mg, Intravenous, Q6H PRN, Sundil, Subrina, MD, 4 mg at 05/08/24 0809   sodium chloride  flush (NS) 0.9 % injection 3 mL, 3 mL, Intravenous, Q12H, Sundil, Subrina, MD, 3 mL at 05/08/24 1017   sodium chloride  flush (NS) 0.9 % injection 3 mL, 3 mL, Intravenous, Q12H, Sundil, Subrina, MD, 3 mL at 05/08/24 1017   sodium chloride  flush (NS) 0.9 % injection 3 mL, 3 mL, Intravenous, PRN, Sundil, Subrina, MD  Current Outpatient Medications:    albuterol  (VENTOLIN  HFA) 108 (90 Base) MCG/ACT inhaler, Inhale 2 puffs into the lungs every 4 (four) hours as needed for wheezing or shortness of breath., Disp: 8 g, Rfl: 6   amitriptyline  (ELAVIL ) 50 MG tablet, Take 50 mg by mouth at bedtime., Disp: , Rfl:    Ascorbic Acid (VITAMIN C) 1000 MG tablet, Take 1,000 mg by mouth daily., Disp: , Rfl:    atenolol  (TENORMIN ) 50 MG tablet, Take 50-100 mg by mouth 2 (two) times daily. Take 2 tablets (100 mg) in the morning & Take 1 tablet (50 mg) at bedtime, Disp: , Rfl:    buPROPion  (WELLBUTRIN  SR) 150 MG 12 hr tablet, Take 150 mg by mouth 2 (two) times daily. , Disp: ,  Rfl:    busPIRone  (BUSPAR ) 10 MG tablet, Take 10 mg by mouth 2 (two) times daily., Disp: , Rfl:    ezetimibe  (ZETIA ) 10 MG tablet, Take 10 mg by mouth daily., Disp: , Rfl:    gabapentin  (NEURONTIN ) 300 MG capsule, Take 300 mg by mouth 2 (two) times daily., Disp: , Rfl:    Insulin  NPH, Human,, Isophane, (NOVOLIN N FLEXPEN) 100 UNIT/ML Kiwkpen, Inject 0-25 Units into the skin 2 (two) times daily as needed., Disp: , Rfl:    insulin  regular (NOVOLIN R) 100 units/mL injection, Inject 0-20 Units into the skin 3 (three) times daily as needed for high blood sugar., Disp: , Rfl:    latanoprost  (  XALATAN ) 0.005 % ophthalmic solution, Place 1 drop into both eyes at bedtime., Disp: , Rfl:    levothyroxine  (SYNTHROID ) 112 MCG tablet, Take 112 mcg by mouth 2 (two) times daily., Disp: , Rfl:    omeprazole  (PRILOSEC) 40 MG capsule, TAKE ONE CAPSULE BY MOUTH EVERY MORNING AND EVERY EVENING, Disp: 60 capsule, Rfl: 11   ondansetron  (ZOFRAN ) 4 MG tablet, Take 4 mg by mouth every 8 (eight) hours as needed for nausea or vomiting., Disp: , Rfl:    rosuvastatin (CRESTOR) 10 MG tablet, Take 10 mg by mouth 3 (three) times a week., Disp: , Rfl:    traMADol (ULTRAM) 50 MG tablet, Take 50 mg by mouth 3 (three) times daily., Disp: , Rfl:    vitamin B-12 (CYANOCOBALAMIN) 1000 MCG tablet, Take 1,000 mcg by mouth daily., Disp: , Rfl:    HYDROcodone -acetaminophen  (NORCO) 10-325 MG tablet, Take 1 tablet by mouth every 4 (four) hours as needed for moderate pain (pain score 4-6)., Disp: 40 tablet, Rfl: 0   tiZANidine  (ZANAFLEX ) 4 MG tablet, Take 1 tablet (4 mg total) by mouth every 8 (eight) hours as needed for muscle spasms., Disp: 90 tablet, Rfl: 1  Vitals   Vitals:   05/08/24 1017 05/08/24 1247 05/08/24 1248 05/08/24 1501  BP: (!) 158/84  119/87   Pulse: (!) 115  (!) 131 (!) 117  Resp: 18  18 18   Temp: 98.3 F (36.8 C) 98 F (36.7 C)  98.1 F (36.7 C)  TempSrc: Oral Oral  Oral  SpO2: 99%  100% 99%  Weight:      Height:         Body mass index is 31.6 kg/m.  Physical Exam   General: Well-developed well-nourished man, in bed, eyes closed very restless HEENT: Normocephalic atraumatic Lungs: Clear Cardiovascular: Regular rate rhythm Neurological examination Laying in bed Extremely restless Eyes closed Opens eyes, able to tell me his name, got his age wrong by 1 year.  Unable to tell me where he is. Speech is mildly dysarthric No evidence of gross aphasia Poor attention concentration Cranial nerves II to XII intact Motor examination: Moving all 4 extremities with pretty good strength-no focality noted Sensation intact to light touch Coordination difficult to assess given his mentation.  His tone is also difficult to assess as he has a lot of paratonia.  Labs/Imaging/Neurodiagnostic studies   CBC:  Recent Labs  Lab 06/05/24 1750 05/08/24 0500  WBC 11.9* 13.9*  NEUTROABS 8.5*  --   HGB 15.8 14.8  HCT 47.8 44.3  MCV 89.8 89.5  PLT 347 305   Basic Metabolic Panel:  Lab Results  Component Value Date   NA 131 (L) 05/08/2024   K 3.8 05/08/2024   CO2 21 (L) 05/08/2024   GLUCOSE 180 (H) 05/08/2024   BUN 13 05/08/2024   CREATININE 0.70 05/08/2024   CALCIUM 8.6 (L) 05/08/2024   GFRNONAA >60 05/08/2024   GFRAA  12/15/2010    >60        The eGFR has been calculated using the MDRD equation. This calculation has not been validated in all clinical situations. eGFR's persistently <60 mL/min signify possible Chronic Kidney Disease.   Lipid Panel: No results found for: "LDLCALC" HgbA1c:  Lab Results  Component Value Date   HGBA1C 7.5 (H) 04/16/2024   Urine Drug Screen:     Component Value Date/Time   LABOPIA NONE DETECTED Jun 05, 2024 2013   COCAINSCRNUR NONE DETECTED 06/05/2024 2013   LABBENZ NONE DETECTED 06/05/2024 2013  AMPHETMU NONE DETECTED 05/07/2024 2013   THCU NONE DETECTED 05/07/2024 2013   LABBARB NONE DETECTED 05/07/2024 2013    Alcohol Level     Component Value  Date/Time   [facility] <15 05/07/2024 1750   INR  Lab Results  Component Value Date   INR 1.0 05/07/2024   APTT  Lab Results  Component Value Date   APTT 30 05/07/2024    CT cervical spine IMPRESSION: 1. Interval ACDF at C5-6 with well seated plate hardware and securing screws. 2. The spinal canal is not optimally seen owing to metal artifact at this level, but there is concern for a left paracentral postoperative disc herniation versus a focal epidural hematoma compressing the left hemicord. MRI recommended. 3. No evidence of cervical spine fractures or traumatic listhesis. 4. Multilevel degenerative changes, with mild-to-moderate central canal stenosis at C3-4 with mild mass effect on the cord, stable since the preoperative MRI. 5. Small amount of patchy ground-glass disease left upper lobe apex, could be due to pneumonia, edema or ground-glass scarring. Follow-up as indicated.  MRI Brain(Personally reviewed): Motion degraded exam.  No acute intracranial abnormality.  Nonspecific lateral ventriculomegaly-otherwise negative for age noncontrast MRI of the brain.  Neurodiagnostics rEEG:  Ordered and pending  ASSESSMENT   Willie [name] is a 63 y.o. male past medical history of possible MCI at baseline, anxiety, depression, diabetes and complications, fibromyalgia, hypertension, hyperlipidemia, sleep apnea, essential tremor presenting for altered mental status after ACDF on 05/01/2024. Since the surgery, after receiving pain medications and muscle relaxants, he has been extremely confused and hallucinating.  He came in with normal sugar levels but did not receive any insulin  and is now likely in DKA which might be contributing to his current AMS. Wife reports symptoms that are suggestive of parkinsonism over the years, raising concern for underlying dementia possibly Lewy body kind. That said, this is not the best place to be making a diagnosis of dementia but he does have some  underlying cognitive neurodegenerative process and this needs to be evaluated outpatient  Impression: Evaluate for neurodegenerative process-delirium due to unmasking of underlying dementia in the setting of stressors on the body by surgery as well as polypharmacy  RECOMMENDATIONS  Routine EEG MRI of the C-spine with and without contrast-to rule out abscess B12, TSH, RPR Avoid sedating medications Try Seroquel if possible to give him p.o. medications-might help with the agitation Will chart check and update recommendations once workup is completed Plan was discussed with Dr. Leann Prom ______________________________________________________________________    Signed, Tona Francis, MD [facility]

## 2024-05-08 NOTE — ED Notes (Signed)
 [facility] called and informed that patient will not be transported to [facility]

## 2024-05-08 NOTE — Progress Notes (Signed)
 PT Cancellation Note  Patient Details Name: Willie [name] MRN: [id number] DOB: 03-25-1961   Cancelled Treatment:    Reason Eval/Treat Not Completed: Patient not medically ready Patient to transfer to [facility], MRI pending.  Abelina Hoes PT Acute Rehabilitation Services Office [phone]    Dareen Ebbing 05/08/2024, 7:31 AM

## 2024-05-08 NOTE — Inpatient Diabetes Management (Addendum)
 Inpatient Diabetes Program Recommendations  AACE/ADA: New Consensus Statement on Inpatient Glycemic Control (2015)  Target Ranges:  Prepandial:   less than 140 mg/dL      Peak postprandial:   less than 180 mg/dL (1-2 hours)      Critically ill patients:  140 - 180 mg/dL   Lab Results  Component Value Date   GLUCAP 329 (H) 05/08/2024   HGBA1C 7.5 (H) 04/16/2024    Review of Glycemic Control  Latest Reference Range & Units 05/08/24 01:31 05/08/24 04:06 05/08/24 05:20 05/08/24 07:54  Glucose-Capillary 70 - 99 mg/dL 409 (H) 811 (H) 914 (H) 329 (H)    Latest Reference Range & Units 05/07/24 17:50 05/08/24 05:00  CO2 22 - 32 mmol/L 26 18 (L)  Glucose 70 - 99 mg/dL 782 (H) 956 (H)  BUN 8 - 23 mg/dL 10 13  Creatinine 2.13 - 1.24 mg/dL 0.86 (L) 5.78  Calcium 8.9 - 10.3 mg/dL 9.3 9.2  Anion gap 5 - 15  11 16  (H)  (L): Data is abnormally low (H): Data is abnormally highDiabetes history: T1DM Outpatient Diabetes medications:  Medtronic 780 with Dexcom G7 Fiasp  Basal-2.95/hr (70.8) ICR-10 Target-90-140 Current orders for Inpatient glycemic control: Novolog  0-6 units Q4H  Inpatient Diabetes Program Recommendations:    Recommend IV insulin . Patient is type 1 DM per previous hospital admission and notes and has not received insulin  since in ED. Secure chat sent to MD. Eldora Greet with Abran Abrahams, patient's wife regarding insulin  needs. Patient is followed by Dr Jesse Moritz for T1DM and following surgery patient has not been appropriate for insulin  pump. Wife has been giving injections based on blood sugars (NPH ~20 units BID and then R with meals). All questions answered regarding DM management.  Thanks, Marjo Sievert, MSN, RNC-OB Diabetes Coordinator [phone] (8a-5p)

## 2024-05-08 NOTE — ED Notes (Signed)
 Pain meds requested for pt due to neck pain. Per Collins Dean, MD" I cannot give him pain meds that is the causing him acute metabolic encephalopathy and confusion."

## 2024-05-08 NOTE — Progress Notes (Signed)
 Patient ID: Willie [name], male   DOB: 05/26/1961, 63 y.o.   MRN: [id number] Patient seen and examined.  We spoke with his wife at length.  He is 1 week status post ACDF with plating.  He fell the night he got home from surgery 1 week ago.  He has been walking around fine since that time but has had difficulty with mentation.  He has not slept..  However, there is some question of dementia and he has an appointment with neurology later this month, but he has been having delirium and hallucinations and agitation.  CT scan of the cervical spine was done.  There may be a small epidural hematoma, but I suspect this is true of most ACDFs 1 week after surgery.  It does not look compressive.  Neurologically, from a motor and sensory exam standpoint, he looks good.  He has good fine motor movement of the hands with good strength in the hands and arms.  He is moving his legs well.  Given this, with the agitation and the confusion and the inability to stay still for further imaging, I think we should cancel the MRI of the cervical spine.  I do not believe it would change our treatment algorithm at this point.  Do not believe he needs cervical reexploration at this point.  He will be admitted to the hospitalist service.  Recommend neurology consult for encephalopathy.  We will follow while he is in the hospital.

## 2024-05-08 NOTE — ED Notes (Signed)
 Patient wife at nurses station c/o patient BS being elevated at 245 per personal monitor device.

## 2024-05-08 NOTE — ED Notes (Signed)
 Seizure pads applied to bed to help stay in bed. He continues to try to throw himself out and continues to pull and tug at equipment and cords, sitter and pt wife at bedside

## 2024-05-08 NOTE — ED Notes (Signed)
 Pt remains restless. Pt trying to get up, pull off mitts, pull of seizure pads, swinging legs over rails. Pt requires physical touch to be redirected. Safety sitter at  bedside.

## 2024-05-08 NOTE — ED Notes (Signed)
 Patient wife called for follow-up

## 2024-05-08 NOTE — ED Notes (Addendum)
 Pt soiled bed sheets. This tech and RN cleaned pt and placed new sheets on bed. Pt in clean brief, repositioned, and warm blanket given. Spouse at bedside

## 2024-05-08 NOTE — Evaluation (Signed)
 SLP Cancellation Note  Patient Details Name: Willie [name] MRN: [id number] DOB: Dec 31, 1960   Cancelled treatment:       Reason Eval/Treat Not Completed: Other (comment) (neurosurgery consult pending as well as MRI, will continue efforts for swallow evaluation as ordered)  Pt to transfer to [facility] per chart review.   Maudie Sorrow, MS [facility] SLP Acute Rehab Services Office [phone]   Chantal Comment 05/08/2024, 7:53 AM

## 2024-05-08 NOTE — ED Notes (Signed)
 Ativan ordered for MRI day shift RN aware

## 2024-05-08 NOTE — ED Notes (Signed)
 Patient not compliant with MRI. Meds ordered for patient but he is still refusing to have scan completed. MRI will bring patient back and re attempt scan at a later time today.

## 2024-05-08 NOTE — ED Notes (Signed)
 Pt ambulatory w/ partner at standby

## 2024-05-08 NOTE — H&P (Addendum)
 History and Physical    Willie [name] GMW:[id number] DOB: 01-27-1961 DOA: 05/07/2024  PCP: Rosslyn Coons, MD   Patient coming from: Home   Chief Complaint:  Chief Complaint  Patient presents with   Memory Loss   ED TRIAGE note:  Pt normally has some short term memory issues. Pt had surgery on his vertebrae last week, since surgery his memory has been worse. Alert and orientedx4 on arrival.     HPI:  Willie [name] is a 63 y.o. male with medical history significant of mild cognitive impairment, generalized anxiety disorder, depression, chronic pain syndrome, history of gait instability, reactive airway disease, insulin -dependent DM type II, hypothyroidism, hyperlipidemia, and cervical stenosis underwent a spine fusion 05/01/2024 presented emergency department complaining of more confusion. Patient was prescribed muscle relaxant as well as pain medication oxycodone.  Family states since surgery patient has trouble with increasing confusion and hallucination.At night he is woken up thinking people were in the house.  It seems to get worse at night and he has been getting more confused.  Reported patient has been taking few pain medication combining with tizanidine  and gabapentin .  Wife at the bedside reported the patient has history of chronic memory loss waiting for neurology formal evaluation for Lewy body dementia.  However post surgery memory has been worsening also patient is having more unsteady gait as compared to his baseline.  Reported history of recurrent fall.  Also worsening choking of food. Patient is poor historian and history gathered with the help of the wife.  Per patient's wife patient has some cough and choking however no fever, chest pain, palpitation, headache, chill, known sick contact, nausea, vomiting and diarrhea.   At presentation to ED patient is hemodynamically stable CBC  CMP unremarkable. Blood alcohol level and UDS unremarkable. Normal pro time  INR. Pending UA.   CT head no acute abnormality.  Atrophy and chronic small vessel ischemic changes.  No acute intracranial process.  CT cervical spine: 1. Interval ACDF at C5-6 with well seated plate hardware and securing screws. 2. The spinal canal is not optimally seen owing to metal artifact at this level, but there is concern for a left paracentral postoperative disc herniation versus a focal epidural hematoma compressing the left hemicord. MRI recommended. 3. No evidence of cervical spine fractures or traumatic listhesis. 4. Multilevel degenerative changes, with mild-to-moderate central canal stenosis at C3-4 with mild mass effect on the cord, stable since the preoperative MRI. 5. Small amount of patchy ground-glass disease left upper lobe apex, could be due to pneumonia, edema or ground-glass scarring. Follow-up as indicated.  Family reported that patient has history of falling backward however it has been progressively getting worse.  Also he has more agitation and confusion as compared to his baseline.  ED physician discussed CT cervical spine finding focal epidural hematoma/disc herniation.  Neurosurgery recommended obtain MRI of the brain to rule out a stroke and obtain MRI of the cervical spine for better clarification.  Recommended to admit patient to [facility] and neurosurgery will evaluate upon arrival.  Per Dr. Garnet Just patient does not have any symptom of fever, chill cough.  There is no concern for pneumonia.  Hospitalist has been consulted for further evaluation management of acute on chronic memory loss/worsening agitation and confusion, disc herniation versus focal epidural hematoma and need to follow-up with MRI to rule out a stroke.   Significant labs in the ED: Lab Orders         Protime-INR  APTT         CBC         Differential         Comprehensive metabolic panel         Ethanol         Urine rapid drug screen (hosp performed)          Urinalysis, Routine w reflex microscopic -Urine, Clean Catch         HIV Antibody (routine testing w rflx)         Comprehensive metabolic panel         CBC         Ammonia         TSH         RPR         CBG monitoring, ED       Review of Systems:  Review of Systems  Constitutional:  Negative for chills, fever, malaise/fatigue and weight loss.  Respiratory:  Positive for cough.   Cardiovascular:  Negative for chest pain and palpitations.  Gastrointestinal:  Negative for abdominal pain, diarrhea, heartburn, nausea and vomiting.       Choking sensation  Musculoskeletal:  Positive for falls and neck pain. Negative for joint pain and myalgias.  Neurological:  Negative for dizziness, tingling, tremors, sensory change, speech change, focal weakness, seizures, loss of consciousness, weakness and headaches.  Psychiatric/Behavioral:  Positive for hallucinations and memory loss. Negative for depression and substance abuse. The patient is nervous/anxious. The patient does not have insomnia.     Past Medical History:  Diagnosis Date   Anxiety    Arthritis    BPH (benign prostatic hyperplasia)    Depression    Diabetes (HCC)    Diabetic neuropathy (HCC)    Diabetic retinopathy (HCC)    Esophageal stricture    Fibromyalgia    History of kidney stones    Hyperlipidemia    Hypertension    Hypothyroidism    Melanoma (HCC)    OSA (obstructive sleep apnea)    Polyneuropathy    Tremor     Past Surgical History:  Procedure Laterality Date   ANTERIOR CERVICAL DECOMP/DISCECTOMY FUSION N/A 05/01/2024   Procedure: ANTERIOR CERVICAL DECOMPRESSION/DISCECTOMY FUSION  Cervical Five-Cervical Six, remove tether plate Cervical Six-Seven;  Surgeon: Joaquin Mulberry, MD;  Location: [facility] OR;  Service: Neurosurgery;  Laterality: N/A;  ACDF - C3-C4 - C4-C5 - C5-C6, remove tether plate G9-5   CARPAL TUNNEL RELEASE Bilateral 2002   CATARACT EXTRACTION     CERVICAL DISC SURGERY  2005   CYST EXCISION  2002    middle finger   CYST EXCISION  2007   left thigh   ESOPHAGEAL DILATION     ROTATOR CUFF REPAIR Left    TRIGGER FINGER RELEASE Bilateral      reports that he has never smoked. He quit smokeless tobacco use about 20 years ago. He reports that he does not currently use alcohol. He reports that he does not use drugs.  Allergies  Allergen Reactions   Other Other (See Comments), Shortness Of Breath and Swelling    Flu vaccine   Latex Rash    Adhesive     Family History  Problem Relation Age of Onset   Tremor Mother    Thyroid disease Mother    CAD Father    COPD Father    Other Brother        unknown   Healthy Son     Prior to  Admission medications   Medication Sig Start Date End Date Taking? Authorizing Provider  albuterol  (VENTOLIN  HFA) 108 (90 Base) MCG/ACT inhaler Inhale 2 puffs into the lungs every 4 (four) hours as needed for wheezing or shortness of breath. 08/21/20   Byrum, Delora Ferry, MD  amitriptyline  (ELAVIL ) 50 MG tablet Take 50 mg by mouth daily. 01/02/17   [provider]  Ascorbic Acid (VITAMIN C) 1000 MG tablet Take 1,000 mg by mouth daily.    [provider]  atenolol  (TENORMIN ) 50 MG tablet Take 150 mg by mouth at bedtime. 12/13/16   [provider]  buPROPion  (WELLBUTRIN  SR) 150 MG 12 hr tablet Take 150 mg by mouth 2 (two) times daily.  12/16/16   [provider]  busPIRone  (BUSPAR ) 10 MG tablet Take 10 mg by mouth 2 (two) times daily. 11/14/16   [provider]  clobetasol  (TEMOVATE ) 0.05 % external solution Apply 1 Application topically daily. 02/28/24   [provider]  ezetimibe  (ZETIA ) 10 MG tablet Take 10 mg by mouth daily. 02/26/24   [provider]  gabapentin  (NEURONTIN ) 300 MG capsule Take 300 mg by mouth 2 (two) times daily.    [provider]  HYDROcodone -acetaminophen  (NORCO) 10-325 MG tablet Take 1 tablet by mouth every 4 (four) hours as needed for moderate pain (pain score 4-6).  05/01/24   Joaquin Mulberry, MD  Insulin  NPH, Human,, Isophane, (NOVOLIN N FLEXPEN) 100 UNIT/ML Kiwkpen Inject into the skin as needed.    [provider]  insulin  regular (NOVOLIN R) 100 units/mL injection Inject into the skin 3 (three) times daily as needed for high blood sugar.    [provider]  latanoprost  (XALATAN ) 0.005 % ophthalmic solution Place 1 drop into both eyes at bedtime.    [provider]  levothyroxine  (SYNTHROID ) 112 MCG tablet Take 200 mcg by mouth 2 (two) times daily. 12/23/16   [provider]  NOVOLOG  100 UNIT/ML injection continuous. Insulin  pump    [provider]  omeprazole  (PRILOSEC) 40 MG capsule TAKE ONE CAPSULE BY MOUTH EVERY MORNING AND EVERY EVENING 10/04/21   Denson Flake, MD  rosuvastatin (CRESTOR) 10 MG tablet Take 10 mg by mouth 3 (three) times a week. 10/12/16   [provider]  tiZANidine  (ZANAFLEX ) 4 MG tablet Take 1 tablet (4 mg total) by mouth every 8 (eight) hours as needed for muscle spasms. 05/01/24   Joaquin Mulberry, MD  traMADol (ULTRAM) 50 MG tablet Take 50 mg by mouth 3 (three) times daily. 10/17/16   [provider]  vitamin B-12 (CYANOCOBALAMIN) 1000 MCG tablet Take 1,000 mcg by mouth daily.    [provider]     Physical Exam: Vitals:   05/07/24 2216 05/07/24 2315 05/08/24 0015 05/08/24 0129  BP: 129/87 (!) 122/94 (!) 150/90 (!) 148/84  Pulse: 84 81 85 98  Resp: 18   18  Temp: 98 F (36.7 C)   98 F (36.7 C)  TempSrc: Oral   Oral  SpO2: 97% 100% 98% 100%  Weight:      Height:        Physical Exam Vitals and nursing note reviewed.  Constitutional:      General: He is not in acute distress.    Appearance: He is ill-appearing. He is not toxic-appearing.  HENT:     Mouth/Throat:     Mouth: Mucous membranes are dry.  Eyes:     Pupils: Pupils are equal, round, and reactive to light.  Cardiovascular:     Rate and Rhythm: Normal rate and regular rhythm.      Pulses: Normal pulses.     Heart sounds: Normal heart sounds.  Pulmonary:     Effort: Pulmonary effort is normal.     Breath sounds: Normal breath sounds.  Musculoskeletal:     Right lower leg: No edema.     Left lower leg: No edema.  Skin:    General: Skin is dry.     Capillary Refill: Capillary refill takes less than 2 seconds.  Neurological:     Mental Status: He is alert.     Cranial Nerves: No cranial nerve deficit.     Sensory: No sensory deficit.     Comments: Alert and oriented to self and wife  Psychiatric:     Comments: Unable to assess      Labs on Admission: I have personally reviewed following labs and imaging studies  CBC: Recent Labs  Lab 05/07/24 1750  WBC 11.9*  NEUTROABS 8.5*  HGB 15.8  HCT 47.8  MCV 89.8  PLT 347   Basic Metabolic Panel: Recent Labs  Lab 05/07/24 1750  NA 136  K 4.0  CL 99  CO2 26  GLUCOSE 144*  BUN 10  CREATININE 0.49*  CALCIUM 9.3   GFR: Estimated Creatinine Clearance: 111.5 mL/min (A) (by C-G formula based on SCr of 0.49 mg/dL (L)). Liver Function Tests: Recent Labs  Lab 05/07/24 1750  AST 30  ALT 38  ALKPHOS 82  BILITOT 0.7  PROT 7.9  ALBUMIN  4.1   No results for input(s): "LIPASE", "AMYLASE" in the last 168 hours. No results for input(s): "AMMONIA" in the last 168 hours. Coagulation Profile: Recent Labs  Lab 05/07/24 1750  INR 1.0   Cardiac Enzymes: No results for input(s): "CKTOTAL", "CKMB", "CKMBINDEX", "TROPONINI", "TROPONINIHS" in the last 168 hours. BNP (last 3 results) No results for input(s): "BNP" in the last 8760 hours. HbA1C: No results for input(s): "HGBA1C" in the last 72 hours. CBG: Recent Labs  Lab 05/01/24 0641 05/01/24 1125 05/01/24 1647 05/08/24 0131  GLUCAP 190* 243* 257* 208*   Lipid Profile: No results for input(s): "CHOL", "HDL", "LDLCALC", "TRIG", "CHOLHDL", "LDLDIRECT" in the last 72 hours. Thyroid Function Tests: No results for input(s): "TSH", "T4TOTAL", "FREET4",  "T3FREE", "THYROIDAB" in the last 72 hours. Anemia Panel: No results for input(s): "VITAMINB12", "FOLATE", "FERRITIN", "TIBC", "IRON", "RETICCTPCT" in the last 72 hours. Urine analysis:    Component Value Date/Time   COLORURINE YELLOW 05/07/2024 2013   APPEARANCEUR CLEAR 05/07/2024 2013   LABSPEC 1.011 05/07/2024 2013   PHURINE 5.0 05/07/2024 2013   GLUCOSEU 150 (A) 05/07/2024 2013   HGBUR NEGATIVE 05/07/2024 2013   BILIRUBINUR NEGATIVE 05/07/2024 2013   KETONESUR 5 (A) 05/07/2024 2013   PROTEINUR NEGATIVE 05/07/2024 2013   NITRITE NEGATIVE 05/07/2024 2013   LEUKOCYTESUR NEGATIVE 05/07/2024 2013    Radiological Exams on Admission: I have personally reviewed images CT Cervical Spine Wo Contrast Result Date: 05/08/2024 CLINICAL DATA:  C5-6 ACDF fusion surgery was done 6 days ago. Increasing pain and memory issues. ER also indicates blunt polytrauma. EXAM: CT CERVICAL SPINE WITHOUT CONTRAST TECHNIQUE: Multidetector CT imaging of the cervical spine was performed without intravenous contrast. Multiplanar CT image reconstructions were also generated. RADIATION DOSE REDUCTION: This exam was performed according to the departmental dose-optimization program which includes automated exposure control, adjustment of the mA and/or kV according to patient size and/or use of iterative reconstruction technique. COMPARISON:  Preoperative MRI 03/25/2024.  FINDINGS: Alignment: Normal with the exception of bone-on-bone anterior atlantodental joint space loss with prominent osteophytes, and dorsal pannus at C1-2. No traumatic listhesis. Skull base and vertebrae: No fracture or primary pathologic process is seen. The lower half of the pre-existing C6-7 ACDF plate hardware remains in place, the upper half has been removed with interval insertion of well seated C5-6 ACDF plate hardware and securing screws. There is an interbody bone plug or other surgical material. There is solid fusion including the facet joints at  C6-7. Bulky anterior bridging enthesopathy C3-5 again noted, anterior nonbridging osteophytes C2-3, anterior bridging enthesopathy again C7-T1 T1-2. Soft tissues and spinal canal: No prevertebral fluid or swelling. No visible canal hematoma. Disc levels: There is preservation of the normal disc heights C2-3 through C4-5, interval ACDF and interbody bone plug or surgical material C5-6 without evidence of hardware loosening, mature chronic fusion of C6-7 and disc space loss C7-T1 and T1-2 chronically. At C2-3 there is a a mild disc bulge indenting the ventral thecal sac, without mass effect At C3-4, posterior disc bulge and dorsal endplate ridging both contribute to mild-to-moderate central canal stenosis with mild mass effect on the cord flattening its ventral surface. There is uncinate spurring with mild bilateral foraminal narrowing. At C4-5, there is a mild nonstenosing posterior disc osteophyte complex without herniation. Mild uncinate ridging with slight foraminal stenosis. C5-6, plate hardware is again seen from recent surgery. The spinal canal is not optimally seen owing to metal artifact, but there is concern for a left paracentral postoperative disc herniation versus a focal epidural hematoma compressing the left hemicord best seen on 9: 61 and 62 and on sagittal reconstruction image 7:43. Mild uncinate ridging and bilateral mild foraminal stenosis. At C6-7, there is mature fusion. Spinal canal and foramina are patent. At C7-T1 there are degenerative changes of the foraminal or spinal canal mass effect. Upper chest: Small amount patchy ground-glass disease left upper lobe apex, could be due to pneumonia, edema or ground-glass scarring. This was not present on the chest CT dated 07/09/2020. Other: None. IMPRESSION: 1. Interval ACDF at C5-6 with well seated plate hardware and securing screws. 2. The spinal canal is not optimally seen owing to metal artifact at this level, but there is concern for a left  paracentral postoperative disc herniation versus a focal epidural hematoma compressing the left hemicord. MRI recommended. 3. No evidence of cervical spine fractures or traumatic listhesis. 4. Multilevel degenerative changes, with mild-to-moderate central canal stenosis at C3-4 with mild mass effect on the cord, stable since the preoperative MRI. 5. Small amount of patchy ground-glass disease left upper lobe apex, could be due to pneumonia, edema or ground-glass scarring. Follow-up as indicated. Electronically Signed   By: Denman Fischer M.D.   On: 05/08/2024 00:27   CT Head Wo Contrast Result Date: 05/07/2024 CLINICAL DATA:  Mental status change, unknown cause EXAM: CT HEAD WITHOUT CONTRAST TECHNIQUE: Contiguous axial images were obtained from the base of the skull through the vertex without intravenous contrast. RADIATION DOSE REDUCTION: This exam was performed according to the departmental dose-optimization program which includes automated exposure control, adjustment of the mA and/or kV according to patient size and/or use of iterative reconstruction technique. COMPARISON:  None Available. FINDINGS: Brain: There is periventricular white matter decreased attenuation consistent with small vessel ischemic changes. Ventricles, sulci and cisterns are prominent consistent with age related involutional changes. No acute intracranial hemorrhage, mass effect or shift. No hydrocephalus. Vascular: No hyperdense vessel or unexpected calcification. Skull: Normal. Negative  for fracture or focal lesion. Sinuses/Orbits: No acute finding. IMPRESSION: Atrophy and chronic small vessel ischemic changes. No acute intracranial process identified. Electronically Signed   By: Sydell Eva M.D.   On: 05/07/2024 18:53     EKG: My personal interpretation of EKG shows: Normal sinus rhythm heart rate 91, right axis deviation.  There is no history of abnormality.    Assessment/Plan: Principal Problem:   Acute metabolic  encephalopathy Active Problems:   Worsening short-term memory loss   Epidural hematoma (HCC)   Cervical spinal stenosis s/p fusion 5/7   Hyperlipidemia   Generalized anxiety disorder   Chronic depression   Chronic pain syndrome   History of gait instability   Hypothyroidism   Mild cognitive impairment   Insulin  dependent type 2 diabetes mellitus (HCC)   History of choking    Assessment and Plan: Acute metabolic encephalopathy-due to multiple pain medication/polypharmacy vs CVA vs epidural hematoma vs postop delirium History of short-term memory loss-worsening now History of unsteady gait and falling backward Cervical stenosis status post fusion 05/01/2024 -Patient has been brought to ED for evaluation for acute on chronic memory loss, worsening agitation and more unsteady gait and falling backward. - Patient has recent cervical fusion surgery 5/7 at home on multiple pain medication including gabapentin , Zanaflex  and Norco. - Family reported patient worsening short-term memory loss, confusion, worsening unsteady gait and choking  of food.  Wife  reported waiting for formal neurology evaluation for Lewy body dementia. - At presentation to ED patient is hemodynamically stable - CBC showing leukocytosis 11.9 otherwise unremarkable.  CMP unremarkable.  Blood alcohol level and UDS negative. - CT head showed postoperative disc herniation versus focal epidural hematoma compressing the left hemicord.  MRI recommended. - Dr. Monique Ano discussed case with neurosurgery PA who recommended MRI of the brain and MRI of the cervical spine for better clarification.  Recommended to rule out a stroke.  No need for emergency transfer ED to ED to rule out a stroke however recommended to admit to [facility] to get the MRI done and neurosurgery will do formal evaluation. - Concern for acute metabolic encephalopathy/memory impairment due to polypharmacy versus CVA versus epidural hematoma versus postoperative  delirium. - Checking ammonia, TSH and RPR. - Will hold off further pain medication, muscle relaxer and gabapentin . -Need to follow-up with MRI of the brain and cervical spine. -Continue delirium precaution. -Will avoid further narcotics/Zanaflex  and gabapentin  in the setting of acute metabolic encephalopathy - Ordering Tylenol  1000 mg every 8 hour as needed for management of moderate to severe pain. -Once speech evaluates and cleared can start oral Tylenol  as needed. -Admitting patient to progressive unit at [facility].   Left fracture edema/groundglass is getting/pneumonia -Patient does not have any fever, cough.  However have mild leukocytosis. - CT cervical spine showed  small amount of patchy ground-glass disease left upper lobe apex, could be due to pneumonia, edema or ground-glass scarring. Follow-up recommended -Obtaining chest x-ray for better picture.  Minimizing further radiation exposure.  Short-term memory loss-worsening Unsteady gait - Patient's wife at the bedside reported that patient has history of short-term memory loss and unsteady gait waiting for neurology evaluation outpatient.  Patient has hallucination and memory loss and concern for Lewy body dementia but have not had any formal neurology evaluation yet. -Consulted inpatient PT and OT for evaluation of balance. -Continue fall precaution. -Requesting for bedside TeleSitter for safety observation.  History of choking - Wife at the bedside reported that patient has  history of choking however postsurgery it has been getting worse. -Consult inpatient speech for swallow evaluation. -Keep patient NPO. - Starting maintenance fluid at 50 cc/h to keep hydrated.   Chronic pain syndrome -Holding oxycodone and Zanaflex .  Holding gabapentin .  Insulin -dependent DM type II - Wife reported that patient used to be on insulin  pump but currently on short-acting insulin  20 units twice daily ? -Pending pharmacy  verification of home medication. -A1c 7.5 04/16/2024 - Continue check POC blood glucose every 6 hours with sliding scale insulin  coverage.  Hypothyroidism Hyperlipidemia Generalized anxiety disorder Chronic depression -Awaiting for pharmacy verification of home medications  Reactive airway disease -Continue albuterol  as needed  Please resume home medications for generalized anxiety disorder, depression, insulin  and levothyroxine  after pharmacy reconciliation.  DVT prophylaxis:  SCDs Code Status:  Full Code Diet: Heart healthy carb modified Family Communication:   Family was present at bedside, at the time of interview. Opportunity was given to ask question and all questions were answered satisfactorily.  Disposition Plan: Pending MRI brain & MRI cervical spine. Consults: Neurosurgery Admission status:   Inpatient, progressive unit to [facility]  Severity of Illness: The appropriate patient status for this patient is INPATIENT. Inpatient status is judged to be reasonable and necessary in order to provide the required intensity of service to ensure the patient's safety. The patient's presenting symptoms, physical exam findings, and initial radiographic and laboratory data in the context of their chronic comorbidities is felt to place them at high risk for further clinical deterioration. Furthermore, it is not anticipated that the patient will be medically stable for discharge from the hospital within 2 midnights of admission.   * I certify that at the point of admission it is my clinical judgment that the patient will require inpatient hospital care spanning beyond 2 midnights from the point of admission due to high intensity of service, high risk for further deterioration and high frequency of surveillance required.Aaron Aas    Derell Bruun, MD [facility]  How to contact the [facility] Attending or Consulting provider 7A - 7P or covering provider during after hours 7P -7A, for this  patient.  Check the care team in [facility] and look for a) attending/consulting [facility] provider listed and b) the [facility] team listed Log into [email or web address] and use [place]'s universal password to access. If you do not have the password, please contact the hospital operator. Locate the [facility] provider you are looking for under [facility] and page to a number that you can be directly reached. If you still have difficulty reaching the provider, please page the [facility] (Director on Call) for the Hospitalists listed on amion for assistance.  05/08/2024, 3:52 AM

## 2024-05-08 NOTE — ED Notes (Signed)
 Patient CBG 208

## 2024-05-08 NOTE — Progress Notes (Signed)
 OT Cancellation Note  Patient Details Name: Willie [name] MRN: [id number] DOB: [birth year]   Cancelled Treatment:    Reason Eval/Treat Not Completed: Medical issues which prohibited therapy Patient is pending transfer to [facility] progressive floor with pending MRI and increased pain with limited pain medications at this time. OT to continue to follow and check back after transfer as schedule will allow.  Wynette Heckler, MS Acute Rehabilitation Department Office# [phone]  05/08/2024, 7:15 AM

## 2024-05-08 NOTE — Progress Notes (Signed)
 PROGRESS NOTE    Willie [name]  WUX:[id number]  DOB: 24-Jul-1961  DOA: 05/07/2024 PCP: Willie Coons, MD Outpatient Specialists:   Hospital course:  63 year old male with mild cognitive impairment, GAD chronic pain syndrome DM2 underwent ACDF 05/01/2024 for cervical stenosis and was brought in by his wife for worsening confusion.  Imaging was concerning for possible epidural hematoma with recommendation for MRI and neurosurgery consultation.  Dr. Rochelle [name] came by and saw the patient and does not feel need for MRI but recommended neurology consult for worsening confusion.  Wife states that patient had confusion until his ACDF and then had sudden acute worsening with subsequent agitation, delirium initially starting as sundowning but then progressing over the past 2 weeks to be the whole day.  The last 2 to 3 days patient has been violent and threatening to his wife which is grossly unusual for him.  Patient's wife thinks that he has Lewy body dementia because "he checks all the boxes on [employer]"--of note he has been seen for tremor, has history of recurrent falls.  Subjective:  Patient unable to provide history.  He is agitated and trying to get out of bed, sitter at bedside trying to calm him.   Objective: Vitals:   05/08/24 0644 05/08/24 1017 05/08/24 1247 05/08/24 1248  BP: (!) 162/86 (!) 158/84  119/87  Pulse: (!) 106 (!) 115  (!) 131  Resp: 18 18  18   Temp: 97.8 F (36.6 C) 98.3 F (36.8 C) 98 F (36.7 C)   TempSrc: Oral Oral Oral   SpO2: 98% 99%  100%  Weight:      Height:        Intake/Output Summary (Last 24 hours) at 05/08/2024 1410 Last data filed at 05/08/2024 1150 Gross per 24 hour  Intake 295.79 ml  Output --  Net 295.79 ml   Filed Weights   05/07/24 1602  Weight: 97.1 kg     Exam:  General: Confused agitated patient moving around restlessly in bed trying to get out. Eyes: sclera anicteric, conjuctiva mild injection bilaterally CVS: S1-S2, regular   Respiratory:  decreased air entry bilaterally secondary to decreased inspiratory effort, rales at bases  GI: NABS, soft, NT  LE: No ecchymoses noted Neuro: Acutely delirious and agitated  Data Reviewed:  Basic Metabolic Panel: Recent Labs  Lab 05/07/24 1750 05/08/24 0500 05/08/24 0928 05/08/24 1203  NA 136 131* 128* 130*  K 4.0 4.5 5.0 6.2*  CL 99 97* 94* 99  CO2 26 18* 16* 15*  GLUCOSE 144* 334* 369* 288*  BUN 10 13 16 15   CREATININE 0.49* 0.87 0.91 1.03  CALCIUM 9.3 9.2 8.9 8.9    CBC: Recent Labs  Lab 05/07/24 1750 05/08/24 0500  WBC 11.9* 13.9*  NEUTROABS 8.5*  --   HGB 15.8 14.8  HCT 47.8 44.3  MCV 89.8 89.5  PLT 347 305     Scheduled Meds:  melatonin  3 mg Oral QHS   melatonin  3 mg Oral QHS   sodium chloride  flush  3 mL Intravenous Q12H   sodium chloride  flush  3 mL Intravenous Q12H   Continuous Infusions:  sodium chloride      acetaminophen  Stopped (05/08/24 0838)   dextrose  5% lactated ringers      insulin  13 Units/hr (05/08/24 1014)   lactated ringers  75 mL/hr at 05/08/24 0505   lactated ringers  125 mL/hr at 05/08/24 1018     Assessment & Plan:   DKA DM1 previously on insulin  pump Labs this morning  show drop in bicarb and increased anion gap with modestly increased glucose.  Of note patient did not get any insulin  overnight in the ED. Patient started on insulin  drip to follow Glucomander protocol.  Altered mental status apparently precipitated by recent ACDF Mild cognitive impairment Gait instability and h/o falling backwards Discussed with Dr. Bonnita [name], baseline dementia can certainly be worsened significantly by the stresses of surgery.  Also his history of tremor, gait instability is suggestive of possible Lewy body disease which patient's wife thinks he has because he checks all the boxes".  With severe agitation, patient's wife states this has become his new normal over the past couple of days.  DKA, do not want to start an antipsychotic  given opacity to increase glucose and can potentially precipitate DKA.  Will start patient on Ativan which is not my favorite drug to start on a patient with dementia however it is the best thing I have right now. Patient to be transferred to [facility] for possible 24-hour EEG and further MRI imaging as warranted.  Possible epidural abscess Recent ACDF By Dr. Rochelle [name] this morning who notes that a small epidural abscess is not uncommon after recent ACDF.  He is does not believe further MRI is warranted to work this up, he discontinued MRI.  He will follow-up patient while in house.  Groundglass and LUL on CT Reactive airway disease CT cervical spine showed small amount of patchy groundglass disease in LUL Patient is satting at 100% on RA, is afebrile and has a small leukocytosis which is nonspecific especially given acute stresses and DKA. Chest x-ray done is negative. Would obtain a follow-up chest CT when patient is stable and able to tolerate Continue inhaled bronchodilators as needed  Concern for choking As wife notes that he has been choking more since his surgery Will need speech evaluation once he is able to follow directions and take p.o.  Chronic pain syndrome Zanaflex , gabapentin  and oxycodone are being held given acute delirium  Hypothyroidism TSH ordered for tomorrow Synthroid  can be restarted when patient able to take p.o.  Anxiety and depression Would restart meds when patient able to take p.o.   DVT prophylaxis: SCD Code Status: Full code Family Communication: At length with patient's wife Willie [name]     Studies: MR BRAIN WO CONTRAST Result Date: 05/08/2024 CLINICAL DATA:  63 year old male recently status post spine surgery. Neurologic deficit, short-term memory issues. EXAM: MRI HEAD WITHOUT CONTRAST TECHNIQUE: Multiplanar, multiecho pulse sequences of the brain and surrounding structures were obtained without intravenous contrast. COMPARISON:  Head and cervical  spine CT yesterday. FINDINGS: Study is intermittently degraded by motion artifact despite repeated imaging attempts, especially coronal T2 imaging. Brain: No restricted diffusion or evidence of acute infarction. Nonspecific ventriculomegaly as demonstrated on the CT, with lateral ventricles most affected although fairly diminutive temporal horns. Fourth ventricle size normal. No transependymal edema. No superimposed midline shift, mass effect, evidence of mass lesion, extra-axial collection or acute intracranial hemorrhage. Cervicomedullary junction and pituitary are within normal limits. Centrally normal for age gray and white matter signal throughout the brain. No cortical encephalomalacia or chronic cerebral blood products identified. Deep gray nuclei, brainstem and cerebellum appear negative. Grossly negative mesial temporal lobe structures. Vascular: Major intracranial vascular flow voids are preserved. Skull and upper cervical spine: Negative for age visible cervical spine. Visualized bone marrow signal is within normal limits. Sinuses/Orbits: Postoperative changes to both globes, otherwise negative. Other: Paranasal Visualized paranasal sinuses and mastoids are clear. IMPRESSION: 1. Mildly  motion degraded exam.  No acute intracranial abnormality. 2. Nonspecific lateral ventriculomegaly. Otherwise negative for age noncontrast MRI appearance of the Brain. Electronically Signed   By: Marlise Simpers M.D.   On: 05/08/2024 06:49   DG CHEST PORT 1 VIEW Result Date: 05/08/2024 CLINICAL DATA:  63 year old male with recent cervical spine surgery. Ground-glass opacity in the left lung apex on cervical spine CT yesterday. EXAM: PORTABLE CHEST 1 VIEW COMPARISON:  Cervical spine CT 05/07/2024, chest radiographs 12/18/2010. FINDINGS: Portable AP semi upright view at 0425 hours. Cervical ACDF hardware is visible, some was present in 2011. Lung volumes are within normal limits. Improved right perihilar and right lung base  ventilation compared to 2011, and when allowing for portable technique the lungs are clear. No pneumothorax or pleural effusion. Mediastinal contours are within normal limits. No acute osseous abnormality identified. Paucity of bowel gas the visible abdomen. IMPRESSION: Negative portable chest. Electronically Signed   By: Marlise Simpers M.D.   On: 05/08/2024 04:56   CT Cervical Spine Wo Contrast Result Date: 05/08/2024 CLINICAL DATA:  C5-6 ACDF fusion surgery was done 6 days ago. Increasing pain and memory issues. ER also indicates blunt polytrauma. EXAM: CT CERVICAL SPINE WITHOUT CONTRAST TECHNIQUE: Multidetector CT imaging of the cervical spine was performed without intravenous contrast. Multiplanar CT image reconstructions were also generated. RADIATION DOSE REDUCTION: This exam was performed according to the departmental dose-optimization program which includes automated exposure control, adjustment of the mA and/or kV according to patient size and/or use of iterative reconstruction technique. COMPARISON:  Preoperative MRI 03/25/2024. FINDINGS: Alignment: Normal with the exception of bone-on-bone anterior atlantodental joint space loss with prominent osteophytes, and dorsal pannus at C1-2. No traumatic listhesis. Skull base and vertebrae: No fracture or primary pathologic process is seen. The lower half of the pre-existing C6-7 ACDF plate hardware remains in place, the upper half has been removed with interval insertion of well seated C5-6 ACDF plate hardware and securing screws. There is an interbody bone plug or other surgical material. There is solid fusion including the facet joints at C6-7. Bulky anterior bridging enthesopathy C3-5 again noted, anterior nonbridging osteophytes C2-3, anterior bridging enthesopathy again C7-T1 T1-2. Soft tissues and spinal canal: No prevertebral fluid or swelling. No visible canal hematoma. Disc levels: There is preservation of the normal disc heights C2-3 through C4-5, interval  ACDF and interbody bone plug or surgical material C5-6 without evidence of hardware loosening, mature chronic fusion of C6-7 and disc space loss C7-T1 and T1-2 chronically. At C2-3 there is a a mild disc bulge indenting the ventral thecal sac, without mass effect At C3-4, posterior disc bulge and dorsal endplate ridging both contribute to mild-to-moderate central canal stenosis with mild mass effect on the cord flattening its ventral surface. There is uncinate spurring with mild bilateral foraminal narrowing. At C4-5, there is a mild nonstenosing posterior disc osteophyte complex without herniation. Mild uncinate ridging with slight foraminal stenosis. C5-6, plate hardware is again seen from recent surgery. The spinal canal is not optimally seen owing to metal artifact, but there is concern for a left paracentral postoperative disc herniation versus a focal epidural hematoma compressing the left hemicord best seen on 9: 61 and 62 and on sagittal reconstruction image 7:43. Mild uncinate ridging and bilateral mild foraminal stenosis. At C6-7, there is mature fusion. Spinal canal and foramina are patent. At C7-T1 there are degenerative changes of the foraminal or spinal canal mass effect. Upper chest: Small amount patchy ground-glass disease left upper lobe apex, could  be due to pneumonia, edema or ground-glass scarring. This was not present on the chest CT dated 07/09/2020. Other: None. IMPRESSION: 1. Interval ACDF at C5-6 with well seated plate hardware and securing screws. 2. The spinal canal is not optimally seen owing to metal artifact at this level, but there is concern for a left paracentral postoperative disc herniation versus a focal epidural hematoma compressing the left hemicord. MRI recommended. 3. No evidence of cervical spine fractures or traumatic listhesis. 4. Multilevel degenerative changes, with mild-to-moderate central canal stenosis at C3-4 with mild mass effect on the cord, stable since the  preoperative MRI. 5. Small amount of patchy ground-glass disease left upper lobe apex, could be due to pneumonia, edema or ground-glass scarring. Follow-up as indicated. Electronically Signed   By: Denman Fischer M.D.   On: 05/08/2024 00:27   CT Head Wo Contrast Result Date: 05/07/2024 CLINICAL DATA:  Mental status change, unknown cause EXAM: CT HEAD WITHOUT CONTRAST TECHNIQUE: Contiguous axial images were obtained from the base of the skull through the vertex without intravenous contrast. RADIATION DOSE REDUCTION: This exam was performed according to the departmental dose-optimization program which includes automated exposure control, adjustment of the mA and/or kV according to patient size and/or use of iterative reconstruction technique. COMPARISON:  None Available. FINDINGS: Brain: There is periventricular white matter decreased attenuation consistent with small vessel ischemic changes. Ventricles, sulci and cisterns are prominent consistent with age related involutional changes. No acute intracranial hemorrhage, mass effect or shift. No hydrocephalus. Vascular: No hyperdense vessel or unexpected calcification. Skull: Normal. Negative for fracture or focal lesion. Sinuses/Orbits: No acute finding. IMPRESSION: Atrophy and chronic small vessel ischemic changes. No acute intracranial process identified. Electronically Signed   By: Sydell Eva M.D.   On: 05/07/2024 18:53    Principal Problem:   Acute metabolic encephalopathy Active Problems:   Worsening short-term memory loss   Epidural hematoma (HCC)   Cervical spinal stenosis s/p fusion 5/7   Hyperlipidemia   Generalized anxiety disorder   Chronic depression   Chronic pain syndrome   History of gait instability   Hypothyroidism   Mild cognitive impairment   Insulin  dependent type 2 diabetes mellitus (HCC)   History of choking     Fortune Brannigan Rollene Clink, [facility]  If 7PM-7AM, please contact  night-coverage [email or web address]   LOS: 0 days

## 2024-05-08 NOTE — ED Notes (Signed)
 Patient in hall way agitated and walking with wife. He is alert with some confusion but wife remains at his side, she is asking for something to calm patient to help him relax. He has PRN orders RN will administer.

## 2024-05-09 ENCOUNTER — Inpatient Hospital Stay (HOSPITAL_COMMUNITY)
Admit: 2024-05-09 | Discharge: 2024-05-09 | Disposition: A | Discharge status: Home / Self Care | Attending: Student in an Organized Health Care Education/Training Program

## 2024-05-09 DIAGNOSIS — R569 Unspecified convulsions: Secondary | ICD-10-CM | POA: Diagnosis not present

## 2024-05-09 DIAGNOSIS — G9341 Metabolic encephalopathy: Secondary | ICD-10-CM | POA: Diagnosis not present

## 2024-05-09 DIAGNOSIS — R4182 Altered mental status, unspecified: Secondary | ICD-10-CM | POA: Diagnosis not present

## 2024-05-09 LAB — BASIC METABOLIC PANEL WITH GFR
Anion gap: 10 (ref 5–15)
Anion gap: 10 (ref 5–15)
BUN: 8 mg/dL (ref 8–23)
BUN: 9 mg/dL (ref 8–23)
CO2: 24 mmol/L (ref 22–32)
CO2: 26 mmol/L (ref 22–32)
Calcium: 8.8 mg/dL — ABNORMAL LOW (ref 8.9–10.3)
Calcium: 8.9 mg/dL (ref 8.9–10.3)
Chloride: 100 mmol/L (ref 98–111)
Chloride: 101 mmol/L (ref 98–111)
Creatinine, Ser: 0.68 mg/dL (ref 0.61–1.24)
Creatinine, Ser: 0.73 mg/dL (ref 0.61–1.24)
GFR, Estimated: >60 mL/min (ref >60)
GFR, Estimated: >60 mL/min (ref >60)
Glucose, Bld: 132 mg/dL — ABNORMAL HIGH (ref 70–99)
Glucose, Bld: 154 mg/dL — ABNORMAL HIGH (ref 70–99)
Potassium: 3.4 mmol/L — ABNORMAL LOW (ref 3.5–5.1)
Potassium: 3.5 mmol/L (ref 3.5–5.1)
Sodium: 135 mmol/L (ref 135–145)
Sodium: 136 mmol/L (ref 135–145)

## 2024-05-09 LAB — GLUCOSE, CAPILLARY
Glucose-Capillary: 148 mg/dL — ABNORMAL HIGH (ref 70–99)
Glucose-Capillary: 158 mg/dL — ABNORMAL HIGH (ref 70–99)
Glucose-Capillary: 182 mg/dL — ABNORMAL HIGH (ref 70–99)
Glucose-Capillary: 188 mg/dL — ABNORMAL HIGH (ref 70–99)
Glucose-Capillary: 245 mg/dL — ABNORMAL HIGH (ref 70–99)
Glucose-Capillary: 250 mg/dL — ABNORMAL HIGH (ref 70–99)
Glucose-Capillary: 285 mg/dL — ABNORMAL HIGH (ref 70–99)
Glucose-Capillary: 295 mg/dL — ABNORMAL HIGH (ref 70–99)
Glucose-Capillary: 327 mg/dL — ABNORMAL HIGH (ref 70–99)

## 2024-05-09 LAB — BETA-HYDROXYBUTYRIC ACID: Beta-Hydroxybutyric Acid: 0.55 mmol/L — ABNORMAL HIGH (ref 0.05–0.27)

## 2024-05-09 LAB — CBC
HCT: 44.1 % (ref 39.0–52.0)
Hemoglobin: 14.5 g/dL (ref 13.0–17.0)
MCH: 30.1 pg (ref 26.0–34.0)
MCHC: 32.9 g/dL (ref 30.0–36.0)
MCV: 91.7 fL (ref 80.0–100.0)
Platelets: 322 10*3/uL (ref 150–400)
RBC: 4.81 MIL/uL (ref 4.22–5.81)
RDW: 12.7 % (ref 11.5–15.5)
WBC: 13.4 10*3/uL — ABNORMAL HIGH (ref 4.0–10.5)
nRBC: 0 % (ref 0.0–0.2)

## 2024-05-09 LAB — TSH: TSH: 5.207 u[IU]/mL — ABNORMAL HIGH (ref 0.350–4.500)

## 2024-05-09 LAB — RPR: RPR Ser Ql: NONREACTIVE

## 2024-05-09 LAB — VITAMIN B12: Vitamin B-12: 2439 pg/mL — ABNORMAL HIGH (ref 180–914)

## 2024-05-09 MED ORDER — INSULIN ASPART 100 UNIT/ML IJ SOLN: 0.0000 [IU] | INTRAMUSCULAR | Status: DC

## 2024-05-09 MED ORDER — INSULIN GLARGINE-YFGN 100 UNIT/ML ~~LOC~~ SOLN
5.0000 [IU] | SUBCUTANEOUS | Status: DC
  Filled 2024-05-09: qty 0.05

## 2024-05-09 MED ORDER — INSULIN ASPART 100 UNIT/ML IJ SOLN
0.0000 [IU] | INTRAMUSCULAR | Status: DC
  Administered 2024-05-09 – 2024-05-10 (×3): 5 [IU] via SUBCUTANEOUS
  Administered 2024-05-10: 3 [IU] via SUBCUTANEOUS
  Administered 2024-05-10: 2 [IU] via SUBCUTANEOUS
  Administered 2024-05-10: 8 [IU] via SUBCUTANEOUS
  Administered 2024-05-10: 5 [IU] via SUBCUTANEOUS
  Administered 2024-05-11 (×2): 3 [IU] via SUBCUTANEOUS
  Administered 2024-05-11: 2 [IU] via SUBCUTANEOUS
  Administered 2024-05-11: 3 [IU] via SUBCUTANEOUS
  Administered 2024-05-11: 2 [IU] via SUBCUTANEOUS
  Administered 2024-05-11 – 2024-05-12 (×2): 3 [IU] via SUBCUTANEOUS
  Administered 2024-05-12: 2 [IU] via SUBCUTANEOUS
  Administered 2024-05-12 (×4): 3 [IU] via SUBCUTANEOUS
  Administered 2024-05-13 (×3): 5 [IU] via SUBCUTANEOUS
  Administered 2024-05-13: 8 [IU] via SUBCUTANEOUS
  Administered 2024-05-13: 5 [IU] via SUBCUTANEOUS
  Administered 2024-05-13: 15 [IU] via SUBCUTANEOUS
  Administered 2024-05-14: 11 [IU] via SUBCUTANEOUS
  Administered 2024-05-14: 8 [IU] via SUBCUTANEOUS
  Administered 2024-05-14 (×2): 5 [IU] via SUBCUTANEOUS
  Administered 2024-05-14: 8 [IU] via SUBCUTANEOUS

## 2024-05-09 MED ORDER — INSULIN GLARGINE-YFGN 100 UNIT/ML ~~LOC~~ SOPN: 5.0000 [IU] | PEN_INJECTOR | SUBCUTANEOUS | Status: DC

## 2024-05-09 MED ORDER — HALOPERIDOL LACTATE 5 MG/ML IJ SOLN
5.0000 mg | INTRAMUSCULAR | Status: AC
  Administered 2024-05-09: 5 mg via INTRAMUSCULAR

## 2024-05-09 MED ORDER — CHLORHEXIDINE GLUCONATE CLOTH 2 % EX PADS
6.0000 | MEDICATED_PAD | Freq: Every day | CUTANEOUS | Status: DC
  Administered 2024-05-10 – 2024-05-20 (×12): 6 via TOPICAL

## 2024-05-09 MED ORDER — INSULIN ASPART 100 UNIT/ML IJ SOLN
0.0000 [IU] | Freq: Three times a day (TID) | INTRAMUSCULAR | Status: DC
  Administered 2024-05-09: 5 [IU] via SUBCUTANEOUS
  Administered 2024-05-09: 7 [IU] via SUBCUTANEOUS

## 2024-05-09 MED ORDER — CHLORHEXIDINE GLUCONATE CLOTH 2 % EX PADS: 6.0000 | MEDICATED_PAD | Freq: Every day | CUTANEOUS | Status: DC

## 2024-05-09 MED ORDER — INSULIN GLARGINE-YFGN 100 UNIT/ML ~~LOC~~ SOLN: 12.0000 [IU] | Freq: Every day | SUBCUTANEOUS | Status: DC

## 2024-05-09 MED ORDER — INSULIN ASPART 100 UNIT/ML IJ SOLN
0.0000 [IU] | INTRAMUSCULAR | Status: DC
  Administered 2024-05-09: 5 [IU] via SUBCUTANEOUS

## 2024-05-09 MED ORDER — LACTATED RINGERS IV SOLN: INTRAVENOUS | Status: AC

## 2024-05-09 MED ORDER — INSULIN GLARGINE-YFGN 100 UNIT/ML ~~LOC~~ SOLN
20.0000 [IU] | Freq: Every day | SUBCUTANEOUS | Status: DC
  Administered 2024-05-09 – 2024-05-10 (×2): 20 [IU] via SUBCUTANEOUS
  Filled 2024-05-09 (×2): qty 0.2

## 2024-05-09 MED ORDER — HALOPERIDOL LACTATE 5 MG/ML IJ SOLN: 10.0000 mg | INTRAMUSCULAR | Status: DC | PRN

## 2024-05-09 MED ORDER — STERILE WATER FOR INJECTION IJ SOLN
INTRAMUSCULAR | Status: AC
  Administered 2024-05-09: 2.1 mL
  Filled 2024-05-09: qty 10

## 2024-05-09 MED ORDER — INSULIN ASPART 100 UNIT/ML IJ SOLN: 0.0000 [IU] | Freq: Every day | INTRAMUSCULAR | Status: DC

## 2024-05-09 MED ORDER — INSULIN GLARGINE-YFGN 100 UNIT/ML ~~LOC~~ SOLN
5.0000 [IU] | Freq: Once | SUBCUTANEOUS | Status: AC
  Administered 2024-05-09: 5 [IU] via SUBCUTANEOUS
  Filled 2024-05-09: qty 0.05

## 2024-05-09 MED ORDER — HALOPERIDOL 1 MG PO TABS: 5.0000 mg | ORAL_TABLET | ORAL | Status: AC

## 2024-05-09 NOTE — Progress Notes (Signed)
 PT Cancellation Note  Patient Details Name: Willie [name] MRN: [id number] DOB: Jun 17, 1961   Cancelled Treatment:    Reason Eval/Treat Not Completed: Fatigue/lethargy limiting ability to participate;Patient's level of consciousness.  Abelina Hoes PT Acute Rehabilitation Services Office [phone]     Dareen Ebbing 05/09/2024, 8:50 AM

## 2024-05-09 NOTE — Plan of Care (Signed)
 Chart review follow-up note  B12-indicating supplemented state. TSH mildly elevated RPR nonreactive EEG suggestive of diffuse encephalopathy-no seizures or epileptiform discharges  Clinical picture-likely unmasking and worsening of underlying neurodegenerative process such as dementia  Once clinically stable, will need outpatient follow-up for formal neuropsych testing  Please call inpatient neurology with questions as needed  -- Tona Francis, MD Neurologist [facility]

## 2024-05-09 NOTE — Progress Notes (Signed)
 Routine EEG completed, results pending Neurology review and interpretation

## 2024-05-09 NOTE — Evaluation (Signed)
 SLP Cancellation Note  Patient Details Name: KENT GRABINSKI MRN: [id number] DOB: 12-10-1961   Cancelled treatment:       Reason Eval/Treat Not Completed: Other (comment);Patient's level of consciousness (per RN, pt delirious currently, not appropriate for swallow evaluation; will continue efforts)  Maudie Sorrow, MS [facility] SLP Acute Rehab Services Office [phone]  Chantal Comment 05/09/2024, 8:20 AM

## 2024-05-09 NOTE — Procedures (Signed)
 Patient Name: [name]  MRN: [id number]  Epilepsy Attending: Arleene Lack  Referring Physician/Provider: Tona Francis, MD  Date: 05/09/2024 Duration: 35.05 mins  Patient history: 63yo M with ams. EEG to evaluate for seizure  Level of alertness: Awake/ lethargic   AEDs during EEG study: Ativan  Technical aspects: This EEG study was done with scalp electrodes positioned according to the 10-20 International system of electrode placement. Electrical activity was reviewed with band pass filter of 1-70Hz , sensitivity of 7 uV/mm, display speed of 78mm/sec with a 60Hz  notched filter applied as appropriate. EEG data were recorded continuously and digitally stored.  Video monitoring was available and reviewed as appropriate.  Description: EEG showed continuous generalized 3 to 6 Hz theta-delta slowing admixed with 15 to 18 Hz beta activity distributed symmetrically and diffusely.  Hyperventilation and photic stimulation were not performed.     ABNORMALITY - Continuous slow, generalized  IMPRESSION: This study is suggestive of moderate diffuse encephalopathy. No seizures or epileptiform discharges were seen throughout the recording.  Shaquile Lutze O Dayan Kreis

## 2024-05-09 NOTE — Inpatient Diabetes Management (Addendum)
 Inpatient Diabetes Program Recommendations  AACE/ADA: New Consensus Statement on Inpatient Glycemic Control (2015)  Target Ranges:  Prepandial:   less than 140 mg/dL      Peak postprandial:   less than 180 mg/dL (1-2 hours)      Critically ill patients:  140 - 180 mg/dL   Lab Results  Component Value Date   GLUCAP 285 (H) 05/09/2024   HGBA1C 7.5 (H) 04/16/2024    Review of Glycemic Control  Latest Reference Range & Units 05/09/24 02:33 05/09/24 03:12 05/09/24 04:19 05/09/24 07:08  Glucose-Capillary 70 - 99 mg/dL 161 (H) 096 (H) 045 (H) 285 (H)  (H): Data is abnormally high  Outpatient Diabetes medications:  Medtronic 780 with Dexcom G7 Fiasp  Basal-2.95/hr (70.8) ICR-10 Target-90-140 Current orders for Inpatient glycemic control: Semglee 5 units every day, Novolog  0-9 units TID and 0-5 QHS  Inpatient Diabetes Program Recommendations:    Please increase Semglee to 20 units every day  Addendum@13 :30:  NPO-please consider changing correction to Q4H.  [place] sent to MD.   Will continue to follow while inpatient.  Thank you, Hays Lipschutz, MSN, CDCES Diabetes Coordinator Inpatient Diabetes Program [phone] (team pager from 8a-5p)

## 2024-05-09 NOTE — TOC Progression Note (Addendum)
 Transition of Care [facility]) - Progression Note    Patient Details  Name: Willie [name] MRN: [id number] Date of Birth: 1961-06-16  Transition of [facility] CM/SW Contact  Levie Ream, RN Phone Number: 05/09/2024, 2:27 PM  Clinical Narrative:    Pt oriented x 1 to self; no family at bedside; unable to complete TOC assessment; also awaiting PT eval.  -[id number]- notified by Cody Das at [facility] that pt active w/ agency for PT/OT.      Expected Discharge Plan and Services                                               Social Determinants of Health (SDOH) Interventions SDOH Screenings   Tobacco Use: Medium Risk (05/08/2024)    Readmission Risk Interventions     No data to display

## 2024-05-09 NOTE — Progress Notes (Signed)
 OT Cancellation Note  Patient Details Name: Willie [name] MRN: [id number] DOB: 07-04-1961   Cancelled Treatment:    Reason Eval/Treat Not Completed: Medical issues which prohibited therapy  Patient with decreased level of arousal and lethargy. OT will continue to follow and evaluate patient when patient able to participate.   Harriet Bollen OT/L Acute Rehabilitation Department  [phone]   05/09/2024, 12:20 PM

## 2024-05-09 NOTE — Progress Notes (Signed)
 MB returned back down to [facility] to download EEG data due to no Internet access in the patients room

## 2024-05-09 NOTE — Progress Notes (Signed)
 Pt becoming increasingly agitated necessitating return to 5 point restraints. Pt required 6 people to hold him down while this RN inserted a new IV to give haldol. Dr. Noelle Batten notified.

## 2024-05-09 NOTE — Progress Notes (Addendum)
 PROGRESS NOTE    Willie [name]  ZOX:[id number]  DOB: 02/01/1961  DOA: 05/07/2024 PCP: Willie Coons, MD Outpatient Specialists:   Hospital course:  63 year old male with mild cognitive impairment, GAD chronic pain syndrome DM2 underwent ACDF 05/01/2024 for cervical stenosis and was brought in by his wife for worsening confusion.  Imaging was concerning for possible epidural hematoma with recommendation for MRI and neurosurgery consultation.  Dr. Rochelle [name] came by and saw the patient and does not feel need for MRI but recommended neurology consult for worsening confusion.  Wife states that patient had confusion until his ACDF and then had sudden acute worsening with subsequent agitation, delirium initially starting as sundowning but then progressing over the past 2 weeks to be the whole day.  The last 2 to 3 days patient has been violent and threatening to his wife which is grossly unusual for him.  Patient's wife thinks that he has Lewy body dementia because "he checks all the boxes on [employer]"--of note he has been seen for tremor, has history of recurrent falls.  Subjective:  Patient unable to provide history, sedated  Objective: Vitals:   05/09/24 0900 05/09/24 1000 05/09/24 1100 05/09/24 1200  BP: (!) 146/83 (!) 174/100 (!) 184/85 (!) 162/109  Pulse: (!) 127 (!) 116 (!) 130 (!) 147  Resp: (!) 23 (!) 23 19 (!) 30  Temp:      TempSrc:      SpO2: 90% 96% 100% 97%  Weight:      Height:        Intake/Output Summary (Last 24 hours) at 05/09/2024 1438 Last data filed at 05/09/2024 1100 Gross per 24 hour  Intake 1434.31 ml  Output 2550 ml  Net -1115.69 ml   Filed Weights   05/07/24 1602  Weight: 97.1 kg     Exam:  General: Patient sedated lying in bed with wrist restraints and mitts on Eyes: sclera anicteric, conjuctiva mild injection bilaterally CVS: tachy, regular Respiratory:  decreased air entry bilaterally secondary to decreased inspiratory effort, rales at bases  GI:  NABS, soft, NT  LE: No ecchymoses noted Neuro: Sedated  Data Reviewed:  Basic Metabolic Panel: Recent Labs  Lab 05/08/24 1203 05/08/24 1458 05/08/24 1601 05/08/24 2347 05/09/24 0307  NA 130* 131* 133* 135 136  K 6.2* 3.8 4.0 3.5 3.4*  CL 99 101 101 101 100  CO2 15* 21* 23 24 26   GLUCOSE 288* 180* 151* 132* 154*  BUN 15 13 12 9 8   CREATININE 1.03 0.70 0.75 0.73 0.68  CALCIUM 8.9 8.6* 8.8* 8.8* 8.9    CBC: Recent Labs  Lab 05/07/24 1750 05/08/24 0500 05/09/24 0307  WBC 11.9* 13.9* 13.4*  NEUTROABS 8.5*  --   --   HGB 15.8 14.8 14.5  HCT 47.8 44.3 44.1  MCV 89.8 89.5 91.7  PLT 347 305 322     Scheduled Meds:  Chlorhexidine  Gluconate Cloth  6 each Topical Q2200   insulin  aspart  0-9 Units Subcutaneous Q4H   insulin  glargine-yfgn  20 Units Subcutaneous Daily   melatonin  3 mg Oral QHS   sodium chloride  flush  3 mL Intravenous Q12H   sodium chloride  flush  3 mL Intravenous Q12H   Continuous Infusions:     Assessment & Plan:   DKA--now resolved DM1 previously on insulin  pump And has been weaned off of insulin  drip and is now on glargine with SSI coverage Patient started on glargine 20 units daily, patient's insulin  pump with apparently 70 units per  diabetes coordinator, much appreciate their recommendations apparently Patient presently n.p.o., continue moderate dose SSI every 4 hours  Acute delirium Altered mental status apparently precipitated by recent ACDF Gait instability and h/o falling backwards Possible Lewy body dementia Very much appreciate neurology note by Dr. Evalee [name] Acute delirium is not uncommon in patients with baseline dementia given surgical stress  Workup in progress, EEG, B12, TSH and RPR ordered and pending When patient less agitated and can be safely transported, patient can go to [facility] for MRI of C-spine as recommended. At present now that he is out of DKA, will try antipsychotics to assist with agitation Would like to wean down  benzodiazepines as tolerated  Possible epidural abscess Recent ACDF By Dr. Rochelle [name] this morning who notes that a small epidural abscess is not uncommon after recent ACDF.  He is does not believe MRI or further workup is warranted at this time  Concern for choking As wife notes that he has been choking more since his surgery Will need speech evaluation once he is able to follow directions and take p.o  Groundglass and LUL on CT Reactive airway disease CT cervical spine showed small amount of patchy groundglass disease in LUL Patient is satting at 100% on RA, is afebrile and has a small leukocytosis which is nonspecific especially given acute stresses and DKA. Chest x-ray done is negative. Would obtain a follow-up chest CT when patient is stable and able to tolerate Continue inhaled bronchodilators as needed . Chronic pain syndrome Zanaflex , gabapentin  and oxycodone are being held given acute delirium  Hypothyroidism Synthroid  can be restarted when patient able to take p.o.  Anxiety and depression Would restart meds when patient able to take p.o.   DVT prophylaxis: SCD Code Status: Full code Family Communication: spoke with patient's wife Willie [name]     Studies: MR BRAIN WO CONTRAST Result Date: 05/08/2024 CLINICAL DATA:  63 year old male recently status post spine surgery. Neurologic deficit, short-term memory issues. EXAM: MRI HEAD WITHOUT CONTRAST TECHNIQUE: Multiplanar, multiecho pulse sequences of the brain and surrounding structures were obtained without intravenous contrast. COMPARISON:  Head and cervical spine CT yesterday. FINDINGS: Study is intermittently degraded by motion artifact despite repeated imaging attempts, especially coronal T2 imaging. Brain: No restricted diffusion or evidence of acute infarction. Nonspecific ventriculomegaly as demonstrated on the CT, with lateral ventricles most affected although fairly diminutive temporal horns. Fourth ventricle size  normal. No transependymal edema. No superimposed midline shift, mass effect, evidence of mass lesion, extra-axial collection or acute intracranial hemorrhage. Cervicomedullary junction and pituitary are within normal limits. Centrally normal for age gray and white matter signal throughout the brain. No cortical encephalomalacia or chronic cerebral blood products identified. Deep gray nuclei, brainstem and cerebellum appear negative. Grossly negative mesial temporal lobe structures. Vascular: Major intracranial vascular flow voids are preserved. Skull and upper cervical spine: Negative for age visible cervical spine. Visualized bone marrow signal is within normal limits. Sinuses/Orbits: Postoperative changes to both globes, otherwise negative. Other: Paranasal Visualized paranasal sinuses and mastoids are clear. IMPRESSION: 1. Mildly motion degraded exam.  No acute intracranial abnormality. 2. Nonspecific lateral ventriculomegaly. Otherwise negative for age noncontrast MRI appearance of the Brain. Electronically Signed   By: Marlise Simpers M.D.   On: 05/08/2024 06:49   DG CHEST PORT 1 VIEW Result Date: 05/08/2024 CLINICAL DATA:  63 year old male with recent cervical spine surgery. Ground-glass opacity in the left lung apex on cervical spine CT yesterday. EXAM: PORTABLE CHEST 1 VIEW COMPARISON:  Cervical spine CT  05/07/2024, chest radiographs 12/18/2010. FINDINGS: Portable AP semi upright view at 0425 hours. Cervical ACDF hardware is visible, some was present in 2011. Lung volumes are within normal limits. Improved right perihilar and right lung base ventilation compared to 2011, and when allowing for portable technique the lungs are clear. No pneumothorax or pleural effusion. Mediastinal contours are within normal limits. No acute osseous abnormality identified. Paucity of bowel gas the visible abdomen. IMPRESSION: Negative portable chest. Electronically Signed   By: Marlise Simpers M.D.   On: 05/08/2024 04:56   CT Cervical  Spine Wo Contrast Result Date: 05/08/2024 CLINICAL DATA:  C5-6 ACDF fusion surgery was done 6 days ago. Increasing pain and memory issues. ER also indicates blunt polytrauma. EXAM: CT CERVICAL SPINE WITHOUT CONTRAST TECHNIQUE: Multidetector CT imaging of the cervical spine was performed without intravenous contrast. Multiplanar CT image reconstructions were also generated. RADIATION DOSE REDUCTION: This exam was performed according to the departmental dose-optimization program which includes automated exposure control, adjustment of the mA and/or kV according to patient size and/or use of iterative reconstruction technique. COMPARISON:  Preoperative MRI 03/25/2024. FINDINGS: Alignment: Normal with the exception of bone-on-bone anterior atlantodental joint space loss with prominent osteophytes, and dorsal pannus at C1-2. No traumatic listhesis. Skull base and vertebrae: No fracture or primary pathologic process is seen. The lower half of the pre-existing C6-7 ACDF plate hardware remains in place, the upper half has been removed with interval insertion of well seated C5-6 ACDF plate hardware and securing screws. There is an interbody bone plug or other surgical material. There is solid fusion including the facet joints at C6-7. Bulky anterior bridging enthesopathy C3-5 again noted, anterior nonbridging osteophytes C2-3, anterior bridging enthesopathy again C7-T1 T1-2. Soft tissues and spinal canal: No prevertebral fluid or swelling. No visible canal hematoma. Disc levels: There is preservation of the normal disc heights C2-3 through C4-5, interval ACDF and interbody bone plug or surgical material C5-6 without evidence of hardware loosening, mature chronic fusion of C6-7 and disc space loss C7-T1 and T1-2 chronically. At C2-3 there is a a mild disc bulge indenting the ventral thecal sac, without mass effect At C3-4, posterior disc bulge and dorsal endplate ridging both contribute to mild-to-moderate central canal  stenosis with mild mass effect on the cord flattening its ventral surface. There is uncinate spurring with mild bilateral foraminal narrowing. At C4-5, there is a mild nonstenosing posterior disc osteophyte complex without herniation. Mild uncinate ridging with slight foraminal stenosis. C5-6, plate hardware is again seen from recent surgery. The spinal canal is not optimally seen owing to metal artifact, but there is concern for a left paracentral postoperative disc herniation versus a focal epidural hematoma compressing the left hemicord best seen on 9: 61 and 62 and on sagittal reconstruction image 7:43. Mild uncinate ridging and bilateral mild foraminal stenosis. At C6-7, there is mature fusion. Spinal canal and foramina are patent. At C7-T1 there are degenerative changes of the foraminal or spinal canal mass effect. Upper chest: Small amount patchy ground-glass disease left upper lobe apex, could be due to pneumonia, edema or ground-glass scarring. This was not present on the chest CT dated 07/09/2020. Other: None. IMPRESSION: 1. Interval ACDF at C5-6 with well seated plate hardware and securing screws. 2. The spinal canal is not optimally seen owing to metal artifact at this level, but there is concern for a left paracentral postoperative disc herniation versus a focal epidural hematoma compressing the left hemicord. MRI recommended. 3. No evidence of cervical spine fractures  or traumatic listhesis. 4. Multilevel degenerative changes, with mild-to-moderate central canal stenosis at C3-4 with mild mass effect on the cord, stable since the preoperative MRI. 5. Small amount of patchy ground-glass disease left upper lobe apex, could be due to pneumonia, edema or ground-glass scarring. Follow-up as indicated. Electronically Signed   By: Denman Fischer M.D.   On: 05/08/2024 00:27   CT Head Wo Contrast Result Date: 05/07/2024 CLINICAL DATA:  Mental status change, unknown cause EXAM: CT HEAD WITHOUT CONTRAST  TECHNIQUE: Contiguous axial images were obtained from the base of the skull through the vertex without intravenous contrast. RADIATION DOSE REDUCTION: This exam was performed according to the departmental dose-optimization program which includes automated exposure control, adjustment of the mA and/or kV according to patient size and/or use of iterative reconstruction technique. COMPARISON:  None Available. FINDINGS: Brain: There is periventricular white matter decreased attenuation consistent with small vessel ischemic changes. Ventricles, sulci and cisterns are prominent consistent with age related involutional changes. No acute intracranial hemorrhage, mass effect or shift. No hydrocephalus. Vascular: No hyperdense vessel or unexpected calcification. Skull: Normal. Negative for fracture or focal lesion. Sinuses/Orbits: No acute finding. IMPRESSION: Atrophy and chronic small vessel ischemic changes. No acute intracranial process identified. Electronically Signed   By: Sydell Eva M.D.   On: 05/07/2024 18:53    Principal Problem:   Acute metabolic encephalopathy Active Problems:   Worsening short-term memory loss   Epidural hematoma (HCC)   Cervical spinal stenosis s/p fusion 5/7   Hyperlipidemia   Generalized anxiety disorder   Chronic depression   Chronic pain syndrome   History of gait instability   Hypothyroidism   Mild cognitive impairment   Insulin  dependent type 2 diabetes mellitus (HCC)   History of choking   Altered mental status, unspecified   DKA (diabetic ketoacidosis) (HCC)     Darci Lykins Tublu Haillie Radu, [facility]  If 7PM-7AM, please contact night-coverage [email or web address]   LOS: 1 day

## 2024-05-10 DIAGNOSIS — G9341 Metabolic encephalopathy: Secondary | ICD-10-CM | POA: Diagnosis not present

## 2024-05-10 LAB — BASIC METABOLIC PANEL WITH GFR
Anion gap: 10 (ref 5–15)
BUN: 9 mg/dL (ref 8–23)
CO2: 24 mmol/L (ref 22–32)
Calcium: 8.9 mg/dL (ref 8.9–10.3)
Chloride: 100 mmol/L (ref 98–111)
Creatinine, Ser: 0.75 mg/dL (ref 0.61–1.24)
GFR, Estimated: >60 mL/min (ref >60)
Glucose, Bld: 249 mg/dL — ABNORMAL HIGH (ref 70–99)
Potassium: 3.7 mmol/L (ref 3.5–5.1)
Sodium: 134 mmol/L — ABNORMAL LOW (ref 135–145)

## 2024-05-10 LAB — GLUCOSE, CAPILLARY
Glucose-Capillary: 254 mg/dL — ABNORMAL HIGH (ref 70–99)
Glucose-Capillary: 241 mg/dL — ABNORMAL HIGH (ref 70–99)
Glucose-Capillary: 243 mg/dL — ABNORMAL HIGH (ref 70–99)
Glucose-Capillary: 180 mg/dL — ABNORMAL HIGH (ref 70–99)
Glucose-Capillary: 146 mg/dL — ABNORMAL HIGH (ref 70–99)
Glucose-Capillary: 150 mg/dL — ABNORMAL HIGH (ref 70–99)

## 2024-05-10 MED ORDER — HALOPERIDOL 1 MG PO TABS: 5.0000 mg | ORAL_TABLET | Freq: Once | ORAL | Status: AC

## 2024-05-10 MED ORDER — IPRATROPIUM-ALBUTEROL 0.5-2.5 (3) MG/3ML IN SOLN
3.0000 mL | RESPIRATORY_TRACT | Status: DC | PRN
  Filled 2024-05-10: qty 3

## 2024-05-10 MED ORDER — HALOPERIDOL LACTATE 5 MG/ML IJ SOLN
5.0000 mg | Freq: Once | INTRAMUSCULAR | Status: AC
  Administered 2024-05-10: 5 mg via INTRAMUSCULAR

## 2024-05-10 MED ORDER — MORPHINE SULFATE (PF) 2 MG/ML IV SOLN
2.0000 mg | INTRAVENOUS | Status: DC | PRN
  Administered 2024-05-10: 2 mg via INTRAVENOUS
  Filled 2024-05-10 (×2): qty 1

## 2024-05-10 MED ORDER — METOPROLOL TARTRATE 5 MG/5ML IV SOLN
10.0000 mg | Freq: Four times a day (QID) | INTRAVENOUS | Status: DC
  Administered 2024-05-10 – 2024-05-13 (×11): 10 mg via INTRAVENOUS
  Filled 2024-05-10 (×11): qty 10

## 2024-05-10 MED ORDER — LACTATED RINGERS IV BOLUS
2000.0000 mL | Freq: Once | INTRAVENOUS | Status: AC
  Administered 2024-05-10: 1 mL via INTRAVENOUS

## 2024-05-10 MED ORDER — LORAZEPAM 2 MG/ML IJ SOLN: 2.0000 mg | Freq: Four times a day (QID) | INTRAMUSCULAR | Status: DC | PRN

## 2024-05-10 MED ORDER — HALOPERIDOL LACTATE 5 MG/ML IJ SOLN
10.0000 mg | INTRAMUSCULAR | Status: AC | PRN
  Administered 2024-05-11: 10 mg via INTRAMUSCULAR
  Filled 2024-05-10: qty 2

## 2024-05-10 MED ORDER — METOPROLOL TARTRATE 5 MG/5ML IV SOLN
5.0000 mg | Freq: Four times a day (QID) | INTRAVENOUS | Status: DC
  Administered 2024-05-10: 5 mg via INTRAVENOUS
  Filled 2024-05-10: qty 5

## 2024-05-10 MED ORDER — ACETAMINOPHEN 650 MG RE SUPP
650.0000 mg | Freq: Four times a day (QID) | RECTAL | Status: DC | PRN
  Administered 2024-05-11: 650 mg via RECTAL
  Filled 2024-05-10: qty 1

## 2024-05-10 MED ORDER — INSULIN GLARGINE-YFGN 100 UNIT/ML ~~LOC~~ SOLN: 15.0000 [IU] | Freq: Every day | SUBCUTANEOUS | Status: DC

## 2024-05-10 MED ORDER — INSULIN GLARGINE-YFGN 100 UNIT/ML ~~LOC~~ SOLN
15.0000 [IU] | Freq: Two times a day (BID) | SUBCUTANEOUS | Status: DC
  Administered 2024-05-10 – 2024-05-13 (×6): 15 [IU] via SUBCUTANEOUS
  Filled 2024-05-10 (×7): qty 0.15

## 2024-05-10 MED ORDER — HALOPERIDOL LACTATE 5 MG/ML IJ SOLN
5.0000 mg | Freq: Four times a day (QID) | INTRAMUSCULAR | Status: DC | PRN
  Administered 2024-05-10 – 2024-05-11 (×3): 5 mg via INTRAMUSCULAR
  Filled 2024-05-10 (×5): qty 1

## 2024-05-10 MED ORDER — MORPHINE SULFATE (PF) 2 MG/ML IV SOLN
2.0000 mg | INTRAVENOUS | Status: DC | PRN
  Administered 2024-05-10 (×2): 2 mg via INTRAVENOUS
  Filled 2024-05-10: qty 1

## 2024-05-10 MED ORDER — MORPHINE SULFATE (PF) 2 MG/ML IV SOLN
2.0000 mg | INTRAVENOUS | Status: DC | PRN
  Administered 2024-05-11 – 2024-05-12 (×13): 2 mg via INTRAVENOUS
  Filled 2024-05-10 (×14): qty 1

## 2024-05-10 MED ORDER — HALOPERIDOL 1 MG PO TABS: 5.0000 mg | ORAL_TABLET | Freq: Four times a day (QID) | ORAL | Status: DC | PRN

## 2024-05-10 NOTE — Evaluation (Signed)
 SLP Cancellation Note  Patient Details Name: ESCHOL MELEAR MRN: [id number] DOB: 12-01-1961   Cancelled treatment:       Reason Eval/Treat Not Completed: Other (comment) (Patient remains not appropriate for p.o. at this time due to agitation and requiring intervention, SLP will continue efforts)  Maudie Sorrow, MS [facility] SLP Acute Rehab Services Office [phone]   Chantal Comment 05/10/2024, 1:14 PM

## 2024-05-10 NOTE — Inpatient Diabetes Management (Signed)
 Inpatient Diabetes Program Recommendations  AACE/ADA: New Consensus Statement on Inpatient Glycemic Control (2015)  Target Ranges:  Prepandial:   less than 140 mg/dL      Peak postprandial:   less than 180 mg/dL (1-2 hours)      Critically ill patients:  140 - 180 mg/dL   Lab Results  Component Value Date   GLUCAP 241 (H) 05/10/2024   HGBA1C 7.5 (H) 04/16/2024    Review of Glycemic Control  Latest Reference Range & Units 05/09/24 07:08 05/09/24 13:37 05/09/24 16:08 05/09/24 19:41 05/09/24 23:24 05/10/24 03:21 05/10/24 07:34  Glucose-Capillary 70 - 99 mg/dL 259 (H) 563 (H) 875 (H) 245 (H) 250 (H) 254 (H) 241 (H)  (H): Data is abnormally high  Outpatient Diabetes medications:  Medtronic 780 with Dexcom G7 Fiasp  Basal-2.95/hr (70.8) ICR-10 Target-90-140 Current orders for Inpatient glycemic control: Semglee 20 units every day, Novolog  0-9 units Q4H  Inpatient Diabetes Program Recommendations:    Please consider: Semglee 15 units BID (start tonight).  Will continue to follow while inpatient.  Thank you, Hays Lipschutz, MSN, CDCES Diabetes Coordinator Inpatient Diabetes Program [phone] (team pager from 8a-5p)

## 2024-05-10 NOTE — Progress Notes (Signed)
 PT Cancellation Note  Patient Details Name: Willie [name] MRN: [id number] DOB: 11-14-1961   Cancelled Treatment:    Reason Eval/Treat Not Completed: Patient not medically ready. Pt is currently demonstrating perceived behaviors including agitation impacting pt ability to participate with PT evaluation. PT to continue to follow acutely.   Cary Clarks, PT Acute Rehab   Annalee Kiang 05/10/2024, 12:56 PM

## 2024-05-10 NOTE — Progress Notes (Addendum)
 PROGRESS NOTE    Willie [name]  NUU:[id number]  DOB: 10-23-1961  DOA: 05/07/2024 PCP: Rosslyn Coons, MD Outpatient Specialists:   Hospital course:  63 year old male with mild cognitive impairment, GAD chronic pain syndrome DM2 underwent ACDF 05/01/2024 for cervical stenosis and was brought in by his wife for worsening confusion.  Imaging was concerning for possible epidural hematoma with recommendation for MRI and neurosurgery consultation.  Dr. Rochelle Chu came by and saw the patient and does not feel need for MRI but recommended neurology consult for worsening confusion.  Wife states that patient had confusion until his ACDF and then had sudden acute worsening with subsequent agitation, delirium initially starting as sundowning but then progressing over the past 2 weeks to be the whole day.  The last 2 to 3 days patient has been violent and threatening to his wife which is grossly unusual for him.  Patient's wife thinks that he has Lewy body dementia because "he checks all the boxes on [employer]"--of note he has been seen for tremor, has history of recurrent falls.  Subjective:  Patient unable to provide history, sedated but intermittently agitated   Objective: Vitals:   05/10/24 0829 05/10/24 0900 05/10/24 0958 05/10/24 1000  BP:  (!) 161/83  (!) 191/99  Pulse: (!) 137 (!) 127 (!) 118 (!) 133  Resp: 20 (!) 24 (!) 25 17  Temp:      TempSrc:      SpO2: 98% 98% 97% 98%  Weight:      Height:        Intake/Output Summary (Last 24 hours) at 05/10/2024 1046 Last data filed at 05/10/2024 1003 Gross per 24 hour  Intake 1570.14 ml  Output 1975 ml  Net -404.86 ml   Filed Weights   05/07/24 1602  Weight: 97.1 kg     Exam:  General: Patient sedated lying in bed with mitts on with wife at bedside  Eyes: sclera anicteric, conjuctiva mild injection bilaterally CVS: tachy, regular Respiratory:  decreased air entry bilaterally secondary to decreased inspiratory effort, rales at bases   GI: NABS, soft, NT  LE: No ecchymoses noted Neuro: Sedated  Data Reviewed:  Basic Metabolic Panel: Recent Labs  Lab 05/08/24 1458 05/08/24 1601 05/08/24 2347 05/09/24 0307 05/10/24 0305  NA 131* 133* 135 136 134*  K 3.8 4.0 3.5 3.4* 3.7  CL 101 101 101 100 100  CO2 21* 23 24 26 24   GLUCOSE 180* 151* 132* 154* 249*  BUN 13 12 9 8 9   CREATININE 0.70 0.75 0.73 0.68 0.75  CALCIUM 8.6* 8.8* 8.8* 8.9 8.9    CBC: Recent Labs  Lab 05/07/24 1750 05/08/24 0500 05/09/24 0307  WBC 11.9* 13.9* 13.4*  NEUTROABS 8.5*  --   --   HGB 15.8 14.8 14.5  HCT 47.8 44.3 44.1  MCV 89.8 89.5 91.7  PLT 347 305 322     Scheduled Meds:  Chlorhexidine  Gluconate Cloth  6 each Topical Q2200   insulin  aspart  0-15 Units Subcutaneous Q4H   insulin  glargine-yfgn  15 Units Subcutaneous BID   melatonin  3 mg Oral QHS   metoprolol tartrate  5 mg Intravenous Q6H   sodium chloride  flush  3 mL Intravenous Q12H   sodium chloride  flush  3 mL Intravenous Q12H   Continuous Infusions:  lactated ringers      lactated ringers  125 mL/hr at 05/10/24 1033      Assessment & Plan:    Acute delirium with agitation Has been difficult to manage Of  note, patient is calmer when his wife is present and speaking with him It appears pain likely playing a factor--Per wife, patient on narcotics for chronic lower back pain at home and per RN patient grimacing with minimal repositioning.  Will start prn morphine  sulfate, discontinue ativan and continue prn haldol  HTN Tachycardia Likely some level of beta blocker withdrawal, on Atenolol  50 at home.  BP and HR remain elevated on metoprolol 5 q 6h, will titrate to 10 every 6 Pain is likely contributory as well, hope Titian of morphine  will be helpful  patient likely also has some level of intravascular volume depletion given decreased UO may also be contributory, will provide LR bolus follow.   Altered mental status apparently precipitated by recent ACDF Gait  instability and h/o falling backwards Possible Lewy body dementia Appreciate note by Dr. Bonnita Buttner, given history of gait instability/disorder remotely, patient may well have Lewy body type dementia  EEG, B12, TSH and RPR are negative and nonrevealing When patient less agitated and can be safely transported, patient can go to [facility] for MRI of C-spine as recommended.  Intermittent desaturation possibly secondary to apneic episodes Increase secretions per RN Groundglass and LUL on CT Reactive airway disease Will start on DuoNebs every 6 hours as needed I do not think he is having acute exacerbation, will not start prednisone Would obtain a follow-up chest CT when patient is stable and able to tolerate CT cervical spine showed small amount of patchy groundglass disease in LUL.  DKA--now resolved DM1 previously on insulin  pump And has been weaned off of insulin  drip and is now on glargine with SSI coverage Patient started on glargine 20 units daily, patient's insulin  pump with apparently 70 units per diabetes coordinator, much appreciate their recommendations apparently Patient presently n.p.o., continue moderate dose SSI every 4 hours  Possible epidural abscess Recent ACDF Evaluated by Dr. Rochelle Chu who notes that a small epidural abscess is not uncommon after recent ACDF.  He is does not believe MRI or further workup is warranted at this time  Concern for choking As wife notes that he has been choking more since his surgery Will need speech evaluation once he is able to follow directions and take p.o  Hypothyroidism Synthroid  can be restarted when patient able to take p.o.  Anxiety and depression Would restart meds when patient able to take p.o.   DVT prophylaxis: SCD Code Status: Full code Family Communication: spoke with patient's wife Blakeley Guia     Studies: EEG adult Result Date: 05/09/2024 Arleene Lack, MD     05/09/2024  5:58 PM Patient Name: Willie [name] MRN:  [id number] Epilepsy Attending: Arleene Lack Referring Physician/Provider: Tona Francis, MD Date: 05/09/2024 Duration: 35.05 mins Patient history: 63yo M with ams. EEG to evaluate for seizure Level of alertness: Awake/ lethargic AEDs during EEG study: Ativan Technical aspects: This EEG study was done with scalp electrodes positioned according to the 10-20 International system of electrode placement. Electrical activity was reviewed with band pass filter of 1-70Hz , sensitivity of 7 uV/mm, display speed of 75mm/sec with a 60Hz  notched filter applied as appropriate. EEG data were recorded continuously and digitally stored.  Video monitoring was available and reviewed as appropriate. Description: EEG showed continuous generalized 3 to 6 Hz theta-delta slowing admixed with 15 to 18 Hz beta activity distributed symmetrically and diffusely.  Hyperventilation and photic stimulation were not performed.   ABNORMALITY - Continuous slow, generalized IMPRESSION: This study is suggestive of moderate diffuse encephalopathy.  No seizures or epileptiform discharges were seen throughout the recording. Priyanka Suzanne Erps    Principal Problem:   Acute metabolic encephalopathy Active Problems:   Worsening short-term memory loss   Epidural hematoma (HCC)   Cervical spinal stenosis s/p fusion 5/7   Hyperlipidemia   Generalized anxiety disorder   Chronic depression   Chronic pain syndrome   History of gait instability   Hypothyroidism   Mild cognitive impairment   Insulin  dependent type 2 diabetes mellitus (HCC)   History of choking   Altered mental status, unspecified   DKA (diabetic ketoacidosis) (HCC)     Spurgeon Gancarz Tublu Afra Tricarico, [facility]  If 7PM-7AM, please contact night-coverage [email or web address]   LOS: 2 days

## 2024-05-10 NOTE — Progress Notes (Signed)
 OT Cancellation Note  Patient Details Name: Willie [name] MRN: [id number] DOB: 1961-06-14   Cancelled Treatment:    Reason Eval/Treat Not Completed: Medical issues which prohibited therapy OT re-attempted initial evaluation. Patient unable to participate due to significant agitation this day. Will re-check for eval once patient able to participate and OT schedule allows.   Prabhnoor Ellenberger OT/L Acute Rehabilitation Department  [phone]    05/10/2024, 12:51 PM

## 2024-05-10 NOTE — Progress Notes (Signed)
   05/10/24 1350  Spiritual Encounters  Type of Visit Initial  Care provided to: Family  Referral source Nurse (RN/NT/LPN)  Reason for visit Urgent spiritual support  OnCall Visit No    Chaplain met with the family member of the patient, Abran Abrahams. We stepped away from the patient's room and went to the quiet room. Abran Abrahams shared some of their life story as a couple, both joys and sorrows. With the situation her husband was in she was concerned about their relationship with God.We explored what their relationship is. Abran Abrahams expressed a desire to have a closer relationship and considered what that might look like  I provided a listening presence and encouragement as she expressed what she felt and the burdens she bore. We closed with a prayer and I returned her to her husband's room.   [facility]  [phone]

## 2024-05-10 NOTE — Progress Notes (Signed)
 Patient restless in bed. Wife at bedside and obviously tearful. No new recommendations from nsgy. Continue supportive care.

## 2024-05-10 NOTE — Progress Notes (Signed)
 RT called to give PRN breathing treatment, however, on assessment patient is with no wheezing or SOB and HR 145. Will hold on PRN treatment at this time. RN aware.

## 2024-05-11 DIAGNOSIS — G9341 Metabolic encephalopathy: Secondary | ICD-10-CM | POA: Diagnosis not present

## 2024-05-11 LAB — CBC
HCT: 45.7 % (ref 39.0–52.0)
Hemoglobin: 14.9 g/dL (ref 13.0–17.0)
MCH: 29.9 pg (ref 26.0–34.0)
MCHC: 32.6 g/dL (ref 30.0–36.0)
MCV: 91.6 fL (ref 80.0–100.0)
Platelets: 292 10*3/uL (ref 150–400)
RBC: 4.99 MIL/uL (ref 4.22–5.81)
RDW: 12.8 % (ref 11.5–15.5)
WBC: 10.1 10*3/uL (ref 4.0–10.5)
nRBC: 0 % (ref 0.0–0.2)

## 2024-05-11 LAB — BASIC METABOLIC PANEL WITH GFR
Anion gap: 9 (ref 5–15)
BUN: 7 mg/dL — ABNORMAL LOW (ref 8–23)
CO2: 26 mmol/L (ref 22–32)
Calcium: 8.6 mg/dL — ABNORMAL LOW (ref 8.9–10.3)
Chloride: 98 mmol/L (ref 98–111)
Creatinine, Ser: 0.68 mg/dL (ref 0.61–1.24)
GFR, Estimated: >60 mL/min (ref >60)
Glucose, Bld: 175 mg/dL — ABNORMAL HIGH (ref 70–99)
Potassium: 3.5 mmol/L (ref 3.5–5.1)
Sodium: 133 mmol/L — ABNORMAL LOW (ref 135–145)

## 2024-05-11 LAB — GLUCOSE, CAPILLARY
Glucose-Capillary: 172 mg/dL — ABNORMAL HIGH (ref 70–99)
Glucose-Capillary: 192 mg/dL — ABNORMAL HIGH (ref 70–99)
Glucose-Capillary: 178 mg/dL — ABNORMAL HIGH (ref 70–99)
Glucose-Capillary: 172 mg/dL — ABNORMAL HIGH (ref 70–99)
Glucose-Capillary: 180 mg/dL — ABNORMAL HIGH (ref 70–99)

## 2024-05-11 LAB — CK: Total CK: 540 U/L — ABNORMAL HIGH (ref 49–397)

## 2024-05-11 MED ORDER — HALOPERIDOL 1 MG PO TABS: 5.0000 mg | ORAL_TABLET | ORAL | Status: AC | PRN

## 2024-05-11 MED ORDER — HALOPERIDOL LACTATE 5 MG/ML IJ SOLN
5.0000 mg | Freq: Once | INTRAMUSCULAR | Status: AC
  Administered 2024-05-11: 5 mg via INTRAVENOUS
  Filled 2024-05-11: qty 1

## 2024-05-11 MED ORDER — LORAZEPAM 2 MG/ML IJ SOLN
1.0000 mg | Freq: Once | INTRAMUSCULAR | Status: AC
  Administered 2024-05-11: 1 mg via INTRAVENOUS
  Filled 2024-05-11: qty 1

## 2024-05-11 MED ORDER — HALOPERIDOL LACTATE 5 MG/ML IJ SOLN: 5.0000 mg | Freq: Four times a day (QID) | INTRAMUSCULAR | Status: DC | PRN

## 2024-05-11 MED ORDER — OLANZAPINE 5 MG PO TBDP
5.0000 mg | ORAL_TABLET | Freq: Every day | ORAL | Status: DC
  Administered 2024-05-12 – 2024-05-13 (×2): 5 mg via ORAL
  Filled 2024-05-11 (×4): qty 1

## 2024-05-11 MED ORDER — HALOPERIDOL 1 MG PO TABS: 5.0000 mg | ORAL_TABLET | ORAL | Status: DC | PRN

## 2024-05-11 MED ORDER — HALOPERIDOL LACTATE 5 MG/ML IJ SOLN
5.0000 mg | INTRAMUSCULAR | Status: AC | PRN
  Administered 2024-05-11 – 2024-05-12 (×4): 5 mg via INTRAMUSCULAR
  Filled 2024-05-11 (×4): qty 1

## 2024-05-11 NOTE — Progress Notes (Signed)
 This patient is unable to take PO medications at this time. He continues to be intermittently agitated, restless, and unable to follow commands. Any attempts to place something in his mouth (temp probe, Yankauer, etc) leads this patient to start thrashing in the bed and he bites down on whatever is placed. Diet order is NPO at this time. Bedside swallow evaluation cannot be completed at this time to assess if that can be progressed.

## 2024-05-11 NOTE — Progress Notes (Signed)
 SLP Cancellation Note  Patient Details Name: Willie [name] MRN: [id number] DOB: 04-01-1961   Cancelled treatment:       Reason Eval/Treat Not Completed: Other (comment) (per RN, patient continues with agitation and is in four-point restraints. SLP will follow for readiness.)  Jacqualine Mater, MA, CCC-SLP Speech Therapy

## 2024-05-11 NOTE — Progress Notes (Signed)
 PROGRESS NOTE    Willie [name]  WUJ:[id number]  DOB: 1961/07/02  DOA: 05/07/2024 PCP: Rosslyn Coons, MD Outpatient Specialists:   Hospital course:  As per prior documentation: "63 year old male with mild cognitive impairment, GAD chronic pain syndrome DM2 underwent ACDF 05/01/2024 for cervical stenosis and was brought in by his wife for worsening confusion.  Imaging was concerning for possible epidural hematoma with recommendation for MRI and neurosurgery consultation.  Dr. Rochelle Chu came by and saw the patient and does not feel need for MRI but recommended neurology consult for worsening confusion.  Wife states that patient had confusion until his ACDF and then had sudden acute worsening with subsequent agitation, delirium initially starting as sundowning but then progressing over the past 2 weeks to be the whole day.  The last 2 to 3 days patient has been violent and threatening to his wife which is grossly unusual for him.  Patient's wife thinks that he has Lewy body dementia because "he checks all the boxes on [employer]"--of note he has been seen for tremor, has history of recurrent falls".  05/11/2024: Patient seen alongside patient's nurse.  Met with patient's wife and discussed extensively.  Patient remains difficult to control.  Patient is currently on IV haloperidol  5 Mg every 6 hours as needed.  There are concerns for possible Lewy body dementia.  Patient's wife has asked for haloperidol  to be increased to 10 Mg IV every 6 hourly as needed.  I communicated increased risk of extrapyramidal side effect in patients with likely Lewy body dementia, in addition, in the setting of typical antipsychotic usage.  As a compromise, the wife has agreed to using IV haloperidol  5 Mg every 4 hours as needed.  Will continue to monitor closely for possible extraparametal side effects.  Will introduce olanzapine  sublingual 5 Mg p.o. nightly.  Gradually transition patient to atypical  antipsychotics.  Subjective: Patient is unable to provide any history.  Objective: Vitals:   05/11/24 0815 05/11/24 0837 05/11/24 0842 05/11/24 1032  BP: (!) 182/92     Pulse: (!) 107 (!) 111 (!) 110   Resp: 17 15 18    Temp:    98.8 F (37.1 C)  TempSrc:    Oral  SpO2: 99% 100% 98%   Weight:      Height:        Intake/Output Summary (Last 24 hours) at 05/11/2024 1056 Last data filed at 05/11/2024 1013 Gross per 24 hour  Intake 499.69 ml  Output 1640 ml  Net -1140.31 ml   Filed Weights   05/07/24 1602  Weight: 97.1 kg     Exam:  General: Patient is intermittently sedated, but becomes restless once patient starts to wake up.     Eyes: No pallor or jaundice.   CVS: S1-S2, mildly tachycardic. Respiratory: Clear to auscultation.   GI: Soft and nontender. Neuro: Sedated  Data Reviewed:  Basic Metabolic Panel: Recent Labs  Lab 05/08/24 1601 05/08/24 2347 05/09/24 0307 05/10/24 0305 05/11/24 0327  NA 133* 135 136 134* 133*  K 4.0 3.5 3.4* 3.7 3.5  CL 101 101 100 100 98  CO2 23 24 26 24 26   GLUCOSE 151* 132* 154* 249* 175*  BUN 12 9 8 9  7*  CREATININE 0.75 0.73 0.68 0.75 0.68  CALCIUM  8.8* 8.8* 8.9 8.9 8.6*    CBC: Recent Labs  Lab 05/07/24 1750 05/08/24 0500 05/09/24 0307 05/11/24 0327  WBC 11.9* 13.9* 13.4* 10.1  NEUTROABS 8.5*  --   --   --  HGB 15.8 14.8 14.5 14.9  HCT 47.8 44.3 44.1 45.7  MCV 89.8 89.5 91.7 91.6  PLT 347 305 322 292     Scheduled Meds:  Chlorhexidine  Gluconate Cloth  6 each Topical Q2200   insulin  aspart  0-15 Units Subcutaneous Q4H   insulin  glargine-yfgn  15 Units Subcutaneous BID   melatonin  3 mg Oral QHS   metoprolol  tartrate  10 mg Intravenous Q6H   sodium chloride  flush  3 mL Intravenous Q12H   sodium chloride  flush  3 mL Intravenous Q12H   Continuous Infusions:      Assessment & Plan:    Acute delirium with agitation -Change IV Haldol  to 5 Mg every 4 hours as needed. - Sublingual olanzapine  5 Mg p.o.  nightly. - Delirium protocol. - Watch closely for symptoms and signs of extrapyramidal side effects.    HTN Tachycardia - Continue IV Lopressor . - Clonidine patch 0.1 Mg - Manage expectantly.  Altered mental status apparently precipitated by recent ACDF Gait instability and h/o falling backwards Possible Lewy body dementia Appreciate note by Dr. Bonnita Buttner, given history of gait instability/disorder remotely, patient may well have Lewy body type dementia  EEG, B12, TSH and RPR are negative and nonrevealing When patient less agitated and can be safely transported, patient can go to [facility] for MRI of C-spine as recommended. 05/11/2024: Cautious use of antipsychotics.  Intermittent desaturation possibly secondary to apneic episodes Increase secretions per RN Groundglass and LUL on CT Reactive airway disease Will start on DuoNebs every 6 hours as needed I do not think he is having acute exacerbation, will not start prednisone Would obtain a follow-up chest CT when patient is stable and able to tolerate CT cervical spine showed small amount of patchy groundglass disease in LUL. 05/11/2024: Resolved significantly.  DKA--now resolved DM1 previously on insulin  pump -Patient has been weaned off of insulin  drip and is now on glargine with SSI coverage -Patient is on 15 units of subcutaneous Semglee  twice daily, and sliding scale insulin  coverage.   Possible epidural abscess Recent ACDF Evaluated by Dr. Rochelle Chu who notes that a small epidural abscess is not uncommon after recent ACDF.  He does not believe MRI or further workup is warranted at this time  Concern for choking As wife notes that he has been choking more since his surgery Will need speech evaluation once he is able to follow directions and take p.o  Hypothyroidism Synthroid  can be restarted when patient able to take p.o.  Anxiety and depression Would restart meds when patient able to take p.o.   DVT prophylaxis: SCD Code  Status: Full code Family Communication: spoke with patient's wife Willie [name]     Studies: EEG adult Result Date: 05/09/2024 Arleene Lack, MD     05/09/2024  5:58 PM Patient Name: Willie [name] MRN: [id number] Epilepsy Attending: Arleene Lack Referring Physician/Provider: Tona Francis, MD Date: 05/09/2024 Duration: 35.05 mins Patient history: 63yo M with ams. EEG to evaluate for seizure Level of alertness: Awake/ lethargic AEDs during EEG study: Ativan  Technical aspects: This EEG study was done with scalp electrodes positioned according to the 10-20 International system of electrode placement. Electrical activity was reviewed with band pass filter of 1-70Hz , sensitivity of 7 uV/mm, display speed of 3mm/sec with a 60Hz  notched filter applied as appropriate. EEG data were recorded continuously and digitally stored.  Video monitoring was available and reviewed as appropriate. Description: EEG showed continuous generalized 3 to 6 Hz theta-delta slowing admixed with 15 to  18 Hz beta activity distributed symmetrically and diffusely.  Hyperventilation and photic stimulation were not performed.   ABNORMALITY - Continuous slow, generalized IMPRESSION: This study is suggestive of moderate diffuse encephalopathy. No seizures or epileptiform discharges were seen throughout the recording. Priyanka Suzanne Erps    Principal Problem:   Acute metabolic encephalopathy Active Problems:   Hyperlipidemia   Generalized anxiety disorder   Chronic depression   Worsening short-term memory loss   Epidural hematoma (HCC)   Chronic pain syndrome   Cervical spinal stenosis s/p fusion 5/7   History of gait instability   Hypothyroidism   Mild cognitive impairment   Insulin  dependent type 2 diabetes mellitus (HCC)   History of choking   Altered mental status, unspecified   DKA (diabetic ketoacidosis) (HCC)   Time spent: 55 minutes.  Doroteo Gasmen, [facility]  If 7PM-7AM, please contact  night-coverage [email or web address]   LOS: 3 days

## 2024-05-11 NOTE — Plan of Care (Signed)
  Problem: Metabolic: Goal: Ability to maintain appropriate glucose levels will improve Outcome: Progressing   Problem: Cardiac: Goal: Ability to maintain an adequate cardiac output will improve Outcome: Progressing   Problem: Metabolic: Goal: Ability to maintain appropriate glucose levels will improve Outcome: Progressing   Problem: Respiratory: Goal: Will regain and/or maintain adequate ventilation Outcome: Not Progressing Note: Sleep apnea when asleep - 6L [place] needed   Problem: Safety: Goal: Non-violent Restraint(s) Outcome: Not Progressing Note: Had to go to safety cuffs due to breaking out of non-violent restraints twice last night

## 2024-05-11 NOTE — Plan of Care (Signed)
  Problem: Metabolic: Goal: Ability to maintain appropriate glucose levels will improve Outcome: Progressing   Problem: Education: Goal: Ability to describe self-care measures that may prevent or decrease complications (Diabetes Survival Skills Education) will improve Outcome: Not Progressing   Problem: Coping: Goal: Ability to adjust to condition or change in health will improve Outcome: Not Progressing   Problem: Nutritional: Goal: Maintenance of adequate nutrition will improve Outcome: Not Progressing   Problem: Education: Goal: Knowledge of General Education information will improve Description: Including pain rating scale, medication(s)/side effects and non-pharmacologic comfort measures Outcome: Not Progressing   Problem: Clinical Measurements: Goal: Respiratory complications will improve Outcome: Not Progressing Goal: Cardiovascular complication will be avoided Outcome: Not Progressing

## 2024-05-12 DIAGNOSIS — G9341 Metabolic encephalopathy: Secondary | ICD-10-CM | POA: Diagnosis not present

## 2024-05-12 LAB — GLUCOSE, CAPILLARY
Glucose-Capillary: 148 mg/dL — ABNORMAL HIGH (ref 70–99)
Glucose-Capillary: 163 mg/dL — ABNORMAL HIGH (ref 70–99)
Glucose-Capillary: 175 mg/dL — ABNORMAL HIGH (ref 70–99)
Glucose-Capillary: 182 mg/dL — ABNORMAL HIGH (ref 70–99)
Glucose-Capillary: 188 mg/dL — ABNORMAL HIGH (ref 70–99)
Glucose-Capillary: 197 mg/dL — ABNORMAL HIGH (ref 70–99)

## 2024-05-12 LAB — BASIC METABOLIC PANEL WITH GFR
Anion gap: 14 (ref 5–15)
BUN: 11 mg/dL (ref 8–23)
CO2: 26 mmol/L (ref 22–32)
Calcium: 9 mg/dL (ref 8.9–10.3)
Chloride: 96 mmol/L — ABNORMAL LOW (ref 98–111)
Creatinine, Ser: 0.66 mg/dL (ref 0.61–1.24)
GFR, Estimated: >60 mL/min (ref >60)
Glucose, Bld: 163 mg/dL — ABNORMAL HIGH (ref 70–99)
Potassium: 3.5 mmol/L (ref 3.5–5.1)
Sodium: 136 mmol/L (ref 135–145)

## 2024-05-12 NOTE — Plan of Care (Signed)
  Problem: Metabolic: Goal: Ability to maintain appropriate glucose levels will improve Outcome: Progressing   Problem: Coping: Goal: Ability to adjust to condition or change in health will improve Outcome: Not Progressing   Problem: Nutritional: Goal: Maintenance of adequate nutrition will improve Outcome: Not Progressing   Problem: Clinical Measurements: Goal: Respiratory complications will improve Outcome: Not Progressing Goal: Cardiovascular complication will be avoided Outcome: Not Progressing   Problem: Activity: Goal: Risk for activity intolerance will decrease Outcome: Not Progressing   Problem: Nutrition: Goal: Adequate nutrition will be maintained Outcome: Not Progressing   Problem: Coping: Goal: Level of anxiety will decrease Outcome: Not Progressing   Problem: Elimination: Goal: Will not experience complications related to bowel motility Outcome: Not Progressing   Problem: Pain Managment: Goal: General experience of comfort will improve and/or be controlled Outcome: Not Progressing   Problem: Safety: Goal: Ability to remain free from injury will improve Outcome: Not Progressing   Problem: Skin Integrity: Goal: Risk for impaired skin integrity will decrease Outcome: Not Progressing

## 2024-05-12 NOTE — Plan of Care (Signed)
  Problem: Metabolic: Goal: Ability to maintain appropriate glucose levels will improve Outcome: Progressing   Problem: Nutritional: Goal: Maintenance of adequate nutrition will improve Outcome: Not Progressing   Problem: Metabolic: Goal: Ability to maintain appropriate glucose levels will improve Outcome: Progressing   Problem: Nutritional: Goal: Maintenance of adequate nutrition will improve Outcome: Not Progressing   Problem: Safety: Goal: Non-violent Restraint(s) Outcome: Not Progressing   Problem: Clinical Measurements: Goal: Diagnostic test results will improve Outcome: Progressing   Problem: Activity: Goal: Risk for activity intolerance will decrease Outcome: Progressing Note: Not as agitated this shift and was talking in small sentences to nurse when asked questions    Problem: Nutrition: Goal: Adequate nutrition will be maintained Outcome: Not Progressing   Problem: Coping: Goal: Level of anxiety will decrease Outcome: Progressing Note: Less agitated this shift and less haldol  needed to be administered.  Able to undo leg restraints for short periods of time.   Problem: Elimination: Goal: Will not experience complications related to bowel motility Outcome: Not Progressing Note: Has not had a bowel movement since admission  Goal: Will not experience complications related to urinary retention Outcome: Progressing

## 2024-05-12 NOTE — Progress Notes (Signed)
 Patient is able to follow some commands this morning like stick out your tongue and lift your tongue.  He continues to bite down on Yankauer and suction toothbrushes that are put in his mouth for suctioning.  Told MD this morning and he said to try and give bedtime disintegrating tablet of Zyprexa .  Waited for patient to be awake enough to tell me his name and where he was.  Had him stick out his tongue and lift up his tongue.  Explained that I was going to put a pill under his tongue and it will dissolve after a few minutes.  Put Zyprexa  underneath his tongue.  Had to tell the patient to put his tongue down and close his mouth because he was not quite understanding process.  Waited 5 minutes to make sure patient did not pocket pill.  Pill disintegrated.  Verle Gleason, RN

## 2024-05-12 NOTE — Progress Notes (Signed)
 PROGRESS NOTE    Willie [name]  ZOX:[id number]  DOB: November 02, 1961  DOA: 05/07/2024 PCP: Rosslyn Coons, MD Outpatient Specialists:   Hospital course:  As per prior documentation: "63 year old male with mild cognitive impairment, GAD chronic pain syndrome DM2 underwent ACDF 05/01/2024 for cervical stenosis and was brought in by his wife for worsening confusion.  Imaging was concerning for possible epidural hematoma with recommendation for MRI and neurosurgery consultation.  Dr. Rochelle Chu came by and saw the patient and does not feel need for MRI but recommended neurology consult for worsening confusion.  Wife states that patient had confusion until his ACDF and then had sudden acute worsening with subsequent agitation, delirium initially starting as sundowning but then progressing over the past 2 weeks to be the whole day.  The last 2 to 3 days patient has been violent and threatening to his wife which is grossly unusual for him.  Patient's wife thinks that he has Lewy body dementia because "he checks all the boxes on [employer]"--of note he has been seen for tremor, has history of recurrent falls".  05/11/2024: Patient seen alongside patient's nurse.  Met with patient's wife and discussed extensively.  Patient remains difficult to control.  Patient is currently on IV haloperidol  5 Mg every 6 hours as needed.  There are concerns for possible Lewy body dementia.  Patient's wife has asked for haloperidol  to be increased to 10 Mg IV every 6 hourly as needed.  I communicated increased risk of extrapyramidal side effect in patients with likely Lewy body dementia, in addition, in the setting of typical antipsychotic usage.  As a compromise, the wife has agreed to using IV haloperidol  5 Mg every 4 hours as needed.  Will continue to monitor closely for possible extraparametal side effects.  Will introduce olanzapine  sublingual 5 Mg p.o. nightly.  Gradually transition patient to atypical antipsychotics.  05/12/2024:  Patient seen.  Delirium is improving.  Olanzapine  sublingual was not administered last night, apparently, patient will not comply.  Try administering olanzapine  today.  Gradually wean patient off of typical antipsychotics.  Subjective: Patient is more communicative today. No complaints.  Objective: Vitals:   05/12/24 1200 05/12/24 1233 05/12/24 1244 05/12/24 1300  BP: (!) 175/82     Pulse: (!) 123  87 88  Resp: 19  13 12   Temp:  97.7 F (36.5 C)    TempSrc:  Axillary    SpO2: 100%  100% 100%  Weight:      Height:        Intake/Output Summary (Last 24 hours) at 05/12/2024 1504 Last data filed at 05/12/2024 0935 Gross per 24 hour  Intake 13 ml  Output 700 ml  Net -687 ml   Filed Weights   05/07/24 1602  Weight: 97.1 kg     Exam:  General: Patient is more communicative today.  Eyes: No pallor or jaundice.   CVS: S1-S2, mildly tachycardic. Respiratory: Clear to auscultation.   GI: Soft and nontender. Neuro: Pursue all extremities.  Data Reviewed:  Basic Metabolic Panel: Recent Labs  Lab 05/08/24 2347 05/09/24 0307 05/10/24 0305 05/11/24 0327 05/12/24 0314  NA 135 136 134* 133* 136  K 3.5 3.4* 3.7 3.5 3.5  CL 101 100 100 98 96*  CO2 24 26 24 26 26   GLUCOSE 132* 154* 249* 175* 163*  BUN 9 8 9  7* 11  CREATININE 0.73 0.68 0.75 0.68 0.66  CALCIUM  8.8* 8.9 8.9 8.6* 9.0    CBC: Recent Labs  Lab 05/07/24 1750  05/08/24 0500 05/09/24 0307 05/11/24 0327  WBC 11.9* 13.9* 13.4* 10.1  NEUTROABS 8.5*  --   --   --   HGB 15.8 14.8 14.5 14.9  HCT 47.8 44.3 44.1 45.7  MCV 89.8 89.5 91.7 91.6  PLT 347 305 322 292     Scheduled Meds:  Chlorhexidine  Gluconate Cloth  6 each Topical Q2200   insulin  aspart  0-15 Units Subcutaneous Q4H   insulin  glargine-yfgn  15 Units Subcutaneous BID   melatonin  3 mg Oral QHS   metoprolol  tartrate  10 mg Intravenous Q6H   OLANZapine  zydis  5 mg Oral QHS   sodium chloride  flush  3 mL Intravenous Q12H   sodium chloride  flush  3  mL Intravenous Q12H   Continuous Infusions:      Assessment & Plan:    Acute delirium with agitation -Change IV Haldol  to 5 Mg every 4 hours as needed. - Sublingual olanzapine  5 Mg p.o. nightly. - Delirium protocol. - Watch closely for symptoms and signs of extrapyramidal side effects.    HTN Tachycardia - Continue IV Lopressor . - Clonidine patch 0.1 Mg - Manage expectantly.  Altered mental status apparently precipitated by recent ACDF Gait instability and h/o falling backwards Possible Lewy body dementia Appreciate note by Dr. Bonnita Buttner, given history of gait instability/disorder remotely, patient may well have Lewy body type dementia  EEG, B12, TSH and RPR are negative and nonrevealing When patient less agitated and can be safely transported, patient can go to [facility] for MRI of C-spine as recommended. 05/11/2024: Cautious use of antipsychotics.  Intermittent desaturation possibly secondary to apneic episodes Increase secretions per RN Groundglass and LUL on CT Reactive airway disease Will start on DuoNebs every 6 hours as needed I do not think he is having acute exacerbation, will not start prednisone Would obtain a follow-up chest CT when patient is stable and able to tolerate CT cervical spine showed small amount of patchy groundglass disease in LUL. 05/11/2024: Resolved significantly.  DKA--now resolved DM1 previously on insulin  pump -Patient has been weaned off of insulin  drip and is now on glargine with SSI coverage -Patient is on 15 units of subcutaneous Semglee  twice daily, and sliding scale insulin  coverage.   Possible epidural abscess Recent ACDF Evaluated by Dr. Rochelle Chu who notes that a small epidural abscess is not uncommon after recent ACDF.  He does not believe MRI or further workup is warranted at this time  Concern for choking As wife notes that he has been choking more since his surgery Will need speech evaluation once he is able to follow directions  and take p.o  Hypothyroidism Synthroid  can be restarted when patient able to take p.o.  Anxiety and depression Would restart meds when patient able to take p.o.   DVT prophylaxis: SCD Code Status: Full code Family Communication: spoke with patient's wife Tevin Shillingford     Studies: No results found.   Principal Problem:   Acute metabolic encephalopathy Active Problems:   Hyperlipidemia   Generalized anxiety disorder   Chronic depression   Worsening short-term memory loss   Epidural hematoma (HCC)   Chronic pain syndrome   Cervical spinal stenosis s/p fusion 5/7   History of gait instability   Hypothyroidism   Mild cognitive impairment   Insulin  dependent type 2 diabetes mellitus (HCC)   History of choking   Altered mental status, unspecified   DKA (diabetic ketoacidosis) (HCC)   Time spent: 55 minutes.  Doroteo Gasmen, [facility]  If  7PM-7AM, please contact night-coverage [email or web address]   LOS: 4 days

## 2024-05-13 DIAGNOSIS — G9341 Metabolic encephalopathy: Secondary | ICD-10-CM | POA: Diagnosis not present

## 2024-05-13 LAB — BASIC METABOLIC PANEL WITH GFR
Anion gap: 15 (ref 5–15)
BUN: 13 mg/dL (ref 8–23)
CO2: 23 mmol/L (ref 22–32)
Calcium: 9 mg/dL (ref 8.9–10.3)
Chloride: 96 mmol/L — ABNORMAL LOW (ref 98–111)
Creatinine, Ser: 0.67 mg/dL (ref 0.61–1.24)
GFR, Estimated: >60 mL/min (ref >60)
Glucose, Bld: 206 mg/dL — ABNORMAL HIGH (ref 70–99)
Potassium: 3.7 mmol/L (ref 3.5–5.1)
Sodium: 134 mmol/L — ABNORMAL LOW (ref 135–145)

## 2024-05-13 LAB — GLUCOSE, CAPILLARY
Glucose-Capillary: 203 mg/dL — ABNORMAL HIGH (ref 70–99)
Glucose-Capillary: 213 mg/dL — ABNORMAL HIGH (ref 70–99)
Glucose-Capillary: 220 mg/dL — ABNORMAL HIGH (ref 70–99)
Glucose-Capillary: 236 mg/dL — ABNORMAL HIGH (ref 70–99)
Glucose-Capillary: 281 mg/dL — ABNORMAL HIGH (ref 70–99)
Glucose-Capillary: 292 mg/dL — ABNORMAL HIGH (ref 70–99)
Glucose-Capillary: 352 mg/dL — ABNORMAL HIGH (ref 70–99)

## 2024-05-13 MED ORDER — ENSURE ENLIVE PO LIQD
237.0000 mL | Freq: Three times a day (TID) | ORAL | Status: DC
  Administered 2024-05-13 – 2024-05-16 (×3): 237 mL via ORAL

## 2024-05-13 MED ORDER — INSULIN GLARGINE-YFGN 100 UNIT/ML ~~LOC~~ SOLN
20.0000 [IU] | Freq: Two times a day (BID) | SUBCUTANEOUS | Status: DC
  Administered 2024-05-13 – 2024-05-14 (×2): 20 [IU] via SUBCUTANEOUS
  Filled 2024-05-13 (×3): qty 0.2

## 2024-05-13 MED ORDER — CARVEDILOL 3.125 MG PO TABS
3.1250 mg | ORAL_TABLET | Freq: Two times a day (BID) | ORAL | Status: DC
  Administered 2024-05-13 – 2024-05-14 (×2): 3.125 mg via ORAL
  Filled 2024-05-13 (×2): qty 1

## 2024-05-13 MED ORDER — CLONIDINE HCL 0.3 MG/24HR TD PTWK
0.3000 mg | MEDICATED_PATCH | TRANSDERMAL | Status: DC
  Administered 2024-05-13 – 2024-05-20 (×2): 0.3 mg via TRANSDERMAL
  Filled 2024-05-13 (×2): qty 1

## 2024-05-13 MED ORDER — SODIUM CHLORIDE 0.9 % IV SOLN: INTRAVENOUS | Status: AC

## 2024-05-13 MED ORDER — CLONIDINE HCL 0.1 MG/24HR TD PTWK
0.1000 mg | MEDICATED_PATCH | Freq: Once | TRANSDERMAL | Status: DC
  Administered 2024-05-13: 0.1 mg via TRANSDERMAL
  Filled 2024-05-13: qty 1

## 2024-05-13 MED ORDER — METOPROLOL TARTRATE 5 MG/5ML IV SOLN
5.0000 mg | Freq: Four times a day (QID) | INTRAVENOUS | Status: DC | PRN
  Administered 2024-05-13 – 2024-05-14 (×2): 5 mg via INTRAVENOUS
  Filled 2024-05-13 (×2): qty 5

## 2024-05-13 NOTE — Plan of Care (Signed)
  Problem: Metabolic: Goal: Ability to maintain appropriate glucose levels will improve Outcome: Progressing   Problem: Nutritional: Goal: Maintenance of adequate nutrition will improve Outcome: Not Progressing   Problem: Nutritional: Goal: Maintenance of adequate nutrition will improve Outcome: Not Progressing   Problem: Safety: Goal: Non-violent Restraint(s) Outcome: Not Progressing

## 2024-05-13 NOTE — Progress Notes (Signed)
 PT Cancellation Note  Patient Details Name: Willie [name] MRN: [id number] DOB: October 20, 1961   Cancelled Treatment:    Reason Eval/Treat Not Completed: Other (comment) (per RN pt remains agitated and in 4 point restraints. PT signing off, please re-order when appropriate.)  Daymon Evans PT 05/13/2024  Acute Rehabilitation Services  Office [phone]

## 2024-05-13 NOTE — Progress Notes (Signed)
       Overnight   NAME: Willie [name] MRN: [id number] DOB : Sep 22, 1961    Date of Service   05/13/2024   HPI/Events of Note     Notified by RN for continued HTN.  Added Clonidine  patch as noted by Attending:  05/12/24  1504 hrs HTN Tachycardia - Continue IV Lopressor . - Clonidine  patch 0.1 Mg       Interventions/ Plan   Continue all previous Attending/ any Consult orders Watch for and avoid hypotension      Denece Finger BSN MSNA MSN ACNPC-AG Acute Care Nurse Practitioner [facility]

## 2024-05-13 NOTE — Progress Notes (Signed)
 PROGRESS NOTE    Willie [name]  ZOX:[id number]  DOB: [birth year]  DOA: 05/07/2024 PCP: Rosslyn Coons, MD Outpatient Specialists:   Hospital course: Patient is a 63 year old male with past medical history significant for mild cognitive impairment, GAD chronic pain syndrome DM2 underwent ACDF 05/01/2024 for cervical stenosis.  Patient was brought in by his wife for worsening confusion.  Imaging was concerning for possible epidural hematoma with recommendation for MRI and neurosurgery consultation.  Dr. Rochelle Chu came by and saw the patient and does not feel need for MRI but recommended neurology consult for worsening confusion.  Wife states that patient had confusion until his ACDF and then had sudden acute worsening with subsequent agitation, delirium initially starting as sundowning but then progressing over the past 2 weeks to be the whole day.  The last 2 to 3 days patient has been violent and threatening to his wife which is grossly unusual for him.  Patient's wife thinks that he has Lewy body dementia because "he checks all the boxes on [employer]"--of note he has been seen for tremor, has history of recurrent falls".  05/11/2024: Patient seen alongside patient's nurse.  Met with patient's wife and discussed extensively.  Patient remains difficult to control.  Patient is currently on IV haloperidol  5 Mg every 6 hours as needed.  There are concerns for possible Lewy body dementia.  Patient's wife has asked for haloperidol  to be increased to 10 Mg IV every 6 hourly as needed.  I communicated increased risk of extrapyramidal side effect in patients with likely Lewy body dementia, in addition, in the setting of typical antipsychotic usage.  As a compromise, the wife has agreed to using IV haloperidol  5 Mg every 4 hours as needed.  Will continue to monitor closely for possible extraparametal side effects.  Will introduce olanzapine  sublingual 5 Mg p.o. nightly.  Gradually transition patient to atypical  antipsychotics.  05/12/2024: Patient seen.  Delirium is improving.  Olanzapine  sublingual was not administered last night, apparently, patient will not comply.  Try administering olanzapine  today.  Gradually wean patient off of typical antipsychotics.  05/13/2024: Patient seen alongside patient's wife.  Patient is more awake today and communicative.  Patient continues to improve.  Subjective: Patient is more communicative. No complaints.  Objective: Vitals:   05/13/24 0800 05/13/24 0840 05/13/24 0842 05/13/24 0900  BP: (!) 169/85  (!) 176/92   Pulse: (!) 121 (!) 121 (!) 123 (!) 119  Resp: 18 16 16 15   Temp: 98.3 F (36.8 C)     TempSrc: Oral     SpO2: 100% 100% 99% 99%  Weight:      Height:        Intake/Output Summary (Last 24 hours) at 05/13/2024 1007 Last data filed at 05/13/2024 0844 Gross per 24 hour  Intake --  Output 1175 ml  Net -1175 ml   Filed Weights   05/07/24 1602  Weight: 97.1 kg     Exam:  General: Patient is more communicative. Eyes: No pallor or jaundice.   CVS: S1-S2, mildly tachycardic. Respiratory: Clear to auscultation.   GI: Soft and nontender. Neuro: Pursue all extremities.  Data Reviewed:  Basic Metabolic Panel: Recent Labs  Lab 05/09/24 0307 05/10/24 0305 05/11/24 0327 05/12/24 0314 05/13/24 0244  NA 136 134* 133* 136 134*  K 3.4* 3.7 3.5 3.5 3.7  CL 100 100 98 96* 96*  CO2 26 24 26 26 23   GLUCOSE 154* 249* 175* 163* 206*  BUN 8 9 7* 11 13  CREATININE 0.68 0.75 0.68 0.66 0.67  CALCIUM  8.9 8.9 8.6* 9.0 9.0    CBC: Recent Labs  Lab 05/07/24 1750 05/08/24 0500 05/09/24 0307 05/11/24 0327  WBC 11.9* 13.9* 13.4* 10.1  NEUTROABS 8.5*  --   --   --   HGB 15.8 14.8 14.5 14.9  HCT 47.8 44.3 44.1 45.7  MCV 89.8 89.5 91.7 91.6  PLT 347 305 322 292     Scheduled Meds:  carvedilol   3.125 mg Oral BID WC   Chlorhexidine  Gluconate Cloth  6 each Topical Q2200   cloNIDine   0.1 mg Transdermal Once   insulin  aspart  0-15 Units  Subcutaneous Q4H   insulin  glargine-yfgn  15 Units Subcutaneous BID   melatonin  3 mg Oral QHS   OLANZapine  zydis  5 mg Oral QHS   sodium chloride  flush  3 mL Intravenous Q12H   sodium chloride  flush  3 mL Intravenous Q12H   Continuous Infusions:      Assessment & Plan:    Acute delirium with agitation - Continue IV Haldol  to 5 Mg every 4 hours as needed. - Sublingual olanzapine  5 Mg p.o. nightly.  Gradually change to oral atypical antipsychotics, preferably, low-dose quetiapine twice daily.   - Delirium protocol. - Watch closely for symptoms and signs of extrapyramidal side effects.    HTN Tachycardia - Continue IV Lopressor . - Clonidine  patch increased to 0.3 Mg weekly - Manage expectantly.  Altered mental status apparently precipitated by recent ACDF Gait instability and h/o falling backwards Possible Lewy body dementia Appreciate note by Dr. Bonnita Buttner, given history of gait instability/disorder remotely, patient may well have Lewy body type dementia  EEG, B12, TSH and RPR are negative and nonrevealing When patient less agitated and can be safely transported, patient can go to [facility] for MRI of C-spine as recommended. 05/12/2024: Cautious use of antipsychotics.  Patient is slowly improving.  Intermittent desaturation possibly secondary to apneic episodes Increase secretions per RN Groundglass and LUL on CT Reactive airway disease Will start on DuoNebs every 6 hours as needed I do not think he is having acute exacerbation, will not start prednisone Would obtain a follow-up chest CT when patient is stable and able to tolerate CT cervical spine showed small amount of patchy groundglass disease in LUL. 05/11/2024: Resolved significantly.  DKA--now resolved DM1 previously on insulin  pump -Patient has been weaned off of insulin  drip and is now on glargine with SSI coverage -Patient is currently on 15 units of subcutaneous Semglee  twice daily, and sliding scale insulin   coverage. 05/13/2024: Will increase subcutaneous Semglee  to 20 units twice daily.  Continue to monitor blood sugar closely.  Start normal saline 100 cc/h x 20 hours.  Possible epidural abscess Recent ACDF Evaluated by Dr. Rochelle Chu who notes that a small epidural abscess is not uncommon after recent ACDF.  He does not believe MRI or further workup is warranted at this time  Concern for choking As wife notes that he has been choking more since his surgery Will need speech evaluation once he is able to follow directions and take p.o  Hypothyroidism Synthroid  can be restarted when patient able to take p.o.  Anxiety and depression Would restart meds when patient able to take p.o.   DVT prophylaxis: SCD Code Status: Full code Family Communication: spoke with patient's wife Shraga Custard     Studies: No results found.   Principal Problem:   Acute metabolic encephalopathy Active Problems:   Hyperlipidemia   Generalized anxiety disorder  Chronic depression   Worsening short-term memory loss   Epidural hematoma (HCC)   Chronic pain syndrome   Cervical spinal stenosis s/p fusion 5/7   History of gait instability   Hypothyroidism   Mild cognitive impairment   Insulin  dependent type 2 diabetes mellitus (HCC)   History of choking   Altered mental status, unspecified   DKA (diabetic ketoacidosis) (HCC)   Time spent: 35 minutes.  Doroteo Gasmen, [facility]  If 7PM-7AM, please contact night-coverage [email or web address]   LOS: 5 days

## 2024-05-13 NOTE — Progress Notes (Signed)
 OT Cancellation Note  Patient Details Name: MERRELL BORSUK MRN: [id number] DOB: 1961/04/14   Cancelled Treatment:    Reason Eval/Treat Not Completed: Other (comment) Patient remains in 4 point restraints with agitation per nurse. OT to sign off at this time. Please re order if patient becomes more appropriate for therapy.  Wynette Heckler, MS Acute Rehabilitation Department Office# [phone]  05/13/2024, 8:40 AM

## 2024-05-13 NOTE — Progress Notes (Addendum)
 Initial Nutrition Assessment  DOCUMENTATION CODES:   Obesity unspecified  INTERVENTION:  - Diet advancement per SLP.   - If unable to advance oral diet, would recommend placing Cortrak and starting tube feeds as patient now >5 days NPO.  - RD to provide recs in Cortrak placed.   NUTRITION DIAGNOSIS:   Inadequate oral intake related to lethargy/confusion as evidenced by NPO status, energy intake < or equal to 50% for > or equal to 5 days.  GOAL:   Patient will meet greater than or equal to 90% of their needs  MONITOR:   Diet advancement, Weight trends  REASON FOR ASSESSMENT:   NPO/Clear Liquid Diet (NPO x5 days)    ASSESSMENT:   63 y.o. male with PMH significant of mild cognitive impairment, GAD, depression, chronic pain syndrome, reactive airway disease, insulin -dependent DM type II, HLD and cervical stenosis who underwent a spine fusion 05/01/2024 and presented to the ED 5/13 with worsening confusion.   5/13 Admit; NPO  Patient in bed in restraints at time of visit. Remains confused per RN. Wife at bedside.   Wife reports a UBW of 214# and does not feel the patient has lost any weight recently She reports the patient typically eats 2 meals a day (breakfast and dinner) and often has a snack in between. Eating normally up until confusion began. However, wife shares the patient has been having some difficultly with swallowing for the past 3-4 months and often chokes when eating.  Patient has now gone almost 6 days NPO (since admission). Per RN, going to attempt some sips of water  today and SLP also going to see patient.   If unable to advance oral diet, would recommend placing Cortrak and starting tube feeds as patient now >5 days NPO.   Medications reviewed and include: -  Labs reviewed:  Na 134 HA1C 7.5 Blood Glucose 148-220 x24 hours   NUTRITION - FOCUSED PHYSICAL EXAM:  Flowsheet Row Most Recent Value  Orbital Region No depletion  Upper Arm Region No  depletion  Thoracic and Lumbar Region No depletion  Buccal Region No depletion  Temple Region No depletion  Clavicle Bone Region No depletion  Clavicle and Acromion Bone Region No depletion  Scapular Bone Region Unable to assess  Dorsal Hand No depletion  Patellar Region No depletion  Anterior Thigh Region No depletion  Posterior Calf Region No depletion  Edema (RD Assessment) None  Hair Reviewed  Eyes Reviewed  Mouth Unable to assess  Skin Reviewed  Nails Reviewed       Diet Order:   Diet Order             Diet NPO time specified  Diet effective now                   EDUCATION NEEDS:  Education needs have been addressed  Skin:  Skin Assessment: Reviewed RN Assessment  Last BM:  PTA  Height:  Ht Readings from Last 1 Encounters:  05/07/24 5\' 9"  (1.753 m)   Weight:  Wt Readings from Last 1 Encounters:  05/07/24 97.1 kg    BMI:  Body mass index is 31.6 kg/m.  Estimated Nutritional Needs:  Kcal:  1950-2300 kcals Protein:  100-115 grams Fluid:  >/= 2L    Scheryl Cushing RD, LDN Contact via Secure Chat.

## 2024-05-13 NOTE — Evaluation (Signed)
 Clinical/Bedside Swallow Evaluation Patient Details  Name: Willie [name] MRN: [id number] Date of Birth: 10/09/1961  Today's Date: 05/13/2024 Time: SLP Start Time (ACUTE ONLY): 1250 SLP Stop Time (ACUTE ONLY): 1330 SLP Time Calculation (min) (ACUTE ONLY): 40 min  Past Medical History:  Past Medical History:  Diagnosis Date   Anxiety    Arthritis    BPH (benign prostatic hyperplasia)    Depression    Diabetes (HCC)    Diabetic neuropathy (HCC)    Diabetic retinopathy (HCC)    Esophageal stricture    Fibromyalgia    History of kidney stones    Hyperlipidemia    Hypertension    Hypothyroidism    Melanoma (HCC)    OSA (obstructive sleep apnea)    Polyneuropathy    Tremor    Past Surgical History:  Past Surgical History:  Procedure Laterality Date   ANTERIOR CERVICAL DECOMP/DISCECTOMY FUSION N/A 05/01/2024   Procedure: ANTERIOR CERVICAL DECOMPRESSION/DISCECTOMY FUSION  Cervical Five-Cervical Six, remove tether plate Cervical Six-Seven;  Surgeon: Joaquin Mulberry, MD;  Location: [facility] OR;  Service: Neurosurgery;  Laterality: N/A;  ACDF - C3-C4 - C4-C5 - C5-C6, remove tether plate W0-9   CARPAL TUNNEL RELEASE Bilateral 2002   CATARACT EXTRACTION     CERVICAL DISC SURGERY  2005   CYST EXCISION  2002   middle finger   CYST EXCISION  2007   left thigh   ESOPHAGEAL DILATION     ROTATOR CUFF REPAIR Left    TRIGGER FINGER RELEASE Bilateral    HPI:  Patient is a 63 y.o. male with PMH: cognitive impairment (undiagnosed but spouse suspects Lewy body dementia), depression, GAD, chronic pain syndrome, h/o gait instability, reactive airway disease, IDDM-2, hypothyroidism, HLD, cervical stenosis. He underwent ACDF surgery 05/01/24. He presented to the hospital on 05/08/24 with increasing confusion and hallucinations  which seems to get worse at night. His spouse reported that he has had worsening choking of food that occured even prior to ACDF surgery. He has been NPO and swallow evaluation has  not been able to be completed due to patient with significant agitation, confusion, requiring 4-point restraints. CT cervical spine showed small amount of patchy, ground glass disease of left upper lobe apex, could be due to PNA, edema or scarring.    Assessment / Plan / Recommendation  Clinical Impression  Patient presents with clinical s/s of dysphagia as per this bedside swallow evaluation. Spouse in room indicated that patient was exhibiting some swallow difficulties prior to ACDF surgery but she denies a significant change in swallow function after ACDF surgery. Patient was somewhat drowsy but was able to remain adequately awake and alert for PO's. SLP assessed his swallow function at bedside with PO's of thin liquids, nectar thick liquids, puree solids and gelatin. He exhibited delayed cough response after thin liquids but no overt s/s aspiration with nectar thick liquids via straw sips. No change in voice or vitals. As patient has h/o dysphagia and has recent ACDF, recommendation is for MBS to objecitvely assess patient's swallow function. MBS will have to wait until patient is no longer in 4-point restraints. In the meantime, SLP recommending intiate PO diet of full liquids/nectar thick and recommendation to carefully monitor and supervise patient. SLP will follow for toleration and ability to complete MBS. SLP Visit Diagnosis: Dysphagia, unspecified (R13.10)    Aspiration Risk  Mild aspiration risk    Diet Recommendation Other (Comment);Nectar-thick liquid (full liquids/nectar thick)    Liquid Administration via: Cup;Straw Medication Administration: Crushed  with puree Supervision: Full supervision/cueing for compensatory strategies;Staff to assist with self feeding Compensations: Slow rate;Small sips/bites Postural Changes: Seated upright at 90 degrees    Other  Recommendations Oral Care Recommendations: Oral care BID;Staff/trained caregiver to provide oral care Caregiver Recommendations:  Have oral suction available    Recommendations for follow up therapy are one component of a multi-disciplinary discharge planning process, led by the attending physician.  Recommendations may be updated based on patient status, additional functional criteria and insurance authorization.  Follow up Recommendations Skilled nursing-short term rehab (<3 hours/day)      Assistance Recommended at Discharge    Functional Status Assessment Patient has had a recent decline in their functional status and demonstrates the ability to make significant improvements in function in a reasonable and predictable amount of time.  Frequency and Duration min 2x/week  2 weeks       Prognosis Prognosis for improved oropharyngeal function: Good Barriers to Reach Goals: Cognitive deficits      Swallow Study   General Date of Onset: 05/08/24 HPI: Patient is a 63 y.o. male with PMH: cognitive impairment (undiagnosed but spouse suspects Lewy body dementia), depression, GAD, chronic pain syndrome, h/o gait instability, reactive airway disease, IDDM-2, hypothyroidism, HLD, cervical stenosis. He underwent ACDF surgery 05/01/24. He presented to the hospital on 05/08/24 with increasing confusion and hallucinations  which seems to get worse at night. His spouse reported that he has had worsening choking of food that occured even prior to ACDF surgery. He has been NPO and swallow evaluation has not been able to be completed due to patient with significant agitation, confusion, requiring 4-point restraints. CT cervical spine showed small amount of patchy, ground glass disease of left upper lobe apex, could be due to PNA, edema or scarring. Type of Study: Bedside Swallow Evaluation Previous Swallow Assessment: none found Diet Prior to this Study: NPO Temperature Spikes Noted: No Respiratory Status: Nasal cannula History of Recent Intubation: No Behavior/Cognition: Alert;Cooperative;Pleasant mood Oral Cavity Assessment: Within  Functional Limits Oral Care Completed by SLP: Recent completion by staff Oral Cavity - Dentition: Adequate natural dentition Self-Feeding Abilities: Total assist Patient Positioning: Upright in bed Baseline Vocal Quality: Normal Volitional Cough: Cognitively unable to elicit Volitional Swallow: Unable to elicit    Oral/Motor/Sensory Function Overall Oral Motor/Sensory Function: Other (comment) (unable to follow all directions for OME but no focal weakness observed)   Ice Chips Ice chips: Within functional limits   Thin Liquid Thin Liquid: Impaired Presentation: Straw Pharyngeal  Phase Impairments: Suspected delayed Swallow;Cough - Delayed    Nectar Thick Nectar Thick Liquid: Within functional limits Presentation: Straw   Honey Thick     Puree Puree: Within functional limits Presentation: Spoon   Solid     Solid: Not tested     Jacqualine Mater, MA, CCC-SLP Speech Therapy

## 2024-05-14 ENCOUNTER — Inpatient Hospital Stay (HOSPITAL_COMMUNITY)

## 2024-05-14 DIAGNOSIS — G3184 Mild cognitive impairment, so stated: Secondary | ICD-10-CM

## 2024-05-14 DIAGNOSIS — R339 Retention of urine, unspecified: Secondary | ICD-10-CM

## 2024-05-14 DIAGNOSIS — R509 Fever, unspecified: Secondary | ICD-10-CM

## 2024-05-14 DIAGNOSIS — E109 Type 1 diabetes mellitus without complications: Secondary | ICD-10-CM

## 2024-05-14 DIAGNOSIS — I10 Essential (primary) hypertension: Secondary | ICD-10-CM

## 2024-05-14 DIAGNOSIS — G4733 Obstructive sleep apnea (adult) (pediatric): Secondary | ICD-10-CM

## 2024-05-14 DIAGNOSIS — M4802 Spinal stenosis, cervical region: Secondary | ICD-10-CM | POA: Diagnosis not present

## 2024-05-14 DIAGNOSIS — G9341 Metabolic encephalopathy: Secondary | ICD-10-CM | POA: Diagnosis not present

## 2024-05-14 LAB — CBC
HCT: 46.9 % (ref 39.0–52.0)
Hemoglobin: 15.5 g/dL (ref 13.0–17.0)
MCH: 29.9 pg (ref 26.0–34.0)
MCHC: 33 g/dL (ref 30.0–36.0)
MCV: 90.4 fL (ref 80.0–100.0)
Platelets: 447 10*3/uL — ABNORMAL HIGH (ref 150–400)
RBC: 5.19 MIL/uL (ref 4.22–5.81)
RDW: 12.6 % (ref 11.5–15.5)
WBC: 15.8 10*3/uL — ABNORMAL HIGH (ref 4.0–10.5)
nRBC: 0 % (ref 0.0–0.2)

## 2024-05-14 LAB — BASIC METABOLIC PANEL WITH GFR
Anion gap: 15 (ref 5–15)
BUN: 18 mg/dL (ref 8–23)
CO2: 23 mmol/L (ref 22–32)
Calcium: 9.4 mg/dL (ref 8.9–10.3)
Chloride: 99 mmol/L (ref 98–111)
Creatinine, Ser: 0.82 mg/dL (ref 0.61–1.24)
GFR, Estimated: >60 mL/min (ref >60)
Glucose, Bld: 301 mg/dL — ABNORMAL HIGH (ref 70–99)
Potassium: 3.6 mmol/L (ref 3.5–5.1)
Sodium: 137 mmol/L (ref 135–145)

## 2024-05-14 LAB — GLUCOSE, CAPILLARY
Glucose-Capillary: 183 mg/dL — ABNORMAL HIGH (ref 70–99)
Glucose-Capillary: 212 mg/dL — ABNORMAL HIGH (ref 70–99)
Glucose-Capillary: 240 mg/dL — ABNORMAL HIGH (ref 70–99)
Glucose-Capillary: 252 mg/dL — ABNORMAL HIGH (ref 70–99)
Glucose-Capillary: 281 mg/dL — ABNORMAL HIGH (ref 70–99)
Glucose-Capillary: 298 mg/dL — ABNORMAL HIGH (ref 70–99)
Glucose-Capillary: 308 mg/dL — ABNORMAL HIGH (ref 70–99)
Glucose-Capillary: 313 mg/dL — ABNORMAL HIGH (ref 70–99)
Glucose-Capillary: 319 mg/dL — ABNORMAL HIGH (ref 70–99)

## 2024-05-14 LAB — BETA-HYDROXYBUTYRIC ACID: Beta-Hydroxybutyric Acid: 2.96 mmol/L — ABNORMAL HIGH (ref 0.05–0.27)

## 2024-05-14 LAB — CK: Total CK: 103 U/L (ref 49–397)

## 2024-05-14 MED ORDER — CLEVIDIPINE BUTYRATE 0.5 MG/ML IV EMUL
0.0000 mg/h | INTRAVENOUS | Status: DC
  Administered 2024-05-14: 20 mg/h via INTRAVENOUS
  Administered 2024-05-14: 16 mg/h via INTRAVENOUS
  Administered 2024-05-14: 2 mg/h via INTRAVENOUS
  Administered 2024-05-15: 16 mg/h via INTRAVENOUS
  Administered 2024-05-15: 18 mg/h via INTRAVENOUS
  Filled 2024-05-14 (×6): qty 100

## 2024-05-14 MED ORDER — LACTATED RINGERS IV BOLUS
20.0000 mL/kg | Freq: Once | INTRAVENOUS | Status: AC
  Administered 2024-05-14: 1942 mL via INTRAVENOUS

## 2024-05-14 MED ORDER — INSULIN ASPART 100 UNIT/ML IJ SOLN: 0.0000 [IU] | INTRAMUSCULAR | Status: DC

## 2024-05-14 MED ORDER — INSULIN REGULAR(HUMAN) IN NACL 100-0.9 UT/100ML-% IV SOLN
INTRAVENOUS | Status: DC
  Administered 2024-05-14: 13 [IU]/h via INTRAVENOUS
  Administered 2024-05-15: 6.5 [IU]/h via INTRAVENOUS
  Filled 2024-05-14 (×2): qty 100

## 2024-05-14 MED ORDER — METOPROLOL TARTRATE 5 MG/5ML IV SOLN
5.0000 mg | Freq: Four times a day (QID) | INTRAVENOUS | Status: DC
Start: 2024-05-14 — End: 2024-05-14

## 2024-05-14 MED ORDER — ENOXAPARIN SODIUM 40 MG/0.4ML IJ SOSY
40.0000 mg | PREFILLED_SYRINGE | INTRAMUSCULAR | Status: DC
  Administered 2024-05-14 – 2024-05-21 (×8): 40 mg via SUBCUTANEOUS
  Filled 2024-05-14 (×8): qty 0.4

## 2024-05-14 MED ORDER — DEXTROSE 50 % IV SOLN: 0.0000 mL | INTRAVENOUS | Status: DC | PRN

## 2024-05-14 MED ORDER — ACETAMINOPHEN 10 MG/ML IV SOLN: 1000.0000 mg | Freq: Four times a day (QID) | INTRAVENOUS | Status: DC | PRN

## 2024-05-14 MED ORDER — DEXTROSE IN LACTATED RINGERS 5 % IV SOLN: INTRAVENOUS | Status: AC

## 2024-05-14 MED ORDER — INSULIN GLARGINE-YFGN 100 UNIT/ML ~~LOC~~ SOLN
23.0000 [IU] | Freq: Two times a day (BID) | SUBCUTANEOUS | Status: DC
  Filled 2024-05-14 (×3): qty 0.23

## 2024-05-14 MED ORDER — THIAMINE HCL 100 MG/ML IJ SOLN
100.0000 mg | INTRAMUSCULAR | Status: DC
  Administered 2024-05-16: 100 mg via INTRAVENOUS
  Filled 2024-05-14: qty 2

## 2024-05-14 MED ORDER — METOPROLOL TARTRATE 5 MG/5ML IV SOLN
5.0000 mg | Freq: Once | INTRAVENOUS | Status: AC
  Administered 2024-05-14: 5 mg via INTRAVENOUS
  Filled 2024-05-14: qty 5

## 2024-05-14 MED ORDER — THIAMINE HCL 100 MG/ML IJ SOLN
500.0000 mg | Freq: Three times a day (TID) | INTRAVENOUS | Status: AC
  Administered 2024-05-15 – 2024-05-16 (×6): 500 mg via INTRAVENOUS
  Filled 2024-05-14 (×6): qty 5

## 2024-05-14 MED ORDER — LACTATED RINGERS IV SOLN: INTRAVENOUS | Status: DC

## 2024-05-14 MED ORDER — POTASSIUM CHLORIDE 10 MEQ/100ML IV SOLN
10.0000 meq | INTRAVENOUS | Status: AC
  Administered 2024-05-14 (×2): 10 meq via INTRAVENOUS
  Filled 2024-05-14 (×2): qty 100

## 2024-05-14 MED ORDER — SODIUM CHLORIDE 0.9 % IV SOLN
2.0000 g | Freq: Every day | INTRAVENOUS | Status: AC
  Administered 2024-05-14 – 2024-05-18 (×5): 2 g via INTRAVENOUS
  Filled 2024-05-14 (×5): qty 20

## 2024-05-14 MED ORDER — HYDRALAZINE HCL 20 MG/ML IJ SOLN
10.0000 mg | Freq: Four times a day (QID) | INTRAMUSCULAR | Status: DC | PRN
  Administered 2024-05-14 – 2024-05-16 (×2): 10 mg via INTRAVENOUS
  Filled 2024-05-14 (×2): qty 1

## 2024-05-14 MED ORDER — CARVEDILOL 12.5 MG PO TABS
12.5000 mg | ORAL_TABLET | Freq: Two times a day (BID) | ORAL | Status: DC
  Filled 2024-05-14: qty 1

## 2024-05-14 NOTE — Plan of Care (Signed)
  Problem: Education: Goal: Ability to describe self-care measures that may prevent or decrease complications (Diabetes Survival Skills Education) will improve Outcome: Progressing Goal: Individualized Educational Video(s) Outcome: Progressing   Problem: Coping: Goal: Ability to adjust to condition or change in health will improve Outcome: Progressing   Problem: Fluid Volume: Goal: Ability to maintain a balanced intake and output will improve Outcome: Progressing   Problem: Health Behavior/Discharge Planning: Goal: Ability to identify and utilize available resources and services will improve Outcome: Progressing Goal: Ability to manage health-related needs will improve Outcome: Progressing   Problem: Skin Integrity: Goal: Risk for impaired skin integrity will decrease Outcome: Progressing   Problem: Tissue Perfusion: Goal: Adequacy of tissue perfusion will improve Outcome: Progressing   Problem: Education: Goal: Ability to describe self-care measures that may prevent or decrease complications (Diabetes Survival Skills Education) will improve Outcome: Progressing Goal: Individualized Educational Video(s) Outcome: Progressing   Problem: Health Behavior/Discharge Planning: Goal: Ability to identify and utilize available resources and services will improve Outcome: Progressing Goal: Ability to manage health-related needs will improve Outcome: Progressing   Problem: Metabolic: Goal: Ability to maintain appropriate glucose levels will improve Outcome: Progressing   Problem: Respiratory: Goal: Will regain and/or maintain adequate ventilation Outcome: Progressing   Problem: Urinary Elimination: Goal: Ability to achieve and maintain adequate renal perfusion and functioning will improve Outcome: Progressing   Problem: Safety: Goal: Non-violent Restraint(s) Outcome: Progressing   Problem: Education: Goal: Knowledge of General Education information will  improve Description: Including pain rating scale, medication(s)/side effects and non-pharmacologic comfort measures Outcome: Progressing   Problem: Health Behavior/Discharge Planning: Goal: Ability to manage health-related needs will improve Outcome: Progressing   Problem: Clinical Measurements: Goal: Ability to maintain clinical measurements within normal limits will improve Outcome: Progressing Goal: Will remain free from infection Outcome: Progressing Goal: Diagnostic test results will improve Outcome: Progressing Goal: Respiratory complications will improve Outcome: Progressing Goal: Cardiovascular complication will be avoided Outcome: Progressing   Problem: Activity: Goal: Risk for activity intolerance will decrease Outcome: Progressing   Problem: Coping: Goal: Level of anxiety will decrease Outcome: Progressing   Problem: Elimination: Goal: Will not experience complications related to bowel motility Outcome: Progressing Goal: Will not experience complications related to urinary retention Outcome: Progressing   Problem: Pain Managment: Goal: General experience of comfort will improve and/or be controlled Outcome: Progressing   Problem: Safety: Goal: Ability to remain free from injury will improve Outcome: Progressing   Problem: Skin Integrity: Goal: Risk for impaired skin integrity will decrease Outcome: Progressing

## 2024-05-14 NOTE — Consult Note (Signed)
 NAME:  Willie [name], MRN:  [id number], DOB:  March 16, 1961, LOS: 6 ADMISSION DATE:  05/07/2024, CONSULTATION DATE:  05/14/24 REFERRING MD:  Thelma Fire, CHIEF COMPLAINT:  AMS   History of Present Illness:  63 year old man w/ hx of cognitive impairment, DM1, depression, chronic pain, DM2, recent C spine fusion who is presenting with worsening confusion after the C spine fusion.  Workup reveals some combination of DKA, polypharmacy, postop delirium or potential Lewy Body Dementia.   Patient has had difficult-to-control agitation requiring restraints and multiple PRNs. Today he is more somnolent and unable to take PO, his Bps have been rising with PRN IVPs unable to control it.  Other issue has been nocturnal desaturations with question of OSA.  PCCM consulted for BP management and need for CCB gtt.  Pertinent  Medical History   Past Medical History:  Diagnosis Date   Anxiety    Arthritis    BPH (benign prostatic hyperplasia)    Depression    Diabetes (HCC)    Diabetic neuropathy (HCC)    Diabetic retinopathy (HCC)    Esophageal stricture    Fibromyalgia    History of kidney stones    Hyperlipidemia    Hypertension    Hypothyroidism    Melanoma (HCC)    OSA (obstructive sleep apnea)    Polyneuropathy    Tremor      Significant Hospital Events: Including procedures, antibiotic start and stop dates in addition to other pertinent events   5/13 admit 5/20 PCCM consult  Interim History / Subjective:  Waxing/waning alertness.  Able to follow simple commands for me.  Objective    Blood pressure (!) 176/65, pulse (!) 133, temperature (!) 100.7 F (38.2 C), temperature source Axillary, resp. rate 20, height 5\' 9"  (1.753 m), weight 97.1 kg, SpO2 99%.        Intake/Output Summary (Last 24 hours) at 05/14/2024 1642 Last data filed at 05/14/2024 1617 Gross per 24 hour  Intake 2216.05 ml  Output 1100 ml  Net 1116.05 ml   Filed Weights   05/07/24 1602  Weight: 97.1 kg     Examination: General: flushed appearing HENT: MMM, malampatti 4 Lungs: clear, no wheezing Cardiovascular: tachy ext warm Abdomen: soft, +BS Extremities: no edema Neuro: moves x 4, oriented to self Cogwheeling rigidity in upper arms, lower arms flaccid GU: mostly clear urine foley  Labs/imaging including MRI reviewed  Resolved problem list   Assessment and Plan  Acute metabolic encephalopathy- waxing waning mental status most c/w delirium; has been excited agitation up until today.  Does not really fit any withdrawal syndrome. Urinary retention- foley in place Fever Hypertension unable to take PO IDDM1 Moderate sleep apnea- see Sood's PSG 2022; did not want to wear due to cost and compliance; would not worry about nocturnal dsats Background potential progressive dementia disorder  - Check BMP and beta hydroxybutyrate to make sure not back in DKA - Avoid sedating agents as able - Check blood cultures, Pct, CXR - Exchange foley - Ceftriaxone pending Pct - Check ammonia - CCB targeting SBP 160 - onset and exam not c/w neuroleptic malignant syndrome nor PRES - Will need close monitoring - Low threshold for ceribell/ABG if ongoing depressed mental status but more with it with me  Best Practice (right click and "Reselect all SmartList Selections" daily)   Diet/type: Regular consistency (see orders) DVT prophylaxis start lovenox Pressure ulcer(s): per nursing GI prophylaxis: N/A Lines: N/A Foley:  Yes, and it is still needed Code Status:  full code Last date of multidisciplinary goals of care discussion [updated wife at bedside]  Labs   CBC: Recent Labs  Lab 05/07/24 1750 05/08/24 0500 05/09/24 0307 05/11/24 0327  WBC 11.9* 13.9* 13.4* 10.1  NEUTROABS 8.5*  --   --   --   HGB 15.8 14.8 14.5 14.9  HCT 47.8 44.3 44.1 45.7  MCV 89.8 89.5 91.7 91.6  PLT 347 305 322 292    Basic Metabolic Panel: Recent Labs  Lab 05/09/24 0307 05/10/24 0305 05/11/24 0327  05/12/24 0314 05/13/24 0244  NA 136 134* 133* 136 134*  K 3.4* 3.7 3.5 3.5 3.7  CL 100 100 98 96* 96*  CO2 26 24 26 26 23   GLUCOSE 154* 249* 175* 163* 206*  BUN 8 9 7* 11 13  CREATININE 0.68 0.75 0.68 0.66 0.67  CALCIUM  8.9 8.9 8.6* 9.0 9.0   GFR: Estimated Creatinine Clearance: 111.5 mL/min (by C-G formula based on SCr of 0.67 mg/dL). Recent Labs  Lab 05/07/24 1750 05/08/24 0500 05/09/24 0307 05/11/24 0327  WBC 11.9* 13.9* 13.4* 10.1    Liver Function Tests: Recent Labs  Lab 05/07/24 1750 05/08/24 0500  AST 30 30  ALT 38 37  ALKPHOS 82 79  BILITOT 0.7 1.8*  PROT 7.9 7.6  ALBUMIN  4.1 3.8   No results for input(s): "LIPASE", "AMYLASE" in the last 168 hours. Recent Labs  Lab 05/08/24 1458  AMMONIA 29    ABG No results found for: "PHART", "PCO2ART", "PO2ART", "HCO3", "TCO2", "ACIDBASEDEF", "O2SAT"   Coagulation Profile: Recent Labs  Lab 05/07/24 1750  INR 1.0    Cardiac Enzymes: Recent Labs  Lab 05/11/24 0327  CKTOTAL 540*    HbA1C: Hgb A1c MFr Bld  Date/Time Value Ref Range Status  04/16/2024 01:24 PM 7.5 (H) 4.8 - 5.6 % Final    Comment:    (NOTE) Pre diabetes:          5.7%-6.4%  Diabetes:              >6.4%  Glycemic control for   <7.0% adults with diabetes     CBG: Recent Labs  Lab 05/13/24 2334 05/14/24 0349 05/14/24 0808 05/14/24 1150 05/14/24 1535  GLUCAP 281* 240* 212* 281* 308*    Review of Systems:   Too confused to answer  Past Medical History:  He,  has a past medical history of Anxiety, Arthritis, BPH (benign prostatic hyperplasia), Depression, Diabetes (HCC), Diabetic neuropathy (HCC), Diabetic retinopathy (HCC), Esophageal stricture, Fibromyalgia, History of kidney stones, Hyperlipidemia, Hypertension, Hypothyroidism, Melanoma (HCC), OSA (obstructive sleep apnea), Polyneuropathy, and Tremor.   Surgical History:   Past Surgical History:  Procedure Laterality Date   ANTERIOR CERVICAL DECOMP/DISCECTOMY FUSION N/A  05/01/2024   Procedure: ANTERIOR CERVICAL DECOMPRESSION/DISCECTOMY FUSION  Cervical Five-Cervical Six, remove tether plate Cervical Six-Seven;  Surgeon: Joaquin Mulberry, MD;  [facility];  Service: Neurosurgery;  Laterality: N/A;  ACDF - C3-C4 - C4-C5 - C5-C6, remove tether plate Q4-6   CARPAL TUNNEL RELEASE Bilateral 2002   CATARACT EXTRACTION     CERVICAL DISC SURGERY  2005   CYST EXCISION  2002   middle finger   CYST EXCISION  2007   left thigh   ESOPHAGEAL DILATION     ROTATOR CUFF REPAIR Left    TRIGGER FINGER RELEASE Bilateral      Social History:   reports that he has never smoked. He quit smokeless tobacco use about 20 years ago. He reports that he does not currently use  alcohol. He reports that he does not use drugs.   Family History:  His family history includes CAD in his father; COPD in his father; Healthy in his son; Other in his brother; Thyroid disease in his mother; Tremor in his mother.   Allergies Allergies  Allergen Reactions   Other Other (See Comments), Shortness Of Breath and Swelling    Flu vaccine   Latex Rash    Adhesive      Home Medications  Prior to Admission medications   Medication Sig Start Date End Date Taking? Authorizing Provider  albuterol  (VENTOLIN  HFA) 108 (90 Base) MCG/ACT inhaler Inhale 2 puffs into the lungs every 4 (four) hours as needed for wheezing or shortness of breath. 08/21/20  Yes Byrum, Delora Ferry, MD  amitriptyline  (ELAVIL ) 50 MG tablet Take 50 mg by mouth at bedtime. 01/02/17  Yes [provider]  Ascorbic Acid (VITAMIN C) 1000 MG tablet Take 1,000 mg by mouth daily.   Yes [provider]  atenolol  (TENORMIN ) 50 MG tablet Take 50-100 mg by mouth 2 (two) times daily. Take 2 tablets (100 mg) in the morning & Take 1 tablet (50 mg) at bedtime 12/13/16  Yes [provider]  buPROPion  (WELLBUTRIN  SR) 150 MG 12 hr tablet Take 150 mg by mouth 2 (two) times daily.  12/16/16  Yes [provider]   busPIRone  (BUSPAR ) 10 MG tablet Take 10 mg by mouth 2 (two) times daily. 11/14/16  Yes [provider]  ezetimibe  (ZETIA ) 10 MG tablet Take 10 mg by mouth daily. 02/26/24  Yes [provider]  gabapentin  (NEURONTIN ) 300 MG capsule Take 300 mg by mouth 2 (two) times daily.   Yes [provider]  Insulin  NPH, Human,, Isophane, (NOVOLIN N FLEXPEN) 100 UNIT/ML Kiwkpen Inject 0-25 Units into the skin 2 (two) times daily as needed.   Yes [provider]  insulin  regular (NOVOLIN R) 100 units/mL injection Inject 0-20 Units into the skin 3 (three) times daily as needed for high blood sugar.   Yes [provider]  latanoprost  (XALATAN ) 0.005 % ophthalmic solution Place 1 drop into both eyes at bedtime.   Yes [provider]  levothyroxine  (SYNTHROID ) 112 MCG tablet Take 112 mcg by mouth 2 (two) times daily. 12/23/16  Yes [provider]  omeprazole  (PRILOSEC) 40 MG capsule TAKE ONE CAPSULE BY MOUTH EVERY MORNING AND EVERY EVENING 10/04/21  Yes Denson Flake, MD  ondansetron  (ZOFRAN ) 4 MG tablet Take 4 mg by mouth every 8 (eight) hours as needed for nausea or vomiting. 05/07/24  Yes [provider]  rosuvastatin  (CRESTOR ) 10 MG tablet Take 10 mg by mouth 3 (three) times a week. 10/12/16  Yes [provider]  traMADol (ULTRAM) 50 MG tablet Take 50 mg by mouth 3 (three) times daily. 10/17/16  Yes [provider]  vitamin B-12 (CYANOCOBALAMIN) 1000 MCG tablet Take 1,000 mcg by mouth daily.   Yes [provider]  HYDROcodone -acetaminophen  (NORCO) 10-325 MG tablet Take 1 tablet by mouth every 4 (four) hours as needed for moderate pain (pain score 4-6). 05/01/24   Joaquin Mulberry, MD  tiZANidine  (ZANAFLEX ) 4 MG tablet Take 1 tablet (4 mg total) by mouth every 8 (eight) hours as needed for muscle spasms. 05/01/24   Joaquin Mulberry, MD     Critical care time: 31 mins

## 2024-05-14 NOTE — Plan of Care (Signed)
  Problem: Fluid Volume: Goal: Ability to maintain a balanced intake and output will improve Outcome: Progressing   Problem: Nutritional: Goal: Maintenance of adequate nutrition will improve Outcome: Progressing   Problem: Skin Integrity: Goal: Risk for impaired skin integrity will decrease Outcome: Progressing   Problem: Safety: Goal: Non-violent Restraint(s) Outcome: Progressing   Problem: Clinical Measurements: Goal: Respiratory complications will improve Outcome: Progressing Goal: Cardiovascular complication will be avoided Outcome: Progressing   Problem: Elimination: Goal: Will not experience complications related to urinary retention Outcome: Progressing   Problem: Safety: Goal: Ability to remain free from injury will improve Outcome: Progressing

## 2024-05-14 NOTE — Progress Notes (Addendum)
 [facility]                                                                              Willie [name], is a 63 y.o. male, DOB - 1961/11/18, UJW:[id number] Admit date - 05/07/2024    Outpatient Primary MD for the patient is Rosslyn Coons, MD  LOS - 6  days  Chief Complaint  Patient presents with   Memory Loss       Brief summary   Patient is a 63 year old male with mild cognitive impairment, GAD chronic pain syndrome DM2 underwent ACDF 05/01/2024 for cervical stenosis.  Patient was brought in by his wife for worsening confusion.  Imaging was concerning for possible epidural hematoma with recommendation for MRI and neurosurgery consultation.   Neurosurgery and neurology was consulted.  Underwent neurowork-up, so far unrevealing for acute etiology.  Seen by Dr. Rochelle Chu from neurosurgery and recommended neurowork-up and supportive treatment.   Wife states that patient had confusion until his ACDF and then had sudden acute worsening with subsequent agitation, delirium initially starting as sundowning but then progressing over the past 2 weeks to be the whole day.  The last 2 to 3 days patient has been violent and threatening to his wife which is grossly unusual for him.  Patient's wife thinks that he has Lewy body dementia because "he checks all the boxes on [employer]"--of note he has been seen for tremor, has history of recurrent falls".   Assessment & Plan   Acute delirium with agitation - Continue olanzapine  5 mg nightly - Delirium protocol. - Watch closely for symptoms and signs of extrapyramidal side effects.     Hypertensive urgency Tachycardia - Currently on Coreg , clonidine  0.3 mg patch weekly   Addendum 3:30 PM BP persistently high in 200s, tachycardia, patient is not agitated or in pain Given IV Lopressor , increased Coreg , will benefit from IV antihypertensive drip esmolol or Cleviprex to get the BP under control -PCCM consulted, will await  recommendations   Altered mental status apparently precipitated by recent ACDF Gait instability and h/o falling backwards Possible Lewy body dementia -Appreciate neurology and neurosurgery evaluation  - Per neurology, Dr. Bonnita Buttner, given history of gait instability/disorder remotely, patient may well have Lewy body type dementia - EEG, B12, TSH and RPR are negative and nonrevealing -Slowly improving, alert and oriented to himself   Intermittent desaturation possibly secondary to apneic episodes Increase secretions per RN Groundglass and LUL on CT Reactive airway disease -Continue DuoNebs as needed, currently no acute wheezing -CT cervical spine showed small amount of patchy groundglass disease in LUL.   DKA--now resolved DM1 previously on insulin  pump -Patient has been weaned off of insulin  drip and is now on glargine with SSI coverage  CBG (last 3)  Recent Labs    05/14/24 0349 05/14/24 0808 05/14/24 1150  GLUCAP 240* 212* 281*   -Increased Semglee  to 23 units twice daily, continue moderate SSI    Possible epidural abscess Recent ACDF -Patient has been evaluated by Dr. Rochelle Chu who notes that a small epidural abscess is not uncommon after recent ACDF.  He did not feel MRI or further workup is warranted at  this time   Concern for choking As wife notes that he has been choking more since his surgery -SLP evaluation   Hypothyroidism Synthroid  can be restarted when patient able to take p.o.   Anxiety and depression Would restart meds when patient able to take p.o.    Mild protein calorie malnutrition Nutrition Problem: Inadequate oral intake Etiology: lethargy/confusion Signs/Symptoms: NPO status, energy intake < or equal to 50% for > or equal to 5 days Interventions: Refer to RD note for recommendations   Obesity class I Estimated body mass index is 31.6 kg/m as calculated from the following:   Height as of this encounter: 5\' 9"  (1.753 m).   Weight as of this  encounter: 97.1 kg.  Code Status: Full code DVT Prophylaxis:  SCDs Start: 05/08/24 0304 Place TED hose Start: 05/08/24 0304   Level of Care: Level of care: Stepdown Family Communication: Updated patient Disposition Plan:      Remains inpatient appropriate:      Procedures:    Consultants:   Neurosurgery Neurology  Antimicrobials:   Anti-infectives (From admission, onward)    None          Medications  carvedilol   3.125 mg Oral BID WC   Chlorhexidine  Gluconate Cloth  6 each Topical Q2200   cloNIDine   0.3 mg Transdermal Weekly   feeding supplement  237 mL Oral TID BM   insulin  aspart  0-15 Units Subcutaneous Q4H   insulin  glargine-yfgn  20 Units Subcutaneous BID   melatonin  3 mg Oral QHS   OLANZapine  zydis  5 mg Oral QHS   sodium chloride  flush  3 mL Intravenous Q12H   sodium chloride  flush  3 mL Intravenous Q12H      Subjective:   Papa [place] was seen and examined today.  Alert and oriented to himself, no acute issues overnight.  No fevers or chills.  BP somewhat still uncontrolled.  Cooperative, following commands.  Difficult to obtain ROS from the patient.  Objective:   Vitals:   05/14/24 0300 05/14/24 0330 05/14/24 0400 05/14/24 0725  BP:   (!) 166/84 (!) 189/84  Pulse: (!) 131  (!) 129 (!) 129  Resp: 19  20 19   Temp:  99.8 F (37.7 C)  98.3 F (36.8 C)  TempSrc:  Axillary  Oral  SpO2: 100%  100% 98%  Weight:      Height:        Intake/Output Summary (Last 24 hours) at 05/14/2024 1152 Last data filed at 05/14/2024 0942 Gross per 24 hour  Intake 1460.79 ml  Output 950 ml  Net 510.79 ml     Wt Readings from Last 3 Encounters:  05/07/24 97.1 kg  05/01/24 97.3 kg  04/16/24 97.3 kg     Exam General: Alert and oriented x self, NAD Cardiovascular: S1 S2 auscultated,  RRR Respiratory: Clear to auscultation bilaterally, no wheezing, rales or rhonchi Gastrointestinal: Soft, nontender, nondistended, + bowel sounds Ext: no pedal edema  bilaterally Neuro: moving all 4 extremities spontaneously Psych: confused    Data Reviewed:  I have personally reviewed following labs    CBC Lab Results  Component Value Date   WBC 10.1 05/11/2024   RBC 4.99 05/11/2024   HGB 14.9 05/11/2024   HCT 45.7 05/11/2024   MCV 91.6 05/11/2024   MCH 29.9 05/11/2024   PLT 292 05/11/2024   MCHC 32.6 05/11/2024   RDW 12.8 05/11/2024   LYMPHSABS 1.8 05/07/2024   MONOABS 1.2 (H) 05/07/2024   EOSABS 0.2 05/07/2024  BASOSABS 0.1 05/07/2024     Last metabolic panel Lab Results  Component Value Date   NA 134 (L) 05/13/2024   K 3.7 05/13/2024   CL 96 (L) 05/13/2024   CO2 23 05/13/2024   BUN 13 05/13/2024   CREATININE 0.67 05/13/2024   GLUCOSE 206 (H) 05/13/2024   GFRNONAA >60 05/13/2024   GFRAA  12/15/2010    >60        The eGFR has been calculated using the MDRD equation. This calculation has not been validated in all clinical situations. eGFR's persistently <60 mL/min signify possible Chronic Kidney Disease.   CALCIUM  9.0 05/13/2024   PROT 7.6 05/08/2024   ALBUMIN  3.8 05/08/2024   BILITOT 1.8 (H) 05/08/2024   ALKPHOS 79 05/08/2024   AST 30 05/08/2024   ALT 37 05/08/2024   ANIONGAP 15 05/13/2024    CBG (last 3)  Recent Labs    05/14/24 0349 05/14/24 0808 05/14/24 1150  GLUCAP 240* 212* 281*      Coagulation Profile: Recent Labs  Lab 05/07/24 1750  INR 1.0     Radiology Studies: I have personally reviewed the imaging studies  No results found.     Bertram Brocks M.D. [facility] 05/14/2024, 11:52 AM  Available via Epic secure chat 7am-7pm After 7 pm, please refer to night coverage provider listed on amion.

## 2024-05-14 NOTE — Progress Notes (Addendum)
 eLink Physician-Brief Progress Note Patient Name: Willie [name] DOB: Nov 12, 1961 MRN: [id number]   Date of Service  05/14/2024  HPI/Events of Note  Last evaluated evening labs-patient with baseline cognitive impairment, diabetes mellitus, and recent C-spine fusion who presents with worsening confusion.  Complicated laboratory picture-beta hydroxy is elevated signaling ketosis with mild hyperglycemia and essentially normal bicarb.  eICU Interventions  Not a classical DKA picture, but fall somewhere on the spectrum of DKA/HHS with ketosis.   Start insulin  drip, reassess every 4 hours labs   0105 - Rash noted on chest and abdomen. Pics are in chart. Present at start of shift on the abdomen but seems to be expanding?  Not a clear drug reaction or other critical rash.  Continue observation for now.  Review meds with pharmacy in the morning.  Could be associated with known latex rash.  0218 - K+ 3.3, Kcl  Intervention Category Intermediate Interventions: Electrolyte abnormality - evaluation and management  Alvetta Hidrogo 05/14/2024, 7:38 PM

## 2024-05-15 ENCOUNTER — Inpatient Hospital Stay (HOSPITAL_COMMUNITY)

## 2024-05-15 DIAGNOSIS — R509 Fever, unspecified: Secondary | ICD-10-CM | POA: Diagnosis not present

## 2024-05-15 DIAGNOSIS — R339 Retention of urine, unspecified: Secondary | ICD-10-CM | POA: Diagnosis not present

## 2024-05-15 DIAGNOSIS — G9341 Metabolic encephalopathy: Secondary | ICD-10-CM | POA: Diagnosis not present

## 2024-05-15 DIAGNOSIS — I10 Essential (primary) hypertension: Secondary | ICD-10-CM | POA: Diagnosis not present

## 2024-05-15 LAB — LIPID PANEL
Cholesterol: 99 mg/dL (ref 0–200)
HDL: 27 mg/dL — ABNORMAL LOW (ref >40)
LDL Cholesterol: 50 mg/dL (ref 0–99)
Total CHOL/HDL Ratio: 3.7 ratio
Triglycerides: 109 mg/dL (ref <150)
VLDL: 22 mg/dL (ref 0–40)

## 2024-05-15 LAB — GLUCOSE, CAPILLARY
Glucose-Capillary: 131 mg/dL — ABNORMAL HIGH (ref 70–99)
Glucose-Capillary: 151 mg/dL — ABNORMAL HIGH (ref 70–99)
Glucose-Capillary: 161 mg/dL — ABNORMAL HIGH (ref 70–99)
Glucose-Capillary: 167 mg/dL — ABNORMAL HIGH (ref 70–99)
Glucose-Capillary: 183 mg/dL — ABNORMAL HIGH (ref 70–99)
Glucose-Capillary: 196 mg/dL — ABNORMAL HIGH (ref 70–99)
Glucose-Capillary: 203 mg/dL — ABNORMAL HIGH (ref 70–99)
Glucose-Capillary: 204 mg/dL — ABNORMAL HIGH (ref 70–99)
Glucose-Capillary: 205 mg/dL — ABNORMAL HIGH (ref 70–99)
Glucose-Capillary: 206 mg/dL — ABNORMAL HIGH (ref 70–99)
Glucose-Capillary: 207 mg/dL — ABNORMAL HIGH (ref 70–99)
Glucose-Capillary: 212 mg/dL — ABNORMAL HIGH (ref 70–99)
Glucose-Capillary: 212 mg/dL — ABNORMAL HIGH (ref 70–99)
Glucose-Capillary: 216 mg/dL — ABNORMAL HIGH (ref 70–99)
Glucose-Capillary: 220 mg/dL — ABNORMAL HIGH (ref 70–99)
Glucose-Capillary: 236 mg/dL — ABNORMAL HIGH (ref 70–99)
Glucose-Capillary: 264 mg/dL — ABNORMAL HIGH (ref 70–99)

## 2024-05-15 LAB — BETA-HYDROXYBUTYRIC ACID
Beta-Hydroxybutyric Acid: 2.16 mmol/L — ABNORMAL HIGH (ref 0.05–0.27)
Beta-Hydroxybutyric Acid: 1.46 mmol/L — ABNORMAL HIGH (ref 0.05–0.27)
Beta-Hydroxybutyric Acid: 0.64 mmol/L — ABNORMAL HIGH (ref 0.05–0.27)
Beta-Hydroxybutyric Acid: <0.05 mmol/L — ABNORMAL LOW (ref 0.05–0.27)

## 2024-05-15 LAB — BASIC METABOLIC PANEL WITH GFR
Anion gap: 10 (ref 5–15)
Anion gap: 11 (ref 5–15)
Anion gap: 11 (ref 5–15)
Anion gap: 9 (ref 5–15)
BUN: 18 mg/dL (ref 8–23)
BUN: 19 mg/dL (ref 8–23)
BUN: 20 mg/dL (ref 8–23)
BUN: 21 mg/dL (ref 8–23)
CO2: 23 mmol/L (ref 22–32)
CO2: 23 mmol/L (ref 22–32)
CO2: 24 mmol/L (ref 22–32)
CO2: 24 mmol/L (ref 22–32)
Calcium: 8.6 mg/dL — ABNORMAL LOW (ref 8.9–10.3)
Calcium: 8.7 mg/dL — ABNORMAL LOW (ref 8.9–10.3)
Calcium: 8.8 mg/dL — ABNORMAL LOW (ref 8.9–10.3)
Calcium: 8.8 mg/dL — ABNORMAL LOW (ref 8.9–10.3)
Chloride: 100 mmol/L (ref 98–111)
Chloride: 100 mmol/L (ref 98–111)
Chloride: 103 mmol/L (ref 98–111)
Chloride: 103 mmol/L (ref 98–111)
Creatinine, Ser: 0.71 mg/dL (ref 0.61–1.24)
Creatinine, Ser: 0.77 mg/dL (ref 0.61–1.24)
Creatinine, Ser: 0.8 mg/dL (ref 0.61–1.24)
Creatinine, Ser: 0.81 mg/dL (ref 0.61–1.24)
GFR, Estimated: >60 mL/min (ref >60)
GFR, Estimated: >60 mL/min (ref >60)
GFR, Estimated: >60 mL/min (ref >60)
GFR, Estimated: >60 mL/min (ref >60)
Glucose, Bld: 181 mg/dL — ABNORMAL HIGH (ref 70–99)
Glucose, Bld: 216 mg/dL — ABNORMAL HIGH (ref 70–99)
Glucose, Bld: 217 mg/dL — ABNORMAL HIGH (ref 70–99)
Glucose, Bld: 223 mg/dL — ABNORMAL HIGH (ref 70–99)
Potassium: 3.2 mmol/L — ABNORMAL LOW (ref 3.5–5.1)
Potassium: 3.3 mmol/L — ABNORMAL LOW (ref 3.5–5.1)
Potassium: 3.5 mmol/L (ref 3.5–5.1)
Potassium: 3.6 mmol/L (ref 3.5–5.1)
Sodium: 134 mmol/L — ABNORMAL LOW (ref 135–145)
Sodium: 135 mmol/L (ref 135–145)
Sodium: 136 mmol/L (ref 135–145)
Sodium: 136 mmol/L (ref 135–145)

## 2024-05-15 LAB — CBC
HCT: 43.4 % (ref 39.0–52.0)
Hemoglobin: 14.5 g/dL (ref 13.0–17.0)
MCH: 29.9 pg (ref 26.0–34.0)
MCHC: 33.4 g/dL (ref 30.0–36.0)
MCV: 89.5 fL (ref 80.0–100.0)
Platelets: 402 10*3/uL — ABNORMAL HIGH (ref 150–400)
RBC: 4.85 MIL/uL (ref 4.22–5.81)
RDW: 12.2 % (ref 11.5–15.5)
WBC: 16.7 10*3/uL — ABNORMAL HIGH (ref 4.0–10.5)
nRBC: 0 % (ref 0.0–0.2)

## 2024-05-15 LAB — AMMONIA: Ammonia: 22 umol/L (ref 9–35)

## 2024-05-15 LAB — OSMOLALITY: Osmolality: 296 mosm/kg — ABNORMAL HIGH (ref 275–295)

## 2024-05-15 LAB — PROCALCITONIN: Procalcitonin: 0.12 ng/mL

## 2024-05-15 LAB — MAGNESIUM: Magnesium: 1.7 mg/dL (ref 1.7–2.4)

## 2024-05-15 MED ORDER — MELATONIN 3 MG PO TABS
3.0000 mg | ORAL_TABLET | Freq: Every day | ORAL | Status: DC
  Administered 2024-05-16: 3 mg
  Filled 2024-05-15: qty 1

## 2024-05-15 MED ORDER — POTASSIUM CHLORIDE 10 MEQ/100ML IV SOLN
10.0000 meq | INTRAVENOUS | Status: AC
Start: 2024-05-15 — End: 2024-05-15
  Administered 2024-05-15 (×6): 10 meq via INTRAVENOUS
  Filled 2024-05-15 (×6): qty 100

## 2024-05-15 MED ORDER — INSULIN ASPART 100 UNIT/ML IJ SOLN
0.0000 [IU] | INTRAMUSCULAR | Status: DC
  Administered 2024-05-15: 3 [IU] via SUBCUTANEOUS
  Administered 2024-05-15: 7 [IU] via SUBCUTANEOUS
  Administered 2024-05-15: 4 [IU] via SUBCUTANEOUS
  Administered 2024-05-16 (×2): 11 [IU] via SUBCUTANEOUS
  Administered 2024-05-16: 4 [IU] via SUBCUTANEOUS
  Administered 2024-05-16 (×2): 7 [IU] via SUBCUTANEOUS
  Administered 2024-05-17: 4 [IU] via SUBCUTANEOUS
  Administered 2024-05-17: 3 [IU] via SUBCUTANEOUS
  Administered 2024-05-17: 15 [IU] via SUBCUTANEOUS
  Administered 2024-05-17 – 2024-05-18 (×2): 11 [IU] via SUBCUTANEOUS
  Administered 2024-05-18: 15 [IU] via SUBCUTANEOUS
  Administered 2024-05-18 (×2): 4 [IU] via SUBCUTANEOUS
  Administered 2024-05-19: 7 [IU] via SUBCUTANEOUS
  Administered 2024-05-19: 4 [IU] via SUBCUTANEOUS
  Administered 2024-05-19: 20 [IU] via SUBCUTANEOUS

## 2024-05-15 MED ORDER — ONDANSETRON HCL 4 MG/2ML IJ SOLN: 4.0000 mg | Freq: Four times a day (QID) | INTRAMUSCULAR | Status: DC | PRN

## 2024-05-15 MED ORDER — POTASSIUM CHLORIDE 10 MEQ/100ML IV SOLN
10.0000 meq | INTRAVENOUS | Status: AC
Start: 2024-05-15 — End: 2024-05-15
  Administered 2024-05-15 (×6): 10 meq via INTRAVENOUS
  Filled 2024-05-15 (×5): qty 100

## 2024-05-15 MED ORDER — ACETAMINOPHEN 325 MG PO TABS
650.0000 mg | ORAL_TABLET | Freq: Four times a day (QID) | ORAL | Status: DC | PRN
Start: 2024-05-15 — End: 2024-05-16

## 2024-05-15 MED ORDER — MAGNESIUM SULFATE 4 GM/100ML IV SOLN
4.0000 g | Freq: Once | INTRAVENOUS | Status: AC
  Administered 2024-05-15: 4 g via INTRAVENOUS
  Filled 2024-05-15: qty 100

## 2024-05-15 MED ORDER — ONDANSETRON HCL 4 MG PO TABS: 4.0000 mg | ORAL_TABLET | Freq: Four times a day (QID) | ORAL | Status: DC | PRN

## 2024-05-15 MED ORDER — CARVEDILOL 12.5 MG PO TABS
12.5000 mg | ORAL_TABLET | Freq: Two times a day (BID) | ORAL | Status: DC
  Administered 2024-05-16: 12.5 mg via NASOGASTRIC
  Filled 2024-05-15: qty 1

## 2024-05-15 MED ORDER — OLANZAPINE 5 MG PO TBDP
5.0000 mg | ORAL_TABLET | Freq: Every day | ORAL | Status: DC
  Administered 2024-05-16: 5 mg
  Filled 2024-05-15 (×2): qty 1

## 2024-05-15 MED ORDER — PNEUMOCOCCAL 20-VAL CONJ VACC 0.5 ML IM SUSY: 0.5000 mL | PREFILLED_SYRINGE | INTRAMUSCULAR | Status: DC | PRN

## 2024-05-15 MED ORDER — INSULIN GLARGINE-YFGN 100 UNIT/ML ~~LOC~~ SOLN
25.0000 [IU] | Freq: Two times a day (BID) | SUBCUTANEOUS | Status: DC
  Administered 2024-05-15 – 2024-05-18 (×6): 25 [IU] via SUBCUTANEOUS
  Filled 2024-05-15 (×7): qty 0.25

## 2024-05-15 NOTE — Consult Note (Deleted)
 NAME:  Willie [name], MRN:  [id number], DOB:  1961/02/05, LOS: 7 ADMISSION DATE:  05/07/2024, CONSULTATION DATE:  05/14/24 REFERRING MD:  Thelma Fire, CHIEF COMPLAINT:  AMS   History of Present Illness:  63 year old man w/ hx of cognitive impairment, DM1, depression, chronic pain, DM2, recent C spine fusion who is presenting with worsening confusion after the C spine fusion.  Workup reveals some combination of DKA, polypharmacy, postop delirium or potential Lewy Body Dementia.   Patient has had difficult-to-control agitation requiring restraints and multiple PRNs. Today he is more somnolent and unable to take PO, his Bps have been rising with PRN IVPs unable to control it.  Other issue has been nocturnal desaturations with question of OSA.  PCCM consulted for BP management and need for CCB gtt.  Pertinent  Medical History   Past Medical History:  Diagnosis Date   Anxiety    Arthritis    BPH (benign prostatic hyperplasia)    Depression    Diabetes (HCC)    Diabetic neuropathy (HCC)    Diabetic retinopathy (HCC)    Esophageal stricture    Fibromyalgia    History of kidney stones    Hyperlipidemia    Hypertension    Hypothyroidism    Melanoma (HCC)    OSA (obstructive sleep apnea)    Polyneuropathy    Tremor      Significant Hospital Events: Including procedures, antibiotic start and stop dates in addition to other pertinent events   5/13 admit 5/20 PCCM consult 5/22 Insulin  gtt started due to concerns of going back into DKA, weaning down cleviprex gtt   Interim History / Subjective:  No acute distress, following commands, alert- oriented to self, disoriented x 3 Ketone breath noted   Objective    Blood pressure (!) 146/53, pulse (!) 118, temperature 100.1 F (37.8 C), temperature source Axillary, resp. rate 19, height 5\' 9"  (1.753 m), weight 97.1 kg, SpO2 100%.        Intake/Output Summary (Last 24 hours) at 05/15/2024 0853 Last data filed at 05/15/2024 9323 Gross per 24  hour  Intake 5453.64 ml  Output 675 ml  Net 4778.64 ml   Filed Weights   05/07/24 1602  Weight: 97.1 kg    Examination: General: acute on chronic adult male, lying in icu, NAD  HEENT: Normocephalic, PERRLA intact, Pink  CV: s1,s2, RRR, no MRG, No JVD  pulm: clear, diminished, no distress  Abs: bs active, soft  Extremities: no edema, no deformity, moves all extremities on command Skin: rash from lead adhesive, continue to monitor  Neuro: Rass 0, follows commands, oriented to self, disoriented x 3  GU: foley (exchanged on 5/20)   Resolved problem list   Assessment and Plan  Acute metabolic encephalopathy- waxing waning mental status most c/w delirium; has been excited agitation up until today.  Does not really fit any withdrawal syndrome. Urinary retention- foley in place Fever Hypertension  IDDM1 Moderate sleep apnea- see Sood's PSG 2022; did not want to wear due to cost and compliance; would not worry about nocturnal dsats Background potential progressive dementia disorder Ammonia 22, beta hydroxy 2.16 >> 1.46 >> 0.64  Borderline DKA presentation- anion gap 15, CO2 WNL- insulin  gtt restarted on 5/20, Beta hydroxy elevated but trending down.  Leukocytosis 15.8 >> 16.7, procal 0.12  P:  Continue insulin  gtt for now, hopefully to be able to transition later this afternoon off gtt, recheck beta-hydroxy this afternoon, BMET q 4, continue to replace K  Continue  neuro checks and monitor mental status  Continue to monitor urine output per foley Continue ceftriaxone for now, trend WBC  Place cortrak to assist with nutrition and to give oral meds, and to start nutrition  Continue to wean off cleviprex, check lipid panel will be able to oral meds via cortrak  Continue to avoid sedating medications   Best Practice (right click and "Reselect all SmartList Selections" daily)   Diet/type: Regular consistency (see orders) DVT prophylaxis start lovenox Pressure ulcer(s): per  nursing GI prophylaxis: N/A Lines: N/A Foley:  Yes, and it is still needed Code Status:  full code Last date of multidisciplinary goals of care discussion [updated wife at bedside]  Labs   CBC: Recent Labs  Lab 05/09/24 0307 05/11/24 0327 05/14/24 1703 05/15/24 0424  WBC 13.4* 10.1 15.8* 16.7*  HGB 14.5 14.9 15.5 14.5  HCT 44.1 45.7 46.9 43.4  MCV 91.7 91.6 90.4 89.5  PLT 322 292 447* 402*    Basic Metabolic Panel: Recent Labs  Lab 05/13/24 0244 05/14/24 1809 05/15/24 0018 05/15/24 0320 05/15/24 0736  NA 134* 137 134* 135 136  K 3.7 3.6 3.3* 3.2* 3.6  CL 96* 99 100 100 103  CO2 23 23 23 24 23   GLUCOSE 206* 301* 217* 223* 216*  BUN 13 18 19 21 20   CREATININE 0.67 0.82 0.81 0.77 0.71  CALCIUM  9.0 9.4 8.8* 8.7* 8.8*   GFR: Estimated Creatinine Clearance: 111.5 mL/min (by C-G formula based on SCr of 0.71 mg/dL). Recent Labs  Lab 05/09/24 0307 05/11/24 0327 05/14/24 1703 05/15/24 0424  PROCALCITON  --   --   --  0.12  WBC 13.4* 10.1 15.8* 16.7*    Liver Function Tests: No results for input(s): "AST", "ALT", "ALKPHOS", "BILITOT", "PROT", "ALBUMIN " in the last 168 hours.  No results for input(s): "LIPASE", "AMYLASE" in the last 168 hours. Recent Labs  Lab 05/08/24 1458 05/15/24 0424  AMMONIA 29 22    ABG No results found for: "PHART", "PCO2ART", "PO2ART", "HCO3", "TCO2", "ACIDBASEDEF", "O2SAT"   Coagulation Profile: No results for input(s): "INR", "PROTIME" in the last 168 hours.   Cardiac Enzymes: Recent Labs  Lab 05/11/24 0327 05/14/24 1809  CKTOTAL 540* 103    HbA1C: Hgb A1c MFr Bld  Date/Time Value Ref Range Status  04/16/2024 01:24 PM 7.5 (H) 4.8 - 5.6 % Final    Comment:    (NOTE) Pre diabetes:          5.7%-6.4%  Diabetes:              >6.4%  Glycemic control for   <7.0% adults with diabetes     CBG: Recent Labs  Lab 05/15/24 0352 05/15/24 0509 05/15/24 0611 05/15/24 0727 05/15/24 0828  GLUCAP 264* 216* 207* 205* 204*     Review of Systems:   Too confused to answer  Past Medical History:  He,  has a past medical history of Anxiety, Arthritis, BPH (benign prostatic hyperplasia), Depression, Diabetes (HCC), Diabetic neuropathy (HCC), Diabetic retinopathy (HCC), Esophageal stricture, Fibromyalgia, History of kidney stones, Hyperlipidemia, Hypertension, Hypothyroidism, Melanoma (HCC), OSA (obstructive sleep apnea), Polyneuropathy, and Tremor.   Surgical History:   Past Surgical History:  Procedure Laterality Date   ANTERIOR CERVICAL DECOMP/DISCECTOMY FUSION N/A 05/01/2024   Procedure: ANTERIOR CERVICAL DECOMPRESSION/DISCECTOMY FUSION  Cervical Five-Cervical Six, remove tether plate Cervical Six-Seven;  Surgeon: Joaquin Mulberry, MD;  Location: [facility] OR;  Service: Neurosurgery;  Laterality: N/A;  ACDF - C3-C4 - C4-C5 - C5-C6, remove tether plate Z3-0  CARPAL TUNNEL RELEASE Bilateral 2002   CATARACT EXTRACTION     CERVICAL DISC SURGERY  2005   CYST EXCISION  2002   middle finger   CYST EXCISION  2007   left thigh   ESOPHAGEAL DILATION     ROTATOR CUFF REPAIR Left    TRIGGER FINGER RELEASE Bilateral      Social History:   reports that he has never smoked. He quit smokeless tobacco use about 20 years ago. He reports that he does not currently use alcohol. He reports that he does not use drugs.   Family History:  His family history includes CAD in his father; COPD in his father; Healthy in his son; Other in his brother; Thyroid disease in his mother; Tremor in his mother.   Allergies Allergies  Allergen Reactions   Other Other (See Comments), Shortness Of Breath and Swelling    Flu vaccine   Latex Rash    Adhesive      Home Medications  Prior to Admission medications   Medication Sig Start Date End Date Taking? Authorizing Provider  albuterol  (VENTOLIN  HFA) 108 (90 Base) MCG/ACT inhaler Inhale 2 puffs into the lungs every 4 (four) hours as needed for wheezing or shortness of breath. 08/21/20  Yes  Byrum, Delora Ferry, MD  amitriptyline  (ELAVIL ) 50 MG tablet Take 50 mg by mouth at bedtime. 01/02/17  Yes [provider]  Ascorbic Acid (VITAMIN C) 1000 MG tablet Take 1,000 mg by mouth daily.   Yes [provider]  atenolol  (TENORMIN ) 50 MG tablet Take 50-100 mg by mouth 2 (two) times daily. Take 2 tablets (100 mg) in the morning & Take 1 tablet (50 mg) at bedtime 12/13/16  Yes [provider]  buPROPion  (WELLBUTRIN  SR) 150 MG 12 hr tablet Take 150 mg by mouth 2 (two) times daily.  12/16/16  Yes [provider]  busPIRone  (BUSPAR ) 10 MG tablet Take 10 mg by mouth 2 (two) times daily. 11/14/16  Yes [provider]  ezetimibe  (ZETIA ) 10 MG tablet Take 10 mg by mouth daily. 02/26/24  Yes [provider]  gabapentin  (NEURONTIN ) 300 MG capsule Take 300 mg by mouth 2 (two) times daily.   Yes [provider]  Insulin  NPH, Human,, Isophane, (NOVOLIN N FLEXPEN) 100 UNIT/ML Kiwkpen Inject 0-25 Units into the skin 2 (two) times daily as needed.   Yes [provider]  insulin  regular (NOVOLIN R) 100 units/mL injection Inject 0-20 Units into the skin 3 (three) times daily as needed for high blood sugar.   Yes [provider]  latanoprost  (XALATAN ) 0.005 % ophthalmic solution Place 1 drop into both eyes at bedtime.   Yes [provider]  levothyroxine  (SYNTHROID ) 112 MCG tablet Take 112 mcg by mouth 2 (two) times daily. 12/23/16  Yes [provider]  omeprazole  (PRILOSEC) 40 MG capsule TAKE ONE CAPSULE BY MOUTH EVERY MORNING AND EVERY EVENING 10/04/21  Yes Byrum, Delora Ferry, MD  ondansetron  (ZOFRAN ) 4 MG tablet Take 4 mg by mouth every 8 (eight) hours as needed for nausea or vomiting. 05/07/24  Yes [provider]  rosuvastatin  (CRESTOR ) 10 MG tablet Take 10 mg by mouth 3 (three) times a week. 10/12/16  Yes [provider]  traMADol (ULTRAM) 50 MG tablet Take 50 mg by mouth 3 (three) times daily. 10/17/16   Yes [provider]  vitamin B-12 (CYANOCOBALAMIN) 1000 MCG tablet Take 1,000 mcg by mouth daily.   Yes [provider]  HYDROcodone -acetaminophen  (NORCO) 10-325  MG tablet Take 1 tablet by mouth every 4 (four) hours as needed for moderate pain (pain score 4-6). 05/01/24   Joaquin Mulberry, MD  tiZANidine  (ZANAFLEX ) 4 MG tablet Take 1 tablet (4 mg total) by mouth every 8 (eight) hours as needed for muscle spasms. 05/01/24   Joaquin Mulberry, MD     Critical care time: 35 mins    Christian Fatima Fedie AGACNP-BC   [facility] 05/15/2024, 9:20 AM  Please see [email or web address] for pager details.  From 7A-7P if no response, please call [phone]. After hours, please call ELink [phone].

## 2024-05-15 NOTE — Progress Notes (Signed)
 Myself and several other Rns attempted to advance coretrack for medication and enteral nutrition needs. PT unable to tolerate multiple attempts at placing coretrack. Both nares were attempted. PT chokes, gags and coughs almost continuously and was unable to follow commands to swallow. PT had no adverse reaction to attempts. Provider notified, orders placed for IR placement.

## 2024-05-15 NOTE — Inpatient Diabetes Management (Signed)
 Inpatient Diabetes Program Recommendations  AACE/ADA: New Consensus Statement on Inpatient Glycemic Control (2015)  Target Ranges:  Prepandial:   less than 140 mg/dL      Peak postprandial:   less than 180 mg/dL (1-2 hours)      Critically ill patients:  140 - 180 mg/dL   Lab Results  Component Value Date   GLUCAP 207 (H) 05/15/2024   HGBA1C 7.5 (H) 04/16/2024    Review of Glycemic Control  Diabetes history: DM1 Outpatient Diabetes medications: Outpatient Diabetes medications:  Medtronic 780 with Dexcom G7 Fiasp  Basal-2.95/hr (70.8) ICR-10 Target-90-140 Current orders for Inpatient glycemic control: IV insulin  per [facility] for DKA  HgbA1C - 7.5% - good glycemic control PTA Endo: Dr Jesse Moritz  Inpatient Diabetes Program Recommendations:    Agree with orders.   Continue insulin  drip until criteria met for discontinuation. Give Semglee  1-2 hours prior to discontinuing IV insulin  drip.  Transition orders:  Semglee  25 units BID  Novolog  0-9 units TID with meals and 0-5 HS  Novolog  5 units TID with meals if eating > 50%  Patient is followed by Dr Jesse Moritz for T1DM and following surgery patient has not been appropriate for insulin  pump. Wife has been giving injections based on blood sugars (NPH ~20 units BID and then R with meals).   Will continue to follow closely.  Thank you. Joni Net, RD, LDN, CDCES Inpatient Diabetes Coordinator [phone]

## 2024-05-15 NOTE — Evaluation (Signed)
 Physical Therapy Evaluation Patient Details Name: Willie [name] MRN: [id number] DOB: 17-May-1961 Today's Date: 05/15/2024  History of Present Illness  63 y.o. male presenting to [facility] with AMS on5/13/2025. S/p ACDF 05/01/2024 developing hallucinations and confusion. W/u pending for dementia, r/o PNA.   PMH includes but is not limited to:  cognitive difficulties, possible MCI at baseline, anxiety, depression, diabetes, diabetic nephropathy and retinopathy, fibromyalgia, hypertension, hyperlipidemia, sleep apnea, documented history of essential tremor  Clinical Impression    Pt admitted with above diagnosis.  Pt currently with functional limitations due to the deficits listed below (see PT Problem List). Pt in bed when therapist arrived. Pt no longer exhibiting perceived behaviors including agitation nor combative. Pt is oriented to self and is able to recall spouse's name, pt is able to intermittently follow one step commands with increased time for response and or motor processing and planning. Pt demonstrates limited ability to initiate movement at this time and presents with generalized weakness B UE and LE with noted stiffness with ROM. Pt required max A x 2 for supine <> sit, initial sitting balance EOB with max A due to strong posterior lean and pt able to progress with cues for trunk flexion and facilitation for B LE placement to CGA, pt required min A x 2 with HHA and B knees blocked to stand, standing balance with min A x 2 and use of hospital bed for up to 7 minutes. Pt returned to bed due to anticipated procedure, all needs in place and nurse present. Patient will benefit from continued inpatient follow up therapy, <3 hours/day. Pt will benefit from acute skilled PT to increase their independence and safety with mobility to allow discharge.         If plan is discharge home, recommend the following: Two people to help with walking and/or transfers;Two people to help with  bathing/dressing/bathroom;Assistance with feeding;Assistance with cooking/housework;Direct supervision/assist for medications management;Direct supervision/assist for financial management;Assist for transportation;Help with stairs or ramp for entrance;Supervision due to cognitive status   Can travel by private vehicle   No    Equipment Recommendations Other (comment) (TBD)  Recommendations for Other Services       Functional Status Assessment Patient has had a recent decline in their functional status and demonstrates the ability to make significant improvements in function in a reasonable and predictable amount of time.     Precautions / Restrictions Precautions Precautions: Fall;Cervical Precaution Booklet Issued: Yes (comment) Recall of Precautions/Restrictions: Impaired Precaution/Restrictions Comments: Reviewed C spine precautions with pt and spouse Required Braces or Orthoses: Other Brace Other Brace: no brace needed- soft collar brace ordered and donned as pt needs reminders for precautions. Restrictions Weight Bearing Restrictions Per Provider Order: No      Mobility  Bed Mobility Overal bed mobility: Needs Assistance Bed Mobility: Sidelying to Sit, Sit to Supine   Sidelying to sit: Supervision, Max assist, +2 for physical assistance, +2 for safety/equipment, HOB elevated     Sit to sidelying: Max assist, +2 for physical assistance, +2 for safety/equipment, HOB elevated General bed mobility comments: therapist facilitated log roll, and placement of R hand on bed rail to engage, pt poor grasp, generalized weakness, stiffness and motor control limiting pt ability to engage with supine <> sit    Transfers Overall transfer level: Needs assistance Equipment used: 2 person hand held assist Transfers: Sit to/from Stand Sit to Stand: Min assist, +2 safety/equipment, +2 physical assistance, From elevated surface  General transfer comment: min cues, B knees  blocked and heavy reliance on posterior B LE on bed for support once in standing    Ambulation/Gait Ambulation/Gait assistance: Max assist, +2 physical assistance, +2 safety/equipment   Assistive device: 2 person hand held assist         General Gait Details: pt required max A x 2 with strong cues, facilitation for weight shifting and LE placement to side step to the R x 3 feet  Stairs            Wheelchair Mobility     Tilt Bed    Modified Rankin (Stroke Patients Only)       Balance Overall balance assessment: Needs assistance Sitting-balance support: No upper extremity supported, Feet supported Sitting balance-Leahy Scale: Fair     Standing balance support: During functional activity Standing balance-Leahy Scale: Poor Standing balance comment: B UE support with HHA x 2 and min A with posterior lean and use of hospital bed distal posterior B LE for support with pt use of Y ligaments for support                             Pertinent Vitals/Pain Pain Assessment Pain Assessment: No/denies pain (pt denies pain and no apparent pain response with functional mobiltiy tasks)    Home Living Family/patient expects to be discharged to:: Private residence Living Arrangements: Spouse/significant other Available Help at Discharge: Family;Available 24 hours/day Type of Home: House Home Access: Stairs to enter   [employer] of Steps: 2 through garage Alternate Level Stairs-Number of Steps: flight + landing + 3 Home Layout: Two level;1/2 bath on main level;Bed/bath upstairs;Other (Comment) (recliner to sleep on main level) Home Equipment: Shower seat;Hand held shower head Additional Comments: Pt spouse notes that pt has had 3 witnessed falls and some possible slips in the bedroom prior to amdission to [facility] for cervical surgery. pt is a poor historian and unable to provide insight to PLOF at time of eval 5/21 and information obtained from prior admission     Prior Function Prior Level of Function : History of Falls (last six months);Needs assist             Mobility Comments: Pt spouse reports recently having to physically assist pt to transfer from recliner ADLs Comments: Sposue assists with bathing/dressing and iADLs     Extremity/Trunk Assessment        Lower Extremity Assessment Lower Extremity Assessment: Generalized weakness (noted stiffness B UE and LE with poor motor contol and coordination and difficulty initiating movements)    Cervical / Trunk Assessment Cervical / Trunk Assessment: Neck Surgery  Communication   Communication Communication: No apparent difficulties (increased time and limited verbal response)    Cognition Arousal: Alert Behavior During Therapy: Restless, Flat affect   PT - Cognitive impairments: Orientation, Awareness, Attention, Problem solving, Safety/Judgement   Orientation impairments: Person                     Following commands: Impaired Following commands impaired: Follows one step commands with increased time, Follows multi-step commands inconsistently     Cueing Cueing Techniques: Verbal cues, Gestural cues, Tactile cues, Visual cues     General Comments General comments (skin integrity, edema, etc.): HR elevated 121 when in standing, Bp 165/49    Exercises     Assessment/Plan    PT Assessment Patient needs continued PT services  PT Problem List Decreased strength;Decreased  range of motion;Decreased activity tolerance;Decreased mobility;Decreased balance;Decreased coordination;Decreased cognition;Decreased safety awareness;Decreased knowledge of precautions;Cardiopulmonary status limiting activity       PT Treatment Interventions DME instruction;Gait training;Functional mobility training;Therapeutic activities;Therapeutic exercise;Balance training;Neuromuscular re-education;Cognitive remediation;Patient/family education    PT Goals (Current goals can be found in the  Care Plan section)  Acute Rehab PT Goals PT Goal Formulation: Patient unable to participate in goal setting    Frequency Min 2X/week     Co-evaluation               AM-PAC PT "6 Clicks" Mobility  Outcome Measure Help needed turning from your back to your side while in a flat bed without using bedrails?: A Lot Help needed moving from lying on your back to sitting on the side of a flat bed without using bedrails?: A Lot Help needed moving to and from a bed to a chair (including a wheelchair)?: A Lot Help needed standing up from a chair using your arms (e.g., wheelchair or bedside chair)?: A Lot Help needed to walk in hospital room?: Total Help needed climbing 3-5 steps with a railing? : Total 6 Click Score: 10    End of Session   Activity Tolerance: Patient tolerated treatment well;Patient limited by fatigue Patient left: in bed;with call bell/phone within reach;with nursing/sitter in room Nurse Communication: Mobility status PT Visit Diagnosis: Unsteadiness on feet (R26.81);Other abnormalities of gait and mobility (R26.89);Muscle weakness (generalized) (M62.81);History of falling (Z91.81);Difficulty in walking, not elsewhere classified (R26.2)    Time: 1610-9604 PT Time Calculation (min) (ACUTE ONLY): 33 min   Charges:   PT Evaluation $PT Eval Moderate Complexity: 1 Mod   PT General Charges $$ ACUTE PT VISIT: 1 Visit         Cary Clarks, PT Acute Rehab   Annalee Kiang 05/15/2024, 3:20 PM

## 2024-05-15 NOTE — Progress Notes (Signed)
 Nutrition Follow-up  DOCUMENTATION CODES:   Obesity unspecified  INTERVENTION:   Monitor magnesium, potassium, and phosphorus  for at least 3 days, MD to replete as needed, as pt is at risk for refeeding syndrome. -Continue 100 mg Thiamine   Once small bore NGT placed and placement verified: -Initiate Jevity 1.5 @ 20 ml/hr, advance by 10 ml every 12 hours to goal rate of 60 ml/hr. -60 ml Prosource TF 20 daily -Provides 2240 kcals, 111g protein and 1094 ml H2O  NUTRITION DIAGNOSIS:   Inadequate oral intake related to lethargy/confusion as evidenced by NPO status, energy intake < or equal to 50% for > or equal to 5 days.  GOAL:   Patient will meet greater than or equal to 90% of their needs  MONITOR:   Diet advancement, Weight trends  REASON FOR ASSESSMENT:   Consult Enteral/tube feeding initiation and management, Assessment of nutrition requirement/status  ASSESSMENT:   63 y.o. male with PMH significant of mild cognitive impairment, GAD, depression, chronic pain syndrome, reactive airway disease, insulin -dependent DM type II, HLD and cervical stenosis who underwent a spine fusion 05/01/2024 and presented to the ED 5/13 with worsening confusion.  5/13: Admit; NPO  5/19: FLD 5/20: NPO  Patient unable to have diet advanced given mentation. Pt unable to have Cortrak placed today, unsuccessful at bedside. IR has now been consulted for small bore NGT placement.  RD received consult for TF initiation. Will leave recommendations until tube is placed and verified. Pt will be at refeeding risk given poor PO for 8 days now. Already on IV Thiamine.  Admission weight: 214 lbs Needs updated weight  Medications: Thiamine, Cleviprex (4 ml/hr ~192 kcals), D5-lactated ringers , KCl  Labs reviewed: CBGs: 161-206   Diet Order:   Diet Order             Diet NPO time specified  Diet effective now                   EDUCATION NEEDS:   Education needs have been  addressed  Skin:  Skin Assessment: Reviewed RN Assessment  Last BM:  5/20  Height:   Ht Readings from Last 1 Encounters:  05/07/24 5\' 9"  (1.753 m)    Weight:   Wt Readings from Last 1 Encounters:  05/07/24 97.1 kg    BMI:  Body mass index is 31.6 kg/m.  Estimated Nutritional Needs:   Kcal:  1950-2300 kcals  Protein:  100-115 grams  Fluid:  >/= 2L   Arna Better, MS, RD, LDN Inpatient Clinical Dietitian Contact via Secure chat

## 2024-05-15 NOTE — Evaluation (Signed)
 SLP Cancellation Note  Patient Details Name: Willie [name] MRN: [id number] DOB: 12-Jun-1961   Cancelled treatment:       Reason Eval/Treat Not Completed: Other (comment) (per secure chat with RN, pt remains too lethargic for po and plan is to place Cortrak today, RN also states pt coughs during sleep - ? due to tongue and/or saliva, will continue efforts.) Maudie Sorrow, MS [facility] SLP Acute Rehab Services Office [phone]   Chantal Comment 05/15/2024, 9:25 AM

## 2024-05-15 NOTE — Evaluation (Signed)
 Occupational Therapy Evaluation Patient Details Name: Willie [name] MRN: [id number] DOB: 03-05-1961 Today's Date: 05/15/2024   History of Present Illness   Patient 63 year old male presenting to [facility] with AMS on 05/07/2024. S/p ACDF 05/01/2024 developing hallucinations and confusion. W/u pending for dementia, r/o PNA.   PMH includes but is not limited to:  cognitive difficulties, possible MCI at baseline, anxiety, depression, diabetes, diabetic nephropathy and retinopathy, fibromyalgia, hypertension, hyperlipidemia, sleep apnea, documented history of essential tremor     Clinical Impressions Patient is a 63 year old male who was admitted for above. Patient was living at home with wife with recent ACDF surgery. Patient is +2 for movement in bed with patient having difficulty with initiation, sequencing and processing. Patient was noted to have decreased functional activity tolernace, decreased ROM, decreased BUE strength, decreased endurance, decreased sitting balance, decreased standing balanced, decreased safety awareness, and decreased knowledge of AE/AD impacting participation in ADLs. Patient would continue to benefit from skilled OT services at this time while admitted and after d/c to address noted deficits in order to improve overall safety and independence in ADLs. Patient will benefit from continued inpatient follow up therapy, <3 hours/day.       If plan is discharge home, recommend the following:   Direct supervision/assist for financial management;Supervision due to cognitive status;Direct supervision/assist for medications management;A lot of help with bathing/dressing/bathroom;Assistance with cooking/housework;Assist for transportation     Functional Status Assessment   Patient has had a recent decline in their functional status and demonstrates the ability to make significant improvements in function in a reasonable and predictable amount of time.     Equipment  Recommendations   None recommended by OT      Precautions/Restrictions   Precautions Precautions: Fall;Cervical Precaution Booklet Issued: Yes (comment) Recall of Precautions/Restrictions: Impaired Required Braces or Orthoses: Other Brace Other Brace: no brace needed- soft collar brace ordered since last hospitalization Restrictions Weight Bearing Restrictions Per Provider Order: No     Mobility Bed Mobility Overal bed mobility: Needs Assistance Bed Mobility: Sidelying to Sit, Sit to Sidelying   Sidelying to sit: Max assist, +2 for physical assistance, +2 for safety/equipment, HOB elevated     Sit to sidelying: Max assist, +2 for physical assistance, +2 for safety/equipment, HOB elevated General bed mobility comments: therapist facilitated log roll, and placement of R hand on bed rail to engage, pt poor grasp, generalized weakness, stiffness and motor control limiting pt ability to engage with supine <> sit          Balance Overall balance assessment: Needs assistance Sitting-balance support: No upper extremity supported, Feet supported Sitting balance-Leahy Scale: Fair     Standing balance support: During functional activity Standing balance-Leahy Scale: Poor Standing balance comment: B UE support with HHA x 2 and min A with posterior lean and use of hospital bed distal posterior B LE for support with pt use of Y ligaments for support       ADL either performed or assessed with clinical judgement   ADL Overall ADL's : Needs assistance/impaired Eating/Feeding: NPO   Grooming: Sitting;Maximal assistance   Upper Body Bathing: Bed level;Maximal assistance   Lower Body Bathing: Bed level;Total assistance   Upper Body Dressing : Bed level;Maximal assistance   Lower Body Dressing: Bed level;Total assistance   Toilet Transfer: +2 for safety/equipment;+2 for physical assistance Toilet Transfer Details (indicate cue type and reason): +2 min A for sit to stand.  paitent was max A+2 for attempting to take steps  on EOB with patient unable to participate in movement of BLE withphysical A needed for weight shifting and to advance LE. Toileting- Clothing Manipulation and Hygiene: Bed level;Total assistance               Vision   Vision Assessment?: No apparent visual deficits Additional Comments: difficult to assess            Pertinent Vitals/Pain Pain Assessment Pain Assessment: No/denies pain     Extremity/Trunk Assessment Upper Extremity Assessment Upper Extremity Assessment: Right hand dominant;Difficult to assess due to impaired cognition;RUE deficits/detail;LUE deficits/detail RUE Deficits / Details: limited ROM assessment secondary to recent ACDF did not PROM past 90 degrees. able to squeeze hand on this side when asked RUE Coordination: decreased gross motor;decreased fine motor LUE Deficits / Details: limited ROM assessment secondary to recent ACDF did not PROM past 90 degrees. una le to folow cues to use this UE. noted to later in session be able to scratch nose with this UE with no ROM deficits. LUE Coordination: decreased gross motor;decreased fine motor   Lower Extremity Assessment Lower Extremity Assessment: Defer to PT evaluation   Cervical / Trunk Assessment Cervical / Trunk Assessment: Neck Surgery (recent ACDF)   Communication Communication Communication: No apparent difficulties (increased time to respond)   Cognition Arousal: Alert Behavior During Therapy: Restless, Flat affect Cognition: Cognition impaired     Awareness: Online awareness impaired, Intellectual awareness impaired Memory impairment (select all impairments): Short-term memory, Working Biochemist, clinical functioning impairment (select all impairments): Reasoning, Problem solving, Initiation, Organization, Sequencing OT - Cognition Comments: Patient with poor awarenes of deficits. able to engage some in session increased time to process all cues. no  initation. knew his name, not birthdate, knew wifes name.                 Following commands: Impaired Following commands impaired: Follows one step commands with increased time     Cueing  General Comments   Cueing Techniques: Verbal cues;Gestural cues;Tactile cues;Visual cues  HR elevated 121 when in standing, Bp 165/49           Home Living Family/patient expects to be discharged to:: Private residence Living Arrangements: Spouse/significant other Available Help at Discharge: Family;Available 24 hours/day Type of Home: House Home Access: Stairs to enter [employer] of Steps: 2 through garage   Home Layout: Two level;1/2 bath on main level;Bed/bath upstairs;Other (Comment) Alternate Level Stairs-Number of Steps: flight + landing + 3 Alternate Level Stairs-Rails: Right Bathroom Shower/Tub: Producer, television/film/video: Standard     Home Equipment: Shower seat;Hand held shower head   Additional Comments: Pt spouse notes that pt has had 3 witnessed falls and some possible slips in the bedroom prior to amdission to [facility] for cervical surgery. pt is a poor historian and unable to provide insight to PLOF at time of eval 5/21 and information obtained from prior admission      Prior Functioning/Environment Prior Level of Function : History of Falls (last six months);Needs assist             Mobility Comments: Pt spouse reports recently having to physically assist pt to transfer from recliner ADLs Comments: Sposue assists with bathing/dressing and iADLs    OT Problem List: Decreased strength;Impaired balance (sitting and/or standing);Decreased safety awareness;Decreased knowledge of precautions;Pain;Decreased cognition;Decreased knowledge of use of DME or AE   OT Treatment/Interventions: Self-care/ADL training;Therapeutic exercise;Patient/family education;Balance training;Therapeutic activities;DME and/or AE instruction      OT Goals(Current  goals  can be found in the care plan section)   Acute Rehab OT Goals Patient Stated Goal: none stated OT Goal Formulation: Patient unable to participate in goal setting Time For Goal Achievement: 05/29/24 Potential to Achieve Goals: Fair   OT Frequency:  Min 2X/week    Co-evaluation PT/OT/SLP Co-Evaluation/Treatment: Yes Reason for Co-Treatment: For patient/therapist safety PT goals addressed during session: Mobility/safety with mobility OT goals addressed during session: ADL's and self-care      AM-PAC OT "6 Clicks" Daily Activity     Outcome Measure Help from another person eating meals?: Total (NPO) Help from another person taking care of personal grooming?: Total Help from another person toileting, which includes using toliet, bedpan, or urinal?: Total Help from another person bathing (including washing, rinsing, drying)?: Total Help from another person to put on and taking off regular upper body clothing?: Total Help from another person to put on and taking off regular lower body clothing?: Total 6 Click Score: 6   End of Session Equipment Utilized During Treatment: Gait belt Nurse Communication: Mobility status  Activity Tolerance: Patient tolerated treatment well Patient left: in bed;with call bell/phone within reach;with nursing/sitter in room;with bed alarm set  OT Visit Diagnosis: Unsteadiness on feet (R26.81);Pain                Time: 2956-2130 OT Time Calculation (min): 33 min Charges:  OT General Charges $OT Visit: 1 Visit OT Evaluation $OT Eval Moderate Complexity: 1 Mod  Shakerria Parran OTR/L, MS Acute Rehabilitation Department Office# [phone]   Jame Maze 05/15/2024, 4:00 PM

## 2024-05-15 NOTE — Evaluation (Signed)
 SLP Cancellation Note  Patient Details Name: Willie [name] MRN: [id number] DOB: 05-13-1961   Cancelled treatment:       Reason Eval/Treat Not Completed: Other (comment)  Note pt without successful feeding tube placement.  SLP highly recommends an MBS to allow instrumental evaluation of anatomy/physiology of swallow given onset of dysphagia post-surgery.  Per RN, radiologist questions if tube unable to be placed due to inflammation.  Also noted results of neck CT.  Per critical care service, cervical spine MRI ordered. Will continue efforts.    Maudie Sorrow, MS [facility] SLP Acute Rehab Services Office [phone]  Chantal Comment 05/15/2024, 3:57 PM

## 2024-05-15 NOTE — Progress Notes (Signed)
 NAME:  Willie [name], MRN:  [id number], DOB:  July 26, 1961, LOS: 7 ADMISSION DATE:  05/07/2024, CONSULTATION DATE:  05/14/24 REFERRING MD:  Thelma Fire, CHIEF COMPLAINT:  AMS   History of Present Illness:  63 year old man w/ hx of cognitive impairment, DM1, depression, chronic pain, DM2, recent C spine fusion who is presenting with worsening confusion after the C spine fusion.  Workup reveals some combination of DKA, polypharmacy, postop delirium or potential Lewy Body Dementia.   Patient has had difficult-to-control agitation requiring restraints and multiple PRNs. Today he is more somnolent and unable to take PO, his Bps have been rising with PRN IVPs unable to control it.  Other issue has been nocturnal desaturations with question of OSA.  PCCM consulted for BP management and need for CCB gtt.  Pertinent  Medical History   Past Medical History:  Diagnosis Date   Anxiety    Arthritis    BPH (benign prostatic hyperplasia)    Depression    Diabetes (HCC)    Diabetic neuropathy (HCC)    Diabetic retinopathy (HCC)    Esophageal stricture    Fibromyalgia    History of kidney stones    Hyperlipidemia    Hypertension    Hypothyroidism    Melanoma (HCC)    OSA (obstructive sleep apnea)    Polyneuropathy    Tremor      Significant Hospital Events: Including procedures, antibiotic start and stop dates in addition to other pertinent events   5/13 admit 5/20 PCCM consult 5/22 Insulin  gtt started due to concerns of going back into DKA, weaning down cleviprex gtt   Interim History / Subjective:  No acute distress, following commands, alert- oriented to self, disoriented x 3 Ketone breath noted   Objective    Blood pressure (!) 146/53, pulse (!) 118, temperature 100.1 F (37.8 C), temperature source Axillary, resp. rate 19, height 5\' 9"  (1.753 m), weight 97.1 kg, SpO2 100%.        Intake/Output Summary (Last 24 hours) at 05/15/2024 0853 Last data filed at 05/15/2024 6213 Gross per 24  hour  Intake 5453.64 ml  Output 675 ml  Net 4778.64 ml   Filed Weights   05/07/24 1602  Weight: 97.1 kg    Examination: General: acute on chronic adult male, lying in icu, NAD  HEENT: Normocephalic, PERRLA intact, Pink  CV: s1,s2, RRR, no MRG, No JVD  pulm: clear, diminished, no distress  Abs: bs active, soft  Extremities: no edema, no deformity, moves all extremities on command Skin: rash from lead adhesive, continue to monitor  Neuro: Rass 0, follows commands, oriented to self, disoriented x 3  GU: foley (exchanged on 5/20)   Resolved problem list   Assessment and Plan  Acute metabolic encephalopathy- waxing waning mental status most c/w delirium; has been excited agitation up until today.  Does not really fit any withdrawal syndrome. Urinary retention- foley in place, exchanged (5/20)  Fever Hypertension  IDDM1 Moderate sleep apnea- see Sood's PSG 2022; did not want to wear due to cost and compliance; would not worry about nocturnal dsats Background potential progressive dementia disorder Ammonia 22, beta hydroxy 2.16 >> 1.46 >> 0.64  Borderline DKA presentation- anion gap 15, CO2 WNL- insulin  gtt restarted on 5/20, Beta hydroxy elevated but trending down.  Leukocytosis 15.8 >> 16.7, procal 0.12  P:  Continue insulin  gtt for now, hopefully to be able to transition later this afternoon off gtt, recheck beta-hydroxy this afternoon, BMET q 4, continue to replace  K  Continue neuro checks and monitor mental status  Continue to monitor urine output per foley Continue ceftriaxone for now, trend WBC  Place cortrak to assist with nutrition and to give oral meds, and to start nutrition  Continue to wean off cleviprex, check lipid panel will be able to oral meds via cortrak  Continue to avoid sedating medications   Best Practice (right click and "Reselect all SmartList Selections" daily)   Diet/type: Regular consistency (see orders) DVT prophylaxis start lovenox Pressure  ulcer(s): per nursing GI prophylaxis: N/A Lines: N/A Foley:  Yes, and it is still needed Code Status:  full code Last date of multidisciplinary goals of care discussion [pending 5/21]   Critical care time: 35 mins    Christian Majesty Oehlert AGACNP-BC   [place] [facility] 05/15/2024, 9:30 AM  Please see [email or web address] for pager details.  From 7A-7P if no response, please call [phone]. After hours, please call ELink [phone].

## 2024-05-16 ENCOUNTER — Inpatient Hospital Stay (HOSPITAL_COMMUNITY)

## 2024-05-16 DIAGNOSIS — R509 Fever, unspecified: Secondary | ICD-10-CM | POA: Diagnosis not present

## 2024-05-16 DIAGNOSIS — I10 Essential (primary) hypertension: Secondary | ICD-10-CM | POA: Diagnosis not present

## 2024-05-16 DIAGNOSIS — G9341 Metabolic encephalopathy: Secondary | ICD-10-CM | POA: Diagnosis not present

## 2024-05-16 DIAGNOSIS — R339 Retention of urine, unspecified: Secondary | ICD-10-CM | POA: Diagnosis not present

## 2024-05-16 LAB — BASIC METABOLIC PANEL WITH GFR
Anion gap: 11 (ref 5–15)
BUN: 15 mg/dL (ref 8–23)
CO2: 22 mmol/L (ref 22–32)
Calcium: 8.2 mg/dL — ABNORMAL LOW (ref 8.9–10.3)
Chloride: 102 mmol/L (ref 98–111)
Creatinine, Ser: 0.56 mg/dL — ABNORMAL LOW (ref 0.61–1.24)
GFR, Estimated: >60 mL/min (ref >60)
Glucose, Bld: 233 mg/dL — ABNORMAL HIGH (ref 70–99)
Potassium: 3.6 mmol/L (ref 3.5–5.1)
Sodium: 135 mmol/L (ref 135–145)

## 2024-05-16 LAB — MAGNESIUM: Magnesium: 2.2 mg/dL (ref 1.7–2.4)

## 2024-05-16 LAB — CBC
HCT: 41 % (ref 39.0–52.0)
Hemoglobin: 13.8 g/dL (ref 13.0–17.0)
MCH: 29.8 pg (ref 26.0–34.0)
MCHC: 33.7 g/dL (ref 30.0–36.0)
MCV: 88.6 fL (ref 80.0–100.0)
Platelets: 439 10*3/uL — ABNORMAL HIGH (ref 150–400)
RBC: 4.63 MIL/uL (ref 4.22–5.81)
RDW: 12.5 % (ref 11.5–15.5)
WBC: 10.7 10*3/uL — ABNORMAL HIGH (ref 4.0–10.5)
nRBC: 0 % (ref 0.0–0.2)

## 2024-05-16 LAB — PHOSPHORUS: Phosphorus: 3.2 mg/dL (ref 2.5–4.6)

## 2024-05-16 LAB — GLUCOSE, CAPILLARY
Glucose-Capillary: 156 mg/dL — ABNORMAL HIGH (ref 70–99)
Glucose-Capillary: 168 mg/dL — ABNORMAL HIGH (ref 70–99)
Glucose-Capillary: 219 mg/dL — ABNORMAL HIGH (ref 70–99)
Glucose-Capillary: 233 mg/dL — ABNORMAL HIGH (ref 70–99)
Glucose-Capillary: 245 mg/dL — ABNORMAL HIGH (ref 70–99)
Glucose-Capillary: 272 mg/dL — ABNORMAL HIGH (ref 70–99)

## 2024-05-16 MED ORDER — OLANZAPINE 5 MG PO TBDP
5.0000 mg | ORAL_TABLET | Freq: Every day | ORAL | Status: DC
  Filled 2024-05-16 (×3): qty 1

## 2024-05-16 MED ORDER — ONDANSETRON HCL 4 MG/2ML IJ SOLN
4.0000 mg | Freq: Four times a day (QID) | INTRAMUSCULAR | Status: DC | PRN
  Administered 2024-05-17: 4 mg via INTRAVENOUS
  Filled 2024-05-16: qty 2

## 2024-05-16 MED ORDER — CARVEDILOL 12.5 MG PO TABS
12.5000 mg | ORAL_TABLET | Freq: Two times a day (BID) | ORAL | Status: DC
  Administered 2024-05-16 – 2024-05-22 (×12): 12.5 mg via ORAL
  Filled 2024-05-16 (×13): qty 1

## 2024-05-16 MED ORDER — LABETALOL HCL 5 MG/ML IV SOLN
10.0000 mg | INTRAVENOUS | Status: DC | PRN
  Administered 2024-05-16: 10 mg via INTRAVENOUS
  Filled 2024-05-16 (×2): qty 4

## 2024-05-16 MED ORDER — ACETAMINOPHEN 325 MG PO TABS
650.0000 mg | ORAL_TABLET | Freq: Four times a day (QID) | ORAL | Status: DC | PRN
  Administered 2024-05-18 – 2024-05-19 (×4): 650 mg via ORAL
  Filled 2024-05-16 (×4): qty 2

## 2024-05-16 MED ORDER — ONDANSETRON HCL 4 MG PO TABS
4.0000 mg | ORAL_TABLET | Freq: Four times a day (QID) | ORAL | Status: DC | PRN
  Administered 2024-05-18: 4 mg via ORAL
  Filled 2024-05-16: qty 1

## 2024-05-16 MED ORDER — MELATONIN 3 MG PO TABS
3.0000 mg | ORAL_TABLET | Freq: Every day | ORAL | Status: DC
  Filled 2024-05-16: qty 1

## 2024-05-16 MED ORDER — GLUCERNA SHAKE PO LIQD
237.0000 mL | Freq: Three times a day (TID) | ORAL | Status: DC
  Filled 2024-05-16 (×4): qty 237

## 2024-05-16 NOTE — Progress Notes (Signed)
 eLink Physician-Brief Progress Note Patient Name: Willie [name] DOB: Oct 22, 1961 MRN: [id number]   Date of Service  05/16/2024  HPI/Events of Note  Hypertensive despite hydralazine, goal SBP less than 160.  Currently 179/66.  Previously on clevidipine  eICU Interventions  Maintain hydralazine as first-line, labetalol as second line, discontinue clevidipine if we can control blood pressures with push dose medications     Intervention Category Intermediate Interventions: Hypertension - evaluation and management  Atul Delucia 05/16/2024, 4:12 AM

## 2024-05-16 NOTE — Progress Notes (Signed)
 NAME:  Willie [name], MRN:  [id number], DOB:  1961/05/29, LOS: 8 ADMISSION DATE:  05/07/2024, CONSULTATION DATE:  05/14/24 REFERRING MD:  Thelma Fire, CHIEF COMPLAINT:  AMS   History of Present Illness:  63 year old man w/ hx of cognitive impairment, DM1, depression, chronic pain, DM2, recent C spine fusion who is presenting with worsening confusion after the C spine fusion.  Workup reveals some combination of DKA, polypharmacy, postop delirium or potential Lewy Body Dementia.   Patient has had difficult-to-control agitation requiring restraints and multiple PRNs. Today he is more somnolent and unable to take PO, his Bps have been rising with PRN IVPs unable to control it.  Other issue has been nocturnal desaturations with question of OSA.  PCCM consulted for BP management and need for CCB gtt.  Pertinent  Medical History   Past Medical History:  Diagnosis Date   Anxiety    Arthritis    BPH (benign prostatic hyperplasia)    Depression    Diabetes (HCC)    Diabetic neuropathy (HCC)    Diabetic retinopathy (HCC)    Esophageal stricture    Fibromyalgia    History of kidney stones    Hyperlipidemia    Hypertension    Hypothyroidism    Melanoma (HCC)    OSA (obstructive sleep apnea)    Polyneuropathy    Tremor      Significant Hospital Events: Including procedures, antibiotic start and stop dates in addition to other pertinent events   5/13 admit 5/20 PCCM consult 5/22 Insulin  gtt started due to concerns of going back into DKA, weaning down cleviprex gtt   Interim History / Subjective:  No acute distress, following commands, alert- oriented to self, disoriented x 3 Ketone breath noted   Objective    Blood pressure (!) 146/53, pulse (!) 118, temperature 100.1 F (37.8 C), temperature source Axillary, resp. rate 19, height 5\' 9"  (1.753 m), weight 97.1 kg, SpO2 100%.        Intake/Output Summary (Last 24 hours) at 05/15/2024 0853 Last data filed at 05/15/2024 4098 Gross per 24  hour  Intake 5453.64 ml  Output 675 ml  Net 4778.64 ml   Filed Weights   05/07/24 1602  Weight: 97.1 kg    Examination: General: acute on chronic adult male, sitting up in chair in ICU, NAD  HEENT: Normocephalic, PERRLA intact, Pink MM CV: s1,s2, RRR, no MRG, No JVD  pulm: clear, diminished, no distress on vent  Abs: bs active, soft  Extremities: no edema, no deformity, moves all extremities on command  Skin: no rash  Neuro: Rass 0, follows commands, alert and oriented x3, disoriented to situation  GU: foley (exchanged on 5/20)   Resolved problem list   Assessment and Plan  Acute metabolic encephalopathy- waxing waning mental status most c/w delirium; has been excited agitation up until today.  Does not really fit any withdrawal syndrome. Urinary retention- foley in place, exchanged (5/20)  Fever Hypertension  IDDM1 Moderate sleep apnea- see Sood's PSG 2022; did not want to wear due to cost and compliance; would not worry about nocturnal dsats Background potential progressive dementia disorder Ammonia 22, beta hydroxy 2.16 >> 1.46 >> 0.64  Borderline DKA presentation- anion gap 15, CO2 WNL-  insulin  gtt restarted on 5/20, Beta hydroxy elevated but trending down, transitioned off insulin  gtt on 5/21.  Leukocytosis 15.8 >> 16.7, procal 0.12  P:  Off insulin  gtt, Continue Resistant SSI, continue semeglee  Diabetes Coordinator consult  Repeat CBC to trend  WBC  Continue ceftriaxone  Continue oral Coreg  and clonidine  patch   Continue PRN hydralazine  Continue delirium precautions Transfer to progressive  TOC consult for CIR  Continue PT/OT eval, mobilize to chair daily   Best Practice (right click and "Reselect all SmartList Selections" daily)   Diet/type: Regular consistency (see orders) DVT prophylaxis start lovenox Pressure ulcer(s): per nursing GI prophylaxis: N/A Lines: N/A Foley:  Yes, and it is still needed Code Status:  full code Last date of  multidisciplinary goals of care discussion [updated wife and patient at beside 5/22]    Critical care time: 35 mins    Christian Kylah Maresh AGACNP-BC   [place] [facility] 05/16/2024, 10:38 AM  Please see [email or web address] for pager details.  From 7A-7P if no response, please call [phone]. After hours, please call ELink [phone].

## 2024-05-16 NOTE — Progress Notes (Addendum)
  Inpatient Rehab Admissions Coordinator :  Per Dr Shirlie Dove inquiry, patient was screened for CIR candidacy by Jeannetta Millman RN MSN. Patient is not at a level to tolerate the intensity required to pursue a CIR admit based on therapy evaluations 5/21. The CIR admissions team will follow and monitor for progress and place a Rehab Consult order if felt to be appropriate. Other rehab venues should be pursued at this time. Please contact me with any questions.TOC made aware.  Jeannetta Millman RN MSN Admissions Coordinator [phone]

## 2024-05-16 NOTE — Inpatient Diabetes Management (Signed)
 Inpatient Diabetes Program Recommendations  AACE/ADA: New Consensus Statement on Inpatient Glycemic Control (2015)  Target Ranges:  Prepandial:   less than 140 mg/dL      Peak postprandial:   less than 180 mg/dL (1-2 hours)      Critically ill patients:  140 - 180 mg/dL   Lab Results  Component Value Date   GLUCAP 272 (H) 05/16/2024   HGBA1C 7.5 (H) 04/16/2024    Review of Glycemic Control  Latest Reference Range & Units 05/15/24 15:23 05/15/24 16:53 05/15/24 19:33 05/15/24 23:29 05/16/24 03:20 05/16/24 07:33 05/16/24 11:25  Glucose-Capillary 70 - 99 mg/dL 161 (H) 096 (H) 045 (H) 220 (H) 219 (H) 233 (H) 272 (H)   Diabetes history: DM 2 Outpatient Diabetes medications: NPH 0-25 units bid prn for elevated glucose, Regular insulin  0-20 units tid before meals Current orders for Inpatient glycemic control:  Semglee  25 units bid Novolog  0-20 units Q4 hours Glucerna tid between meals (20 grams of carbohydrates)  Inpatient Diabetes Program Recommendations:    -   Add Novolog  5 units tid meal coverage if eating >50% of meals  Thanks,  Eloise Hake RN, MSN, BC-ADM Inpatient Diabetes Coordinator Team Pager [phone] (8a-5p)

## 2024-05-16 NOTE — Plan of Care (Signed)
  Problem: Fluid Volume: Goal: Ability to maintain a balanced intake and output will improve Outcome: Not Progressing   Problem: Metabolic: Goal: Ability to maintain appropriate glucose levels will improve Outcome: Not Progressing   Problem: Nutritional: Goal: Maintenance of adequate nutrition will improve Outcome: Not Progressing   Problem: Cardiac: Goal: Ability to maintain an adequate cardiac output will improve Outcome: Not Progressing   Problem: Metabolic: Goal: Ability to maintain appropriate glucose levels will improve Outcome: Not Progressing

## 2024-05-16 NOTE — TOC Progression Note (Signed)
 Transition of Care [facility]) - Progression Note    Patient Details  Name: Willie [name] MRN: [id number] Date of Birth: [birth year]  [facility]) CM/SW Contact  Levie Ream, RN Phone Number: 05/16/2024, 3:37 PM  Clinical Narrative:    Received [place] MUST ID # [id number]; awaiting level II PASRR.  Expected Discharge Plan: Skilled Nursing Facility Barriers to Discharge: Continued Medical Work up  Expected Discharge Plan and Services   Discharge Planning Services: CM Consult   Living arrangements for the past 2 months: Single Family Home                                       Social Determinants of Health (SDOH) Interventions SDOH Screenings   Food Insecurity: No Food Insecurity (05/16/2024)  Housing: Low Risk  (05/16/2024)  Transportation Needs: No Transportation Needs (05/16/2024)  Utilities: Not At Risk (05/16/2024)  Tobacco Use: Medium Risk (05/08/2024)    Readmission Risk Interventions    05/16/2024    2:42 PM  Readmission Risk Prevention Plan  Transportation Screening Complete  PCP or Specialist Appt within 5-7 Days Complete  Home Care Screening Complete  Medication Review (RN CM) Complete

## 2024-05-16 NOTE — Procedures (Addendum)
 Modified Barium Swallow Study  Patient Details  Name: Willie [name] MRN: [id number] Date of Birth: 02/17/1961  Today's Date: 05/16/2024  Modified Barium Swallow completed.  Full report located under Chart Review in the Imaging Section.  History of Present Illness Patient is a 63 y.o. male with PMH: cognitive impairment (undiagnosed but spouse suspects Lewy body dementia), depression, GAD, chronic pain syndrome, h/o gait instability, reactive airway disease, IDDM-2, hypothyroidism, HLD, cervical stenosis. He underwent ACDF surgery 05/01/24. He presented to the hospital on 05/08/24 with increasing confusion and hallucinations  which seems to get worse at night. His spouse reported that he has had worsening choking of food that occured even prior to ACDF surgery. He has been NPO and swallow evaluation has not been able to be completed due to patient with significant agitation, confusion, requiring 4-point restraints. CT cervical spine showed small amount of patchy, ground glass disease of left upper lobe apex, could be due to PNA, edema or scarring.  Initially was placed on full liquid nectar thick diet but this was changed to n.p.o.  NG tube placement attempted yesterday in IR but was unable to be transited into esophagus.   Clinical Impression Patient with minimal dysphagia with primarily decreased tongue base retraction/epiglottic deflection =- ? Due to edema.  No aspiration and possibly only scant penetration x1 of thin noted.  Slight delay in oral transiting most notably with liquids with patient benefiting from verbal cues at times to swallow at times.  Pharyngeal swallow notable for mild vallecular more than piriform sinus retention (due to decreased tongue base retraction with impaired epiglottic deflection) without patient awareness.  Cued expectoration not effective to clear vallecular retention however further intake did not significantly add to mild retention.       Patient notably with premature  spillage of pure mixed with secretions to the piriform sinus prior to swallow initiation.  And with initial bolus of pure.  He demonstrated gagging response propelling bolus that was aggregating at vallecular space into oral cavity.  He subsequently was able to swallow reflexively to clear.  Second pured bolus provided with cue for patient to conduct effortful swallow.  Patient demonstrated ability to gather bolus in oral cavity fully to rapidly propel entire bolus through pharynx.  This strategy also resulted in less retention.  Adequate rotary mastication noted with solid patient with functional pharyngeal clearance.       Patient was challenged with sequential boluses of thin without airway compromise but he does tend to feel his mouth full and allow premature spillage prior to swallow trigger.       Barium tablet provided with nectar thick liquid easily transited through oropharynx into esophagus.  Upon esophageal sweep patient appeared clear, MBS does not assess below the PES.  At this time recommend Dysphagia 3/thin diet due to patient's cognition, oral transiting discoordination with liquids and vallecular more than piriform sinus retention (without awareness to retention).   SLP will follow briefly for dysphagia management, education with family/pt. Did not test postures such as chin tuck due to pt's cervical spine surgery and potentially pending MRI.  Factors that may increase risk of adverse event in presence of aspiration Roderick Civatte & Jessy Morocco 2021): Reduced cognitive function;Limited mobility  Swallow Evaluation Recommendations Recommendations: PO diet PO Diet Recommendation: Dysphagia 3 (Mechanical soft);Thin liquids (Level 0) Liquid Administration via: Cup;Straw Medication Administration: Other (Comment) (Whole with Ensure, applesauce or ice cream as tolerated.) Supervision: Full supervision/cueing for swallowing strategies (Full assist initially, especially in light of premorbid  dysphagia per  prior SLP note) Swallowing strategies  : Slow rate;Minimize environmental distractions;Small bites/sips Postural changes: Stay upright 30-60 min after meals;Position pt fully upright for meals Oral care recommendations: Oral care BID (2x/day)   Maudie Sorrow, MS [facility] SLP Acute Rehab Services Office [phone]    Chantal Comment 05/16/2024,11:18 AM

## 2024-05-16 NOTE — TOC Initial Note (Addendum)
 Transition of Care [facility]) - Initial/Assessment Note    Patient Details  Name: Willie [name] MRN: [id number] Date of Birth: 1961/04/13  [facility]) CM/SW Contact:    Levie Ream, RN Phone Number: 05/16/2024, 2:48 PM  Clinical Narrative:                 TOC for d/c planning; PT recc SNF; spoke w/ pt and wife Willie [name] [phone]) in room; pt is from home; they agree to PT recc for SNF; pt/wife deny SDOH risks; insurance/PCP verified; pt has cane, walker, BSC, and shower chair; he has HH services w/ [facility]; he does not have home oxygen; Mrs Gendron says her husband has glasses; he does not have HA or dentures; explained SNF process and ins auth needed; pt's wife verbalized understanding; pt/spouse do not have facility preference; will fax out for SNF bed; awaiting bed offers.  Expected Discharge Plan: Skilled Nursing Facility Barriers to Discharge: Continued Medical Work up   Patient Goals and CMS Choice Patient states their goals for this hospitalization and ongoing recovery are:: SNF CMS Medicare.gov Compare Post Acute Care list provided to:: Patient Represenative (must comment) Willie [name] (spouse))        Expected Discharge Plan and Services   Discharge Planning Services: CM Consult   Living arrangements for the past 2 months: Single Family Home                                      Prior Living Arrangements/Services Living arrangements for the past 2 months: Single Family Home Lives with:: Spouse Patient language and need for interpreter reviewed:: Yes Do you feel safe going back to the place where you live?: Yes      Need for Family Participation in Patient Care: Yes (Comment) Care giver support system in place?: Yes (comment) Current home services: DME (cane, walker, BSC, shower chair) Criminal Activity/Legal Involvement Pertinent to Current Situation/Hospitalization: No - Comment as needed  Activities of Daily Living   ADL  Screening (condition at time of admission) Independently performs ADLs?: Yes (appropriate for developmental age) Is the patient deaf or have difficulty hearing?: No Does the patient have difficulty seeing, even when wearing glasses/contacts?: No Does the patient have difficulty concentrating, remembering, or making decisions?: Yes  Permission Sought/Granted Permission sought to share information with : Case Manager Permission granted to share information with : Yes, Verbal Permission Granted  Share Information with NAME: Case Manager     Permission granted to share info w Relationship: Willie [name] (spouse) [phone]     Emotional Assessment Appearance:: Appears stated age Attitude/Demeanor/Rapport: Gracious Affect (typically observed): Accepting Orientation: : Oriented to Self, Oriented to Place Alcohol / Substance Use: Not Applicable Psych Involvement: No (comment)  Admission diagnosis:  Acute metabolic encephalopathy [G93.41] Altered mental status, unspecified [R41.82] DKA (diabetic ketoacidosis) (HCC) [E11.10] Patient Active Problem List   Diagnosis Date Noted   Acute metabolic encephalopathy 05/08/2024   Worsening short-term memory loss 05/08/2024   Epidural hematoma (HCC) 05/08/2024   Chronic pain syndrome 05/08/2024   Cervical spinal stenosis s/p fusion 5/7 05/08/2024   History of gait instability 05/08/2024   Hypothyroidism 05/08/2024   Mild cognitive impairment 05/08/2024   Insulin  dependent type 2 diabetes mellitus (HCC) 05/08/2024   History of choking 05/08/2024   Altered mental status, unspecified 05/08/2024   DKA (diabetic ketoacidosis) (HCC) 05/08/2024   S/P cervical spinal fusion 05/01/2024  PULMONARY SARCOIDOSIS 12/30/2010   COUGH 12/30/2010   OBSTRUCTIVE SLEEP APNEA 05/16/2008   DIABETES, TYPE 1 05/15/2008   Hyperlipidemia 05/15/2008   OBESITY 05/15/2008   Generalized anxiety disorder 05/15/2008   Chronic depression 05/15/2008   HYPERTENSION  05/15/2008   G E R D 05/15/2008   PCP:  Rosslyn Coons, MD Pharmacy:   [facility] [place], [place] - [address] [place] [address] Phone: [phone] Fax: [phone]     Social Drivers of Health (SDOH) Social History: SDOH Screenings   Food Insecurity: No Food Insecurity (05/16/2024)  Housing: Low Risk  (05/16/2024)  Transportation Needs: No Transportation Needs (05/16/2024)  Utilities: Not At Risk (05/16/2024)  Tobacco Use: Medium Risk (05/08/2024)   SDOH Interventions: Food Insecurity Interventions: Intervention Not Indicated, Inpatient TOC Housing Interventions: Intervention Not Indicated, Inpatient TOC Transportation Interventions: Intervention Not Indicated, Inpatient TOC Utilities Interventions: Intervention Not Indicated, Inpatient TOC   Readmission Risk Interventions    05/16/2024    2:42 PM  Readmission Risk Prevention Plan  Transportation Screening Complete  PCP or Specialist Appt within 5-7 Days Complete  Home Care Screening Complete  Medication Review (RN CM) Complete

## 2024-05-16 NOTE — Progress Notes (Addendum)
 MBS completed full report to follow.  Fluoroscopy loops not quite loaded into PACs system therefore SLP unable to review at this time.  Preliminary test results indicate patient with minimal dysphagia likely primarily cognitive based.  Slight delay in oral transiting most notably with liquids with patient benefiting from verbal cues at times to swallow at times.  Pharyngeal swallow notable for mild vallecular more than piriform sinus retention (due to decreased tongue base retraction with impaired epiglottic deflection) without patient awareness.  Cued expectoration not effective to clear vallecular retention however further intake did not significantly add to mild retention.    Patient notably with premature spillage of pure mixed with secretions to the piriform sinus prior to swallow initiation.  And with initial bolus of pure.  He demonstrated gagging response propelling bolus that was aggregating at vallecular space into oral cavity.  He subsequently was able to swallow reflexively to clear.  Second pured bolus provided with cue for patient to conduct effortful swallow.  Patient demonstrated ability to gather bolus in oral cavity fully to rapidly propel entire bolus through pharynx.  This strategy also resulted in less retention.  Adequate rotary mastication noted with solid patient with functional pharyngeal clearance.    Patient was challenged with sequential boluses of thin without airway compromise but he does tend to feel his mouth full and allow premature spillage prior to swallow trigger.    Barium tablet provided with nectar thick liquid easily transited through oropharynx into esophagus.  Upon esophageal sweep patient appeared clear, MBS does not assess below the PES.  At this time recommend Dysphagia 3/thin diet due to patient's cognition, oral transiting discoordination with liquids and vallecular more than piriform sinus retention (without awareness to retention).   SLP will follow briefly  for dysphagia management, education with family/pt.    Maudie Sorrow, MS [facility] SLP Acute [employer] [phone]

## 2024-05-16 NOTE — Progress Notes (Signed)
 RN reports pt alert and taking in Ensure and his medication with applesauce. She reports he continues with some coughing but it I not coorelated to po intake.  Recommend proceed with MBS this am - await approval from MD due to potential cervical spine MRI.  Communicated with RN via secure chat.  Thanks.    Maudie Sorrow, MS [facility] SLP Acute [employer] [phone]

## 2024-05-16 NOTE — TOC Progression Note (Signed)
 Transition of Care [facility]) - Progression Note    Patient Details  Name: Willie [name] MRN: [id number] Date of Birth: 03-07-1961  Transition of Care [facility]) CM/SW Contact  Levie Ream, RN Phone Number: 05/16/2024, 1:29 PM  Clinical Narrative:    [facility] consulted for pt qualification for CIR; referral sent to Jeannetta Millman at [facility]; please see her note; will speak w/ pt/wife regarding SNF.       Expected Discharge Plan and Services                                               Social Determinants of Health (SDOH) Interventions SDOH Screenings   Food Insecurity: Patient Unable To Answer (05/13/2024)  Housing: Patient Unable To Answer (05/13/2024)  Transportation Needs: Patient Unable To Answer (05/13/2024)  Utilities: Patient Unable To Answer (05/13/2024)  Tobacco Use: Medium Risk (05/08/2024)    Readmission Risk Interventions     No data to display

## 2024-05-16 NOTE — NC FL2 (Signed)
 [place]  MEDICAID FL2 LEVEL OF CARE FORM     IDENTIFICATION  Patient Name: Willie [name] Birthdate: [birth year] Sex: male Admission Date (Current Location): 05/07/2024  [facility] and [place] Number:  Producer, television/film/video and Address:  [facility],  [address] [place], [place] [address]      Provider Number: [id number]  Attending Physician Name and Address:  Josiah Nigh, MD  Relative Name and Phone Number:  Zameer Borman (spouse) [phone]    Current Level of Care: Hospital Recommended Level of Care: Skilled Nursing Facility Prior Approval Number:    Date Approved/Denied:   PASRR Number: pending  Discharge Plan: SNF    Current Diagnoses: Patient Active Problem List   Diagnosis Date Noted   Acute metabolic encephalopathy 05/08/2024   Worsening short-term memory loss 05/08/2024   Epidural hematoma (HCC) 05/08/2024   Chronic pain syndrome 05/08/2024   Cervical spinal stenosis s/p fusion 5/7 05/08/2024   History of gait instability 05/08/2024   Hypothyroidism 05/08/2024   Mild cognitive impairment 05/08/2024   Insulin  dependent type 2 diabetes mellitus (HCC) 05/08/2024   History of choking 05/08/2024   Altered mental status, unspecified 05/08/2024   DKA (diabetic ketoacidosis) (HCC) 05/08/2024   S/P cervical spinal fusion 05/01/2024   PULMONARY SARCOIDOSIS 12/30/2010   COUGH 12/30/2010   OBSTRUCTIVE SLEEP APNEA 05/16/2008   DIABETES, TYPE 1 05/15/2008   Hyperlipidemia 05/15/2008   OBESITY 05/15/2008   Generalized anxiety disorder 05/15/2008   Chronic depression 05/15/2008   HYPERTENSION 05/15/2008   G E R D 05/15/2008    Orientation RESPIRATION BLADDER Height & Weight     Self  Normal Indwelling catheter Weight: 93.9 kg Height:  5\' 9"  (175.3 cm)  BEHAVIORAL SYMPTOMS/MOOD NEUROLOGICAL BOWEL NUTRITION STATUS      Incontinent Diet (Dysphagia 3, thin liquids)  AMBULATORY STATUS COMMUNICATION OF NEEDS Skin   Extensive Assist Verbally Other  (Comment) (erythema bilateral arms; rash on abdomen)                       Personal Care Assistance Level of Assistance  Bathing, Feeding, Dressing Bathing Assistance: Maximum assistance Feeding assistance: Maximum assistance Dressing Assistance: Maximum assistance     Functional Limitations Info  Sight, Hearing, Speech Sight Info: Impaired Hearing Info: Adequate Speech Info: Adequate    SPECIAL CARE FACTORS FREQUENCY  PT (By licensed PT), OT (By licensed OT)     PT Frequency: 5x/week OT Frequency: 5x/week            Contractures Contractures Info: Not present    Additional Factors Info  Code Status, Allergies, Psychotropic, Insulin  Sliding Scale Code Status Info: Full Code Allergies Info: Other, Latex Psychotropic Info: see MAR Insulin  Sliding Scale Info: See MAR       Current Medications (05/16/2024):  This is the current hospital active medication list Current Facility-Administered Medications  Medication Dose Route Frequency Provider Last Rate Last Admin   acetaminophen  (TYLENOL ) tablet 650 mg  650 mg Per NG tube Q6H PRN Smith, Joshua C, NP       carvedilol  (COREG ) tablet 12.5 mg  12.5 mg Oral BID WC Smith, Joshua C, NP       cefTRIAXone (ROCEPHIN) 2 g in sodium chloride  0.9 % 100 mL IVPB  2 g Intravenous Daily Josiah Nigh, MD   Stopped at 05/16/24 1004   Chlorhexidine  Gluconate Cloth 2 % PADS 6 each  6 each Topical Q2200 Chatterjee, Srobona Tublu, MD   6 each at 05/15/24  2120   cloNIDine  (CATAPRES  - Dosed in mg/24 hr) patch 0.3 mg  0.3 mg Transdermal Weekly Fonnie Iba I, MD   0.3 mg at 05/13/24 1325   dextrose  50 % solution 0-50 mL  0-50 mL Intravenous PRN Paliwal, Zenda Highman, MD       enoxaparin (LOVENOX) injection 40 mg  40 mg Subcutaneous Q24H Josiah Nigh, MD   40 mg at 05/15/24 2119   feeding supplement (GLUCERNA SHAKE) (GLUCERNA SHAKE) liquid 237 mL  237 mL Oral TID BM Josiah Nigh, MD       hydrALAZINE (APRESOLINE) injection 10 mg  10 mg  Intravenous Q6H PRN Rai, Ripudeep K, MD   10 mg at 05/16/24 0234   insulin  aspart (novoLOG ) injection 0-20 Units  0-20 Units Subcutaneous Q4H Josiah Nigh, MD   11 Units at 05/16/24 1152   insulin  glargine-yfgn (SEMGLEE ) injection 25 Units  25 Units Subcutaneous BID Josiah Nigh, MD   25 Units at 05/16/24 0931   ipratropium-albuterol  (DUONEB) 0.5-2.5 (3) MG/3ML nebulizer solution 3 mL  3 mL Nebulization Q4H PRN Chatterjee, Srobona Tublu, MD       labetalol (NORMODYNE) injection 10 mg  10 mg Intravenous Q2H PRN Paliwal, Zenda Highman, MD   10 mg at 05/16/24 0420   melatonin tablet 3 mg  3 mg Per Tube QHS Smith, Joshua C, NP       OLANZapine  zydis (ZYPREXA ) disintegrating tablet 5 mg  5 mg Per Tube QHS Smith, Joshua C, NP       ondansetron  (ZOFRAN ) tablet 4 mg  4 mg Per NG tube Q6H PRN Smith, Joshua C, NP       Or   ondansetron  (ZOFRAN ) injection 4 mg  4 mg Intravenous Q6H PRN Smith, Joshua C, NP       Oral care mouth rinse  15 mL Mouth Rinse PRN Alden Humphrey Tublu, MD   15 mL at 05/09/24 0951   pneumococcal 20-valent conjugate vaccine (PREVNAR 20) injection 0.5 mL  0.5 mL Intramuscular Prior to discharge Josiah Nigh, MD       sodium chloride  flush (NS) 0.9 % injection 3 mL  3 mL Intravenous Q12H Sundil, Subrina, MD   3 mL at 05/16/24 1610   sodium chloride  flush (NS) 0.9 % injection 3 mL  3 mL Intravenous Q12H Sundil, Subrina, MD   3 mL at 05/16/24 9604   sodium chloride  flush (NS) 0.9 % injection 3 mL  3 mL Intravenous PRN Sundil, Subrina, MD       thiamine (VITAMIN B1) injection 100 mg  100 mg Intravenous Q24H Josiah Nigh, MD         Discharge Medications: Please see discharge summary for a list of discharge medications.  Relevant Imaging Results:  Relevant Lab Results:   Additional Information SSN [id number]  Levie Ream, RN

## 2024-05-16 NOTE — Plan of Care (Signed)
  Problem: Coping: Goal: Ability to adjust to condition or change in health will improve Outcome: Progressing   Problem: Health Behavior/Discharge Planning: Goal: Ability to identify and utilize available resources and services will improve Outcome: Progressing Goal: Ability to manage health-related needs will improve Outcome: Progressing   Problem: Education: Goal: Ability to describe self-care measures that may prevent or decrease complications (Diabetes Survival Skills Education) will improve Outcome: Progressing Goal: Individualized Educational Video(s) Outcome: Progressing

## 2024-05-17 DIAGNOSIS — G9341 Metabolic encephalopathy: Secondary | ICD-10-CM | POA: Diagnosis not present

## 2024-05-17 LAB — GLUCOSE, CAPILLARY
Glucose-Capillary: 136 mg/dL — ABNORMAL HIGH (ref 70–99)
Glucose-Capillary: 106 mg/dL — ABNORMAL HIGH (ref 70–99)
Glucose-Capillary: 316 mg/dL — ABNORMAL HIGH (ref 70–99)
Glucose-Capillary: 287 mg/dL — ABNORMAL HIGH (ref 70–99)
Glucose-Capillary: 189 mg/dL — ABNORMAL HIGH (ref 70–99)

## 2024-05-17 MED ORDER — METOPROLOL TARTRATE 5 MG/5ML IV SOLN: 5.0000 mg | INTRAVENOUS | Status: DC | PRN

## 2024-05-17 MED ORDER — OLANZAPINE 10 MG IM SOLR
5.0000 mg | Freq: Once | INTRAMUSCULAR | Status: AC | PRN
  Administered 2024-05-17: 5 mg via INTRAMUSCULAR
  Filled 2024-05-17: qty 10

## 2024-05-17 MED ORDER — GLUCAGON HCL RDNA (DIAGNOSTIC) 1 MG IJ SOLR: 1.0000 mg | INTRAMUSCULAR | Status: DC | PRN

## 2024-05-17 MED ORDER — THIAMINE MONONITRATE 100 MG PO TABS
100.0000 mg | ORAL_TABLET | Freq: Every day | ORAL | Status: DC
  Administered 2024-05-17 – 2024-05-22 (×6): 100 mg via ORAL
  Filled 2024-05-17 (×6): qty 1

## 2024-05-17 MED ORDER — ENSURE ENLIVE PO LIQD
237.0000 mL | Freq: Three times a day (TID) | ORAL | Status: DC
  Administered 2024-05-17 – 2024-05-19 (×4): 237 mL via ORAL

## 2024-05-17 MED ORDER — GUAIFENESIN 100 MG/5ML PO LIQD: 5.0000 mL | ORAL | Status: DC | PRN

## 2024-05-17 MED ORDER — SENNOSIDES-DOCUSATE SODIUM 8.6-50 MG PO TABS: 1.0000 | ORAL_TABLET | Freq: Every evening | ORAL | Status: DC | PRN

## 2024-05-17 MED ORDER — ADULT MULTIVITAMIN W/MINERALS CH
1.0000 | ORAL_TABLET | Freq: Every day | ORAL | Status: DC
  Administered 2024-05-18 – 2024-05-22 (×5): 1 via ORAL
  Filled 2024-05-17 (×7): qty 1

## 2024-05-17 MED ORDER — PHENYLEPHRINE IN HARD FAT 0.25 % RE SUPP
1.0000 | Freq: Two times a day (BID) | RECTAL | Status: DC | PRN
  Administered 2024-05-17 – 2024-05-19 (×3): 1 via RECTAL
  Filled 2024-05-17 (×4): qty 1

## 2024-05-17 NOTE — Progress Notes (Signed)
 SLP Cancellation Note  Patient Details Name: Willie [name] MRN: [id number] DOB: 07/16/1961   Cancelled treatment:       Reason Eval/Treat Not Completed: Other (comment) (pt with RN and family at this time; will continue effrts)  Maudie Sorrow, MS [facility] SLP Acute Rehab Services Office [phone]  Chantal Comment 05/17/2024, 1:00 PM

## 2024-05-17 NOTE — Progress Notes (Signed)
 Nutrition Follow-up  DOCUMENTATION CODES:   Obesity unspecified  INTERVENTION:  - DYS 3 diet per SLP.   - Feeding assistance with all meals given ongoing confusion. - Ensure Plus High Protein po TID, each supplement provides 350 kcal and 20 grams of protein. - Encourage intake at all meals and of supplements. - Add Multivitamin with minerals daily.  - Monitor weight trends.   NUTRITION DIAGNOSIS:   Inadequate oral intake related to lethargy/confusion as evidenced by NPO status, energy intake < or equal to 50% for > or equal to 5 days. *ongoing  GOAL:   Patient will meet greater than or equal to 90% of their needs *progressing  MONITOR:   Diet advancement, Weight trends  REASON FOR ASSESSMENT:   Consult Enteral/tube feeding initiation and management, Assessment of nutrition requirement/status  ASSESSMENT:   63 y.o. male with PMH significant of mild cognitive impairment, GAD, depression, chronic pain syndrome, reactive airway disease, insulin -dependent DM type II, HLD and cervical stenosis who underwent a spine fusion 05/01/2024 and presented to the ED 5/13 with worsening confusion.  5/13: Admit; NPO  5/19: FLD 5/20: NPO 5/21: Cortrak placement attempted but failed 5/22: MSB -> DYS 3 diet  Patient in bedside chair at time of visit. Wife and family at bedside.  Diet advanced to DYS 3 yesterday after MBS. Patient reports lunch tray in front of his is the first tray he has received but wife reports patient refused both lunch and dinner trays yesterday.   RN reports the patient had a sausage patty for breakfast today but refused most other things. Stressed importance of trying to eat something at each meal and not skipping any meals. Patient has had minimal intake since admission 10 days ago.   Reported he did not like Glucerna, but family note he likes chocolate and has been receiving vanilla Glucerna. Will change supplements back to Ensure as wife feels this is what the  patient was drinking at home and enjoys. Discussed with RN.   Patient has been getting automatic trays but patient seemingly not very accepting of available foods on diet. Wife to now order patient trays he will like.   Discussed with RN will also add assistance with feeding order as patient seems to need a lot of encouragement to eat.   Admission weight: 214# Current weight: 207#  Medications reviewed and include: 100mg  thiamine  Labs reviewed:  HA1C 7.5 Blood Glucose 106-245 x24 hours   Diet Order:   Diet Order             DIET DYS 3 Room service appropriate? Yes; Fluid consistency: Thin  Diet effective now                   EDUCATION NEEDS: Education needs have been addressed  Skin:  Skin Assessment: Reviewed RN Assessment  Last BM:  5/22  Height:  Ht Readings from Last 1 Encounters:  05/07/24 5\' 9"  (1.753 m)   Weight:  Wt Readings from Last 1 Encounters:  05/16/24 93.9 kg    BMI:  Body mass index is 30.57 kg/m.  Estimated Nutritional Needs:  Kcal:  1950-2300 kcals Protein:  100-115 grams Fluid:  >/= 2L    Scheryl Cushing RD, LDN Contact via Secure Chat.

## 2024-05-17 NOTE — Plan of Care (Signed)
  Problem: Education: Goal: Ability to describe self-care measures that may prevent or decrease complications (Diabetes Survival Skills Education) will improve Outcome: Progressing Goal: Individualized Educational Video(s) Outcome: Progressing   Problem: Coping: Goal: Ability to adjust to condition or change in health will improve Outcome: Progressing   Problem: Fluid Volume: Goal: Ability to maintain a balanced intake and output will improve Outcome: Progressing   Problem: Health Behavior/Discharge Planning: Goal: Ability to identify and utilize available resources and services will improve Outcome: Progressing Goal: Ability to manage health-related needs will improve Outcome: Progressing   Problem: Metabolic: Goal: Ability to maintain appropriate glucose levels will improve Outcome: Progressing   Problem: Nutritional: Goal: Maintenance of adequate nutrition will improve Outcome: Progressing Goal: Progress toward achieving an optimal weight will improve Outcome: Progressing   Problem: Skin Integrity: Goal: Risk for impaired skin integrity will decrease Outcome: Progressing   Problem: Tissue Perfusion: Goal: Adequacy of tissue perfusion will improve Outcome: Progressing   Problem: Education: Goal: Ability to describe self-care measures that may prevent or decrease complications (Diabetes Survival Skills Education) will improve Outcome: Progressing Goal: Individualized Educational Video(s) Outcome: Progressing   Problem: Cardiac: Goal: Ability to maintain an adequate cardiac output will improve Outcome: Progressing   Problem: Health Behavior/Discharge Planning: Goal: Ability to identify and utilize available resources and services will improve Outcome: Progressing Goal: Ability to manage health-related needs will improve Outcome: Progressing   Problem: Fluid Volume: Goal: Ability to achieve a balanced intake and output will improve Outcome: Progressing    Problem: Metabolic: Goal: Ability to maintain appropriate glucose levels will improve Outcome: Progressing   Problem: Nutritional: Goal: Maintenance of adequate nutrition will improve Outcome: Progressing Goal: Maintenance of adequate weight for body size and type will improve Outcome: Progressing   Problem: Respiratory: Goal: Will regain and/or maintain adequate ventilation Outcome: Progressing   Problem: Urinary Elimination: Goal: Ability to achieve and maintain adequate renal perfusion and functioning will improve Outcome: Progressing   Problem: Safety: Goal: Non-violent Restraint(s) Outcome: Progressing   Problem: Education: Goal: Knowledge of General Education information will improve Description: Including pain rating scale, medication(s)/side effects and non-pharmacologic comfort measures Outcome: Progressing   Problem: Health Behavior/Discharge Planning: Goal: Ability to manage health-related needs will improve Outcome: Progressing   Problem: Clinical Measurements: Goal: Ability to maintain clinical measurements within normal limits will improve Outcome: Progressing Goal: Will remain free from infection Outcome: Progressing Goal: Diagnostic test results will improve Outcome: Progressing Goal: Respiratory complications will improve Outcome: Progressing Goal: Cardiovascular complication will be avoided Outcome: Progressing   Problem: Activity: Goal: Risk for activity intolerance will decrease Outcome: Progressing   Problem: Nutrition: Goal: Adequate nutrition will be maintained Outcome: Progressing   Problem: Coping: Goal: Level of anxiety will decrease Outcome: Progressing   Problem: Elimination: Goal: Will not experience complications related to bowel motility Outcome: Progressing Goal: Will not experience complications related to urinary retention Outcome: Progressing   Problem: Pain Managment: Goal: General experience of comfort will improve  and/or be controlled Outcome: Progressing   Problem: Safety: Goal: Ability to remain free from injury will improve Outcome: Progressing   Problem: Skin Integrity: Goal: Risk for impaired skin integrity will decrease Outcome: Progressing

## 2024-05-17 NOTE — Progress Notes (Addendum)
 Speech Language Pathology Treatment: Dysphagia  Patient Details Name: Willie [name] MRN: [id number] DOB: 1961/02/24 Today's Date: 05/17/2024 Time: 0630-1601 SLP Time Calculation (min) (ACUTE ONLY): 44 min  Assessment / Plan / Recommendation Clinical Impression  Patient seen for skilled SLP for dysphagia management. Intake has been very poor per wife and patient inconsistently will swallow medications. Patient eating cut up a cheese and ham sandwich, orange sherbert and drinking soda upon SLP entrance to room. Wife intermittently fed him and patient is able to feed himself. His attention is impaired and he benefits from moderate verbal cues to focus on swallowing and eat during session.    Subtle cough x1 noted during meal without appearance of discomfort and did not appear to be due to "aspiration". Upon further questioning regarding patient's swallowing, wife and patient reports he will cough and/or "choke" on occasion with food more than liquids starting several months ago with gradual onset. Coughing/choking may occur at the beginning of a meal. Denies this being consistency specific (i.e. meat, breads, fruit etc) and pt does not regurgitate food with coughing episode. Wife reports pt will usually drink liquids after meals, encouraged liquids before meals for moisture - especially if choking with solids. Wife also reports pt has some chronic nausea issues as well.     Reviewed flouroscopy loops of MBS with pt/wife and advised pt maintain strength of cough, and ambulate at home safely. Recommend advance diet to regular/thin and provide po medications as tolerated. Given pt has some premorbid dysphagia to solids more than liquids and findings on MBS, dysphagia symptoms likely not pharyngeal, cervical esophageal related.  Thanks for allowing this SLP to assist with pt's care plan.     HPI HPI: Patient is a 63 y.o. male with PMH: cognitive impairment (undiagnosed but spouse suspects Lewy body  dementia), depression, GAD, chronic pain syndrome, h/o gait instability, reactive airway disease, IDDM-2, hypothyroidism, HLD, cervical stenosis. He underwent ACDF surgery 05/01/24. He presented to the hospital on 05/08/24 with increasing confusion and hallucinations  which seems to get worse at night. His spouse reported that he has had worsening choking of food that occured even prior to ACDF surgery. He has been NPO and swallow evaluation has not been able to be completed due to patient with significant agitation, confusion, requiring 4-point restraints. CT cervical spine showed small amount of patchy, ground glass disease of left upper lobe apex, could be due to PNA, edema or scarring.  Initially was placed on full liquid nectar thick diet but this was changed to n.p.o.  NG tube placement attempted yesterday in IR but was unable to be transited into esophagus.  Patient underwent MBS on 05/16/2022 and follow-up indicated.      SLP Plan  All goals met      Recommendations for follow up therapy are one component of a multi-disciplinary discharge planning process, led by the attending physician.  Recommendations may be updated based on patient status, additional functional criteria and insurance authorization.    Recommendations  Diet recommendations: Regular;Thin liquid Liquids provided via: Cup;Straw Medication Administration: Other (Comment) (as tolerated) Supervision: Patient able to self feed Compensations: Slow rate;Small sips/bites;Other (Comment) (start intake with liquids) Postural Changes and/or Swallow Maneuvers: Seated upright 90 degrees;Upright 30-60 min after meal                  Oral care BID   None Dysphagia, unspecified (R13.10)     All goals met    Maudie Sorrow, MS [facility] SLP Acute Rehab  Services Office [phone]  Chantal Comment  05/17/2024, 5:20 PM

## 2024-05-17 NOTE — TOC PASRR Note (Signed)
 30 Day PASRR Note  Patient Details  Name: Willie [name] Date of Birth: [birth year]  Transition of Care [facility]) CM/SW Contact:    Zenon Hilda, LCSW Phone Number: 05/17/2024, 10:35 AM  To Whom It May Concern:  Please be advised that this patient will require a short-term nursing home stay - anticipated 30 days or less for rehabilitation and strengthening. The plan is for return home.

## 2024-05-17 NOTE — Progress Notes (Addendum)
 Physical Therapy Treatment Patient Details Name: Willie [name] MRN: [id number] DOB: 24-Nov-1961 Today's Date: 05/17/2024   History of Present Illness Patient 63 year old male presenting [facility] with AMS on 05/07/2024. S/p ACDF 05/01/2024 developing hallucinations and confusion. W/u pending for dementia, r/o PNA.   PMH includes but is not limited to:  cognitive difficulties, possible MCI at baseline, anxiety, depression, diabetes, diabetic nephropathy and retinopathy, fibromyalgia, hypertension, hyperlipidemia, sleep apnea, documented history of essential tremor    PT Comments  Pt making good progress today with goals met and updated.  He was able to ambulate 250' x 2 with RW but at decreased speed (indicative of fall risk) , multiple LOB requiring min A, and mod cues for safety and focus on task at hand.  Will continue POC.  Current recommendation Patient will benefit from continued inpatient follow up therapy, <3 hours/day but has good potential to progress.   Addendum: Pt's wife spoke with therapy later and reports leaning toward taking him home at d/c.  Mentioned that she is working on getting some help into home (particularly at night).  With pt's good progress with mobility today, therapy agrees if pt has 24 hr supervision home is a feasible/good option. Would recommend HHPT.  Provided wife with gait belt for safety.  Also, educated that chair/bed alarms are available for purchase (online, medical supply stores, etc).    If plan is discharge home, recommend the following: A little help with walking and/or transfers;A little help with bathing/dressing/bathroom;Assistance with cooking/housework;Help with stairs or ramp for entrance   Can travel by private vehicle     Yes  Equipment Recommendations  Rolling walker (2 wheels)    Recommendations for Other Services       Precautions / Restrictions Precautions Precautions: Fall;Cervical Recall of Precautions/Restrictions:  Impaired Precaution/Restrictions Comments: Reviewed C spine precautions with pt and spouse Other Brace: no brace needed- soft collar brace ordered since last hospitalization Restrictions Weight Bearing Restrictions Per Provider Order: No     Mobility  Bed Mobility               General bed mobility comments: in chair    Transfers Overall transfer level: Needs assistance Equipment used: Rolling walker (2 wheels) Transfers: Sit to/from Stand Sit to Stand: Min assist           General transfer comment: min A to steady and cues for hand placement    Ambulation/Gait Ambulation/Gait assistance: Min assist Gait Distance (Feet): 250 Feet (250'x2) Assistive device: Rolling walker (2 wheels) Gait Pattern/deviations: Step-to pattern, Decreased stride length, Trunk flexed Gait velocity: decreased Gait velocity interpretation: <1.8 ft/sec, indicate of risk for recurrent falls   General Gait Details: Pt requiring frequent min A for balance with LOB to side or posteriorly; He required mod cues for RW use and focus on walking   Stairs             Wheelchair Mobility     Tilt Bed    Modified Rankin (Stroke Patients Only)       Balance Overall balance assessment: Needs assistance Sitting-balance support: No upper extremity supported, Feet supported Sitting balance-Leahy Scale: Good     Standing balance support: Bilateral upper extremity supported Standing balance-Leahy Scale: Poor Standing balance comment: RW and min A                            Communication    Cognition Arousal: Alert Behavior During Therapy:  Flat affect   PT - Cognitive impairments: Orientation, Awareness, Attention, Problem solving, Safety/Judgement, Memory   Orientation impairments: Time, Situation                   PT - Cognition Comments: Needs mod cues to focus on task at hand and for safety        Cueing Cueing Techniques: Verbal cues, Tactile cues   Exercises      General Comments General comments (skin integrity, edema, etc.): HR 90's      Pertinent Vitals/Pain Pain Assessment Pain Assessment: No/denies pain    Home Living                          Prior Function            PT Goals (current goals can now be found in the care plan section) Acute Rehab PT Goals PT Goal Formulation: With patient Time For Goal Achievement: 05/31/24 Progress towards PT goals: Goals met and updated - see care plan    Frequency    Min 2X/week      PT Plan      Co-evaluation              AM-PAC PT "6 Clicks" Mobility   Outcome Measure  Help needed turning from your back to your side while in a flat bed without using bedrails?: A Lot (Min A but mod cues for all mobility) Help needed moving from lying on your back to sitting on the side of a flat bed without using bedrails?: A Lot Help needed moving to and from a bed to a chair (including a wheelchair)?: A Lot Help needed standing up from a chair using your arms (e.g., wheelchair or bedside chair)?: A Lot Help needed to walk in hospital room?: A Lot Help needed climbing 3-5 steps with a railing? : A Lot 6 Click Score: 12    End of Session Equipment Utilized During Treatment: Gait belt Activity Tolerance: Patient tolerated treatment well (family present and pt wanting to visit after walk) Patient left: with call bell/phone within reach;in chair;with chair alarm set;with family/visitor present Nurse Communication: Mobility status PT Visit Diagnosis: Unsteadiness on feet (R26.81);Other abnormalities of gait and mobility (R26.89);Muscle weakness (generalized) (M62.81);History of falling (Z91.81);Difficulty in walking, not elsewhere classified (R26.2)     Time: 4540-9811 PT Time Calculation (min) (ACUTE ONLY): 24 min  Charges:    $Gait Training: 8-22 mins $Therapeutic Activity: 8-22 mins PT General Charges $$ ACUTE PT VISIT: 1 Visit                     Cyd Dowse,  PT Acute Rehab [facility] Rehab [phone]    Willie [name] 05/17/2024, 11:50 AM

## 2024-05-17 NOTE — Inpatient Diabetes Management (Addendum)
 Inpatient Diabetes Program Recommendations  AACE/ADA: New Consensus Statement on Inpatient Glycemic Control (2015)  Target Ranges:  Prepandial:   less than 140 mg/dL      Peak postprandial:   less than 180 mg/dL (1-2 hours)      Critically ill patients:  140 - 180 mg/dL   Lab Results  Component Value Date   GLUCAP 316 (H) 05/17/2024   HGBA1C 7.5 (H) 04/16/2024    Review of Glycemic Control  Diabetes history: DM1 Outpatient Diabetes medications: OmniPod or NPH 0-25 units bid prn for elevated glucose, Regular insulin  0-20 units tid before meals  Current orders for Inpatient glycemic control: Semglee  25 BID, Novolog  0-20 Q4H  Inpatient Diabetes Program Recommendations:    Add Novolog  5 units TID for meal coverage if eating > 50%  Continue to follow.   Thank you. Joni Net, RD, LDN, CDCES Inpatient Diabetes Coordinator [phone]  Addendum:  Pt's wife interested in downloading husband's program on her phone and was unable to do. Gave pt's wife Dexcom G7 book with 1-800 number to call and let Dexcom rep walk through the process with her. Gave sample Dexcom G7 to apply prior to discharge. Answered questions. RV

## 2024-05-17 NOTE — TOC Progression Note (Signed)
 Transition of Care [facility]) - Progression Note   Patient Details  Name: Willie [name] MRN: [id number] Date of Birth: Aug 17, 1961  Transition of Care [facility]) CM/SW Contact  Zenon Hilda, LCSW Phone Number: 05/17/2024, 11:40 AM  Clinical Narrative: Clinicals uploaded to [facility] MUST for PASRR review. Patient is currently oriented x2. CSW met with wife to provide the following bed offers:  [facility] [address] [place], [place] [address] [phone] Overall rating? Much below average  [facility] [address] [place], [place] [address] [phone] Overall rating ?? Below average  [facility] [address] [place], [place] [address] [phone] Overall rating ? Much below average  [facility] [address] [place], [place] [address] [phone] Overall rating ?? Below average  After wife reviewed bed offers, she did not want to place the patient at those listed due to low ratings. Wife asked about [facility] and [facility], which did not make bed offers.  Wife asked about memory care and ALFs. CSW explained that TOC would not be able to place patient for LTC, but can place for rehab. Wife reported she is looking into LTC and the patient possibly coming home with HH instead. Wife requested that bed search be expanded to [place] and [place] counties.  CSW expanded bed search in hub. CSW reached out to [facility], [facility], and [facility] to have referral reviewed again. [facility] declined referral.  CSW discussed referral to [facility] with wife. Wife agreeable to CSW making referral. CSW made referral to [facility] with [facility], which was accepted. CSW confirmed patient is active with [facility] for PT/OT, so HH orders will need to be placed prior to discharge if patient goes home with wife. PASRR is pending, but will not be needed if patient returns home.  Expected  Discharge Plan: Skilled Nursing Facility Barriers to Discharge: Continued Medical Work up, Awaiting State Approval (PASRR), Insurance Authorization  Expected Discharge Plan and Services In-house Referral: Clinical Social Work Discharge Planning Services: CM Consult Living arrangements for the past 2 months: Single Family Home           DME Arranged: N/A DME Agency: NA  Social Determinants of Health (SDOH) Interventions SDOH Screenings   Food Insecurity: No Food Insecurity (05/16/2024)  Housing: Low Risk  (05/16/2024)  Transportation Needs: No Transportation Needs (05/16/2024)  Utilities: Not At Risk (05/16/2024)  Tobacco Use: Medium Risk (05/08/2024)   Readmission Risk Interventions    05/16/2024    2:42 PM  Readmission Risk Prevention Plan  Transportation Screening Complete  PCP or Specialist Appt within 5-7 Days Complete  Home Care Screening Complete  Medication Review (RN CM) Complete

## 2024-05-17 NOTE — Progress Notes (Signed)
 PROGRESS NOTE    Willie [name]  WUX:[id number] DOB: 05/28/1961 DOA: 05/07/2024 PCP: Rosslyn Coons, MD    Brief Narrative:  63 year old with history of cognitive impairment, DM1, depression, chronic pain, recent C-spine fusion ACDF C5-6 on 5/7 admitted for confusion.  Initial scan showed some epidural hematoma.  Neurosurgery and neurology was consulted.  Workup overall was unrevealing and recommended supportive care and treatment.  On 5/20 due to worsening blood pressure patient was transferred to ICU for better control on his blood pressure and Cleviprex was started.  Also was placed on insulin  drip at that time.  Eventually patient was weaned off the drip.  Speech therapy recommended dysphagia 3 diet.  Planning for MBS.  Insulin  drip was weaned off.  PT/OT eventually recommended CIR.   Assessment & Plan:  Principal Problem:   Acute metabolic encephalopathy Active Problems:   Worsening short-term memory loss   Epidural hematoma (HCC)   Cervical spinal stenosis s/p fusion 5/7   Hyperlipidemia   Generalized anxiety disorder   Chronic depression   Chronic pain syndrome   History of gait instability   Hypothyroidism   Mild cognitive impairment   Insulin  dependent type 2 diabetes mellitus (HCC)   History of choking   Altered mental status, unspecified   DKA (diabetic ketoacidosis) (HCC)    Acute delirium with agitation -Continue supportive care at this time.  On bedtime Cipro etc.     Hypertensive urgency Tachycardia -Slowly improving.  Currently on clonidine  patch 0.3 mg weekly, Coreg  twice daily.  IV as needed     Altered mental status apparently precipitated by recent ACDF Gait instability and h/o falling backwards Possible Lewy body dementia -Appreciate neurology and neurosurgery evaluation  - Per neurology, Dr. Bonnita Buttner, given history of gait instability/disorder remotely, patient may well have Lewy body type dementia - EEG, B12, TSH and RPR are negative and  nonrevealing -Slowly improving, alert and oriented to himself   Intermittent desaturation possibly secondary to apneic episodes Increase secretions per RN Groundglass and LUL on CT Reactive airway disease -Continue DuoNebs as needed, currently no acute wheezing -CT cervical spine showed small amount of patchy groundglass disease in LUL.   DKA with history of DM1 -DKA now has resolved -Semglee , sliding scale and Accu-Cheks.  Further adjust as necessary   Possible epidural abscess Recent ACDF -Patient has been evaluated by Dr. Rochelle Chu who notes that a small epidural abscess is not uncommon after recent ACDF.  He did not feel MRI or further workup is warranted at this time   Dysphagia -Currently followed by speech and swallow team recommending dysphagia 3 diet.  Probable MBS today   Hypothyroidism Resume home meds   Anxiety and depression Would restart meds when patient able to take p.o.       Mild protein calorie malnutrition Nutrition Problem: Inadequate oral intake Etiology: lethargy/confusion Signs/Symptoms: NPO status, energy intake < or equal to 50% for > or equal to 5 days Interventions: Refer to RD note for recommendations     Obesity class I Estimated body mass index is 31.6 kg/m as calculated from the following:   Height as of this encounter: 5\' 9"  (1.753 m).   Weight as of this encounter: 97.1 kg.  PT/OT-CIR  DVT prophylaxis: Lovenox     Code Status: Full Code Family Communication:   Status is: Inpatient Remains inpatient appropriate because: pending CIR    Subjective: Sitting in the chair.     Examination:  General exam: Appears calm and comfortable  Respiratory  system: Clear to auscultation. Respiratory effort normal. Cardiovascular system: S1 & S2 heard, RRR. No JVD, murmurs, rubs, gallops or clicks. No pedal edema. Gastrointestinal system: Abdomen is nondistended, soft and nontender. No organomegaly or masses felt. Normal bowel sounds  heard. Central nervous system: Alert and oriented. No focal neurological deficits. Extremities: Symmetric 5 x 5 power. Skin: No rashes, lesions or ulcers Psychiatry: Judgement and insight appear normal. Mood & affect appropriate.                Diet Orders (From admission, onward)     Start     Ordered   05/16/24 1051  DIET DYS 3 Room service appropriate? Yes; Fluid consistency: Thin  Diet effective now       Question Answer Comment  Room service appropriate? Yes   Fluid consistency: Thin      05/16/24 1050            Objective: Vitals:   05/16/24 1800 05/16/24 1846 05/17/24 0403 05/17/24 0638  BP: (!) 145/55 132/63 (!) 151/77 (!) 154/81  Pulse: 78 93 91 98  Resp: 16 18 20 20   Temp:  98.5 F (36.9 C) 97.6 F (36.4 C) 98 F (36.7 C)  TempSrc:  Oral Oral Oral  SpO2: 96% 97% 96% 95%  Weight:      Height:        Intake/Output Summary (Last 24 hours) at 05/17/2024 1109 Last data filed at 05/17/2024 0913 Gross per 24 hour  Intake 163.02 ml  Output 2650 ml  Net -2486.98 ml   Filed Weights   05/07/24 1602 05/16/24 0402  Weight: 97.1 kg 93.9 kg    Scheduled Meds:  carvedilol   12.5 mg Oral BID WC   Chlorhexidine  Gluconate Cloth  6 each Topical Q2200   cloNIDine   0.3 mg Transdermal Weekly   enoxaparin (LOVENOX) injection  40 mg Subcutaneous Q24H   feeding supplement (GLUCERNA SHAKE)  237 mL Oral TID BM   insulin  aspart  0-20 Units Subcutaneous Q4H   insulin  glargine-yfgn  25 Units Subcutaneous BID   melatonin  3 mg Oral QHS   OLANZapine  zydis  5 mg Oral QHS   sodium chloride  flush  3 mL Intravenous Q12H   sodium chloride  flush  3 mL Intravenous Q12H   thiamine  100 mg Oral Daily   Continuous Infusions:  cefTRIAXone (ROCEPHIN)  IV 2 g (05/17/24 0902)    Nutritional status Signs/Symptoms: NPO status, energy intake < or equal to 50% for > or equal to 5 days Interventions: Refer to RD note for recommendations Body mass index is 30.57 kg/m.  Data  Reviewed:   CBC: Recent Labs  Lab 05/11/24 0327 05/14/24 1703 05/15/24 0424 05/16/24 1217  WBC 10.1 15.8* 16.7* 10.7*  HGB 14.9 15.5 14.5 13.8  HCT 45.7 46.9 43.4 41.0  MCV 91.6 90.4 89.5 88.6  PLT 292 447* 402* 439*   Basic Metabolic Panel: Recent Labs  Lab 05/15/24 0018 05/15/24 0320 05/15/24 0736 05/15/24 1255 05/16/24 0306  NA 134* 135 136 136 135  K 3.3* 3.2* 3.6 3.5 3.6  CL 100 100 103 103 102  CO2 23 24 23 24 22   GLUCOSE 217* 223* 216* 181* 233*  BUN 19 21 20 18 15   CREATININE 0.81 0.77 0.71 0.80 0.56*  CALCIUM  8.8* 8.7* 8.8* 8.6* 8.2*  MG  --   --   --  1.7 2.2  PHOS  --   --   --   --  3.2   GFR:  Estimated Creatinine Clearance: 109.7 mL/min (A) (by C-G formula based on SCr of 0.56 mg/dL (L)). Liver Function Tests: No results for input(s): "AST", "ALT", "ALKPHOS", "BILITOT", "PROT", "ALBUMIN " in the last 168 hours. No results for input(s): "LIPASE", "AMYLASE" in the last 168 hours. Recent Labs  Lab 05/15/24 0424  AMMONIA 22   Coagulation Profile: No results for input(s): "INR", "PROTIME" in the last 168 hours. Cardiac Enzymes: Recent Labs  Lab 05/11/24 0327 05/14/24 1809  CKTOTAL 540* 103   BNP (last 3 results) No results for input(s): "PROBNP" in the last 8760 hours. HbA1C: No results for input(s): "HGBA1C" in the last 72 hours. CBG: Recent Labs  Lab 05/16/24 1555 05/16/24 1957 05/16/24 2333 05/17/24 0402 05/17/24 0727  GLUCAP 245* 168* 156* 136* 106*   Lipid Profile: Recent Labs    05/15/24 0735  CHOL 99  HDL 27*  LDLCALC 50  TRIG 914  CHOLHDL 3.7   Thyroid Function Tests: No results for input(s): "TSH", "T4TOTAL", "FREET4", "T3FREE", "THYROIDAB" in the last 72 hours. Anemia Panel: No results for input(s): "VITAMINB12", "FOLATE", "FERRITIN", "TIBC", "IRON", "RETICCTPCT" in the last 72 hours. Sepsis Labs: Recent Labs  Lab 05/15/24 0424  PROCALCITON 0.12    Recent Results (from the past 240 hours)  MRSA Next Gen by PCR,  Nasal     Status: None   Collection Time: 05/08/24  6:09 PM   Specimen: Nasal Mucosa; Nasal Swab  Result Value Ref Range Status   MRSA by PCR Next Gen NOT DETECTED NOT DETECTED Final    Comment: (NOTE) The GeneXpert MRSA Assay (FDA approved for NASAL specimens only), is one component of a comprehensive MRSA colonization surveillance program. It is not intended to diagnose MRSA infection nor to guide or monitor treatment for MRSA infections. Test performance is not FDA approved in patients less than 28 years old. Performed at [facility], [address] [place] [address], [place], [place] [address]   Culture, blood (Routine X 2) w Reflex to ID Panel     Status: None (Preliminary result)   Collection Time: 05/14/24  5:03 PM   Specimen: BLOOD LEFT HAND  Result Value Ref Range Status   Specimen Description   Final    BLOOD LEFT HAND Performed at [facility], [address] [place], [place] [address]    Special Requests   Final    BOTTLES DRAWN AEROBIC AND ANAEROBIC Blood Culture adequate volume Performed at [facility], [address], [place], [place] [address]    Culture   Final    NO GROWTH 3 DAYS Performed at [facility], [address] [place], [place] [address]    Report Status PENDING  Incomplete  Culture, blood (Routine X 2) w Reflex to ID Panel     Status: None (Preliminary result)   Collection Time: 05/14/24  6:09 PM   Specimen: BLOOD RIGHT HAND  Result Value Ref Range Status   Specimen Description   Final    BLOOD RIGHT HAND Performed at [facility], [address] [place], [place] [address]    Special Requests   Final    BOTTLES DRAWN AEROBIC AND ANAEROBIC Blood Culture adequate volume Performed at [facility], [address] [place], [place] [address]    Culture   Final    NO GROWTH 3 DAYS Performed at [facility], [address] [place], [place] [address]    Report Status PENDING   Incomplete  Radiology Studies: DG Swallowing Func-Speech Pathology Result Date: 05/16/2024 Table formatting from the original result was not included. Modified Barium Swallow Study Patient Details Name: Willie [name] MRN: [id number] Date of Birth: 04/01/1961 Today's Date: 05/16/2024 HPI/PMH: HPI: Patient is a 63 y.o. male with PMH: cognitive impairment (undiagnosed but spouse suspects Lewy body dementia), depression, GAD, chronic pain syndrome, h/o gait instability, reactive airway disease, IDDM-2, hypothyroidism, HLD, cervical stenosis. He underwent ACDF surgery 05/01/24. He presented to the hospital on 05/08/24 with increasing confusion and hallucinations  which seems to get worse at night. His spouse reported that he has had worsening choking of food that occured even prior to ACDF surgery. He has been NPO and swallow evaluation has not been able to be completed due to patient with significant agitation, confusion, requiring 4-point restraints. CT cervical spine showed small amount of patchy, ground glass disease of left upper lobe apex, could be due to PNA, edema or scarring.  Initially was placed on full liquid nectar thick diet but this was changed to n.p.o.  NG tube placement attempted yesterday in IR but was unable to be transited into esophagus. Clinical Impression: Clinical Impression: Patient with minimal dysphagia with primarily decreased tongue base retraction/epiglottic deflection.  Slight delay in oral transiting most notably with liquids with patient benefiting from verbal cues at times to swallow at times.  Pharyngeal swallow notable for mild vallecular more than piriform sinus retention (due to decreased tongue base retraction with impaired epiglottic deflection) without patient awareness.  Cued expectoration not effective to clear vallecular retention however further intake did not significantly add to mild retention.       Patient notably with premature spillage of pure mixed with  secretions to the piriform sinus prior to swallow initiation.  And with initial bolus of pure.  He demonstrated gagging response propelling bolus that was aggregating at vallecular space into oral cavity.  He subsequently was able to swallow reflexively to clear.  Second pured bolus provided with cue for patient to conduct effortful swallow.  Patient demonstrated ability to gather bolus in oral cavity fully to rapidly propel entire bolus through pharynx.  This strategy also resulted in less retention.  Adequate rotary mastication noted with solid patient with functional pharyngeal clearance.       Patient was challenged with sequential boluses of thin without airway compromise but he does tend to feel his mouth full and allow premature spillage prior to swallow trigger.       Barium tablet provided with nectar thick liquid easily transited through oropharynx into esophagus.  Upon esophageal sweep patient appeared clear, MBS does not assess below the PES.  At this time recommend Dysphagia 3/thin diet due to patient's cognition, oral transiting discoordination with liquids and vallecular more than piriform sinus retention (without awareness to retention).   Note pt has had prior esophageal dilatation per chart review.  SLP will follow briefly for dysphagia management, education with family/pt. Factors that may increase risk of adverse event in presence of aspiration Roderick Civatte & Jessy Morocco 2021): Factors that may increase risk of adverse event in presence of aspiration Roderick Civatte & Jessy Morocco 2021): Reduced cognitive function; Limited mobility Recommendations/Plan: Swallowing Evaluation Recommendations Swallowing Evaluation Recommendations Recommendations: PO diet PO Diet Recommendation: Dysphagia 3 (Mechanical soft); Thin liquids (Level 0) Liquid Administration via: Cup; Straw Medication Administration: Other (Comment) (Whole with Ensure, applesauce or ice cream as tolerated.) Supervision: Full supervision/cueing for  swallowing strategies (Full assist initially, especially in light of premorbid dysphagia per prior SLP note) Swallowing  strategies  : Slow rate; Minimize environmental distractions; Small bites/sips Postural changes: Stay upright 30-60 min after meals; Position pt fully upright for meals Oral care recommendations: Oral care BID (2x/day) Treatment Plan Treatment Plan Treatment recommendations: Therapy as outlined in treatment plan below Follow-up recommendations: No SLP follow up Functional status assessment: Patient has had a recent decline in their functional status and demonstrates the ability to make significant improvements in function in a reasonable and predictable amount of time. Treatment frequency: Min 1x/week Treatment duration: 1 week Interventions: Aspiration precaution training; Compensatory techniques; Patient/family education Recommendations Recommendations for follow up therapy are one component of a multi-disciplinary discharge planning process, led by the attending physician.  Recommendations may be updated based on patient status, additional functional criteria and insurance authorization. Assessment: Orofacial Exam: Orofacial Exam Oral Cavity: Oral Hygiene: WFL Oral Cavity - Dentition: Adequate natural dentition Orofacial Anatomy: WFL Oral Motor/Sensory Function: WFL Anatomy: Anatomy: Presence of cervical hardware Boluses Administered: Boluses Administered Boluses Administered: Thin liquids (Level 0); Mildly thick liquids (Level 2, nectar thick); Moderately thick liquids (Level 3, honey thick); Puree; Solid  Oral Impairment Domain: Oral Impairment Domain Lip Closure: No labial escape Tongue control during bolus hold: Cohesive bolus between tongue to palatal seal Bolus preparation/mastication: Timely and efficient chewing and mashing Bolus transport/lingual motion: Delayed initiation of tongue motion (oral holding) Oral residue: Trace residue lining oral structures Location of oral residue :  Tongue Initiation of pharyngeal swallow : Valleculae; Pyriform sinuses  Pharyngeal Impairment Domain: Pharyngeal Impairment Domain Soft palate elevation: No bolus between soft palate (SP)/pharyngeal wall (PW) Laryngeal elevation: Partial superior movement of thyroid cartilage/partial approximation of arytenoids to epiglottic petiole Anterior hyoid excursion: Partial anterior movement Epiglottic movement: Partial inversion Laryngeal vestibule closure: Incomplete, narrow column air/contrast in laryngeal vestibule Pharyngeal contraction (A/P view only): N/A Pharyngoesophageal segment opening: Complete distension and complete duration, no obstruction of flow Tongue base retraction: Narrow column of contrast or air between tongue base and PPW Pharyngeal residue: Trace residue within or on pharyngeal structures Location of pharyngeal residue: Valleculae; Pyriform sinuses  Esophageal Impairment Domain: Esophageal Impairment Domain Esophageal clearance upright position: Complete clearance, esophageal coating Pill: Pill Consistency administered: Mildly thick liquids (Level 2, nectar thick) Mildly thick liquids (Level 2, nectar thick): WFL Penetration/Aspiration Scale Score: Penetration/Aspiration Scale Score 1.  Material does not enter airway: Mildly thick liquids (Level 2, nectar thick); Moderately thick liquids (Level 3, honey thick); Puree; Solid; Pill; Thin liquids (Level 0) 2.  Material enters airway, remains ABOVE vocal cords then ejected out: Thin liquids (Level 0) 3.  Material enters airway, remains ABOVE vocal cords and not ejected out: Thin liquids (Level 0) Compensatory Strategies: Compensatory Strategies Compensatory strategies: Yes Effortful swallow: Effective Other(comment): Effective Effective Other(comment): Thin liquid (Level 0); Mildly thick liquid (Level 2, nectar thick) (cue to swallow)   General Information: Caregiver present: No (RN, Stacy present)  Diet Prior to this Study: NPO   Temperature : Normal    Respiratory Status: WFL   Supplemental O2: None (Room air)   History of Recent Intubation: No  Behavior/Cognition: Alert; Cooperative; Pleasant mood Self-Feeding Abilities: Other (Comment) (Appears to have some weakness with self-feeding over reaching to the left side of his mouth with the right hand) Baseline vocal quality/speech: Normal Volitional Cough: Able to elicit Volitional Swallow: Able to elicit Exam Limitations: No limitations Goal Planning: Prognosis for improved oropharyngeal function: Good Barriers to Reach Goals: Cognitive deficits No data recorded Patient/Family Stated Goal: pt remains confused, pleasant but able to state his  name and birthday Consulted and agree with results and recommendations: Pt unable/family or caregiver not available; Nurse Pain: Pain Assessment Pain Assessment: No/denies pain Breathing: 0 Negative Vocalization: 0 Facial Expression: 0 Body Language: 0 Consolability: 0 PAINAD Score: 0 End of Session: Start Time:No data recorded Stop Time: No data recorded Time Calculation:No data recorded Charges: No data recorded SLP visit diagnosis: SLP Visit Diagnosis: Dysphagia, oropharyngeal phase (R13.12) Past Medical History: Past Medical History: Diagnosis Date  Anxiety   Arthritis   BPH (benign prostatic hyperplasia)   Depression   Diabetes (HCC)   Diabetic neuropathy (HCC)   Diabetic retinopathy (HCC)   Esophageal stricture   Fibromyalgia   History of kidney stones   Hyperlipidemia   Hypertension   Hypothyroidism   Melanoma (HCC)   OSA (obstructive sleep apnea)   Polyneuropathy   Tremor  Past Surgical History: Past Surgical History: Procedure Laterality Date  ANTERIOR CERVICAL DECOMP/DISCECTOMY FUSION N/A 05/01/2024  Procedure: ANTERIOR CERVICAL DECOMPRESSION/DISCECTOMY FUSION  Cervical Five-Cervical Six, remove tether plate Cervical Six-Seven;  Surgeon: Joaquin Mulberry, MD;  Location: [facility] OR;  Service: Neurosurgery;  Laterality: N/A;  ACDF - C3-C4 - C4-C5 - C5-C6, remove tether plate  Z6-0  CARPAL TUNNEL RELEASE Bilateral 2002  CATARACT EXTRACTION    CERVICAL DISC SURGERY  2005  CYST EXCISION  2002  middle finger  CYST EXCISION  2007  left thigh  ESOPHAGEAL DILATION    ROTATOR CUFF REPAIR Left   TRIGGER FINGER RELEASE Bilateral  Chantal Comment 05/16/2024, 11:18 AM   Maudie Sorrow, MS [facility] SLP Acute Rehab Services Office [phone]     DG Fluoro Rm 1-60 Min - No Report Result Date: 05/15/2024 Fluoroscopy was utilized by the requesting physician.  No radiographic interpretation.           LOS: 9 days   Time spent= 35 mins    Maggie Schooner, MD [facility]  If 7PM-7AM, please contact night-coverage  05/17/2024, 11:09 AM

## 2024-05-17 NOTE — Progress Notes (Signed)
 PHARMACY - PHYSICIAN COMMUNICATION CRITICAL VALUE ALERT - BLOOD CULTURE IDENTIFICATION (BCID)  Willie [name] is an 63 y.o. male who presented to [facility] on 05/07/2024 with a chief complaint of confusion  Assessment:  1/4 GPR  Name of physician (or Provider) Contacted: Lorre Rosin  Current antibiotics: CTX  Changes to prescribed antibiotics recommended:  None, probable contaminant  No results found for this or any previous visit. Beau Bound RPh 05/17/2024, 11:45 PM

## 2024-05-17 NOTE — Hospital Course (Addendum)
  63 year old with history of cognitive impairment, DM1, depression, chronic pain, recent C-spine fusion ACDF C5-6 on 5/7 admitted for confusion.  The hospitalist group was called to assess the patient for mission to the hospital.    Initial scan showed some epidural hematoma.  Neurosurgery and neurology was consulted.  Workup overall was unrevealing and recommended supportive care and treatment.    On 5/20 due to worsening blood pressure patient was transferred to ICU for better control on his blood pressure and Cleviprex was started.  Patient was additionally suffering from DKA and also was placed on insulin  drip at that time.  Eventually patient was weaned off the drip and patient was transferred out of the ICU back to the medical floor.    Insulin  drip was additionally weaned off.    On the medical floor, transition to subcutaneous insulin  was complicated by labile blood sugars.  Diabetic coordinator assisted in managing basal bolus insulin  therapy.  Arrangements were eventually made for patient to receive Semglee  20 units twice daily alongside NovoLog  7 units before every meal.  Patient was instructed to not use his insulin  pump for the time being and follow-up closely with his endocrinologist at time of discharge.  Due to patient's persistent weakness, PT/OT initially recommended CIR but due to improving strength they subsequently recommended health services.  Hospital course was complicated by frequent agitation.  A thorough workup was undertaken including EEG, B12, TSH and RPR all of which were unremarkable.  Case was discussed with Dr. Bonnita Buttner with neurology and it was felt that the patient was possibly suffering from Lewy body dementia.  Patient was initiated on atypical antipsychotic agents which were titrated with substantial improvement in symptoms.  Patient was briefly placed on a combination of olanzapine  and risperidone but was later transition to olanzapine  5 mg twice daily prior to  discharge.  Patient clinically improved and was discharged home with home health services in improved and stable condition on 05/22/2024.  Patient and wife were instructed to ensure patient has close follow-up as an outpatient with his PCP, endocrinology and neurology.

## 2024-05-18 DIAGNOSIS — G9341 Metabolic encephalopathy: Secondary | ICD-10-CM | POA: Diagnosis not present

## 2024-05-18 LAB — BASIC METABOLIC PANEL WITH GFR
Anion gap: 8 (ref 5–15)
BUN: 15 mg/dL (ref 8–23)
CO2: 27 mmol/L (ref 22–32)
Calcium: 8.6 mg/dL — ABNORMAL LOW (ref 8.9–10.3)
Chloride: 101 mmol/L (ref 98–111)
Creatinine, Ser: 0.74 mg/dL (ref 0.61–1.24)
GFR, Estimated: >60 mL/min (ref >60)
Glucose, Bld: 151 mg/dL — ABNORMAL HIGH (ref 70–99)
Potassium: 3.3 mmol/L — ABNORMAL LOW (ref 3.5–5.1)
Sodium: 136 mmol/L (ref 135–145)

## 2024-05-18 LAB — GLUCOSE, CAPILLARY
Glucose-Capillary: 69 mg/dL — ABNORMAL LOW (ref 70–99)
Glucose-Capillary: 128 mg/dL — ABNORMAL HIGH (ref 70–99)
Glucose-Capillary: 154 mg/dL — ABNORMAL HIGH (ref 70–99)
Glucose-Capillary: 198 mg/dL — ABNORMAL HIGH (ref 70–99)
Glucose-Capillary: 395 mg/dL — ABNORMAL HIGH (ref 70–99)
Glucose-Capillary: 280 mg/dL — ABNORMAL HIGH (ref 70–99)

## 2024-05-18 LAB — CBC
HCT: 38.2 % — ABNORMAL LOW (ref 39.0–52.0)
Hemoglobin: 12.9 g/dL — ABNORMAL LOW (ref 13.0–17.0)
MCH: 30.1 pg (ref 26.0–34.0)
MCHC: 33.8 g/dL (ref 30.0–36.0)
MCV: 89 fL (ref 80.0–100.0)
Platelets: 464 10*3/uL — ABNORMAL HIGH (ref 150–400)
RBC: 4.29 MIL/uL (ref 4.22–5.81)
RDW: 12.3 % (ref 11.5–15.5)
WBC: 7.8 10*3/uL (ref 4.0–10.5)
nRBC: 0 % (ref 0.0–0.2)

## 2024-05-18 LAB — PHOSPHORUS: Phosphorus: 4.8 mg/dL — ABNORMAL HIGH (ref 2.5–4.6)

## 2024-05-18 LAB — MAGNESIUM: Magnesium: 2.2 mg/dL (ref 1.7–2.4)

## 2024-05-18 MED ORDER — GABAPENTIN 300 MG PO CAPS
300.0000 mg | ORAL_CAPSULE | Freq: Two times a day (BID) | ORAL | Status: DC
  Administered 2024-05-19 – 2024-05-22 (×7): 300 mg via ORAL
  Filled 2024-05-18 (×7): qty 1

## 2024-05-18 MED ORDER — BUSPIRONE HCL 5 MG PO TABS
10.0000 mg | ORAL_TABLET | Freq: Two times a day (BID) | ORAL | Status: DC
  Administered 2024-05-19 – 2024-05-22 (×7): 10 mg via ORAL
  Filled 2024-05-18 (×7): qty 2

## 2024-05-18 MED ORDER — DEXTROSE 50 % IV SOLN: 1.0000 | Freq: Once | INTRAVENOUS | Status: DC

## 2024-05-18 MED ORDER — HALOPERIDOL LACTATE 5 MG/ML IJ SOLN
5.0000 mg | Freq: Once | INTRAMUSCULAR | Status: AC
  Administered 2024-05-18: 5 mg via INTRAVENOUS
  Filled 2024-05-18: qty 1

## 2024-05-18 MED ORDER — INSULIN GLARGINE-YFGN 100 UNIT/ML ~~LOC~~ SOLN
25.0000 [IU] | Freq: Two times a day (BID) | SUBCUTANEOUS | Status: DC
  Filled 2024-05-18: qty 0.25

## 2024-05-18 MED ORDER — HALOPERIDOL LACTATE 5 MG/ML IJ SOLN
5.0000 mg | Freq: Once | INTRAMUSCULAR | Status: DC | PRN
Start: 2024-05-18 — End: 2024-05-22
  Filled 2024-05-18: qty 1

## 2024-05-18 MED ORDER — POTASSIUM CHLORIDE CRYS ER 20 MEQ PO TBCR
40.0000 meq | EXTENDED_RELEASE_TABLET | ORAL | Status: AC
  Administered 2024-05-18 (×2): 40 meq via ORAL
  Filled 2024-05-18 (×2): qty 2

## 2024-05-18 MED ORDER — BUSPIRONE HCL 5 MG PO TABS
10.0000 mg | ORAL_TABLET | Freq: Two times a day (BID) | ORAL | Status: DC
Start: 2024-05-18 — End: 2024-05-18
  Administered 2024-05-18: 10 mg via ORAL
  Filled 2024-05-18: qty 2

## 2024-05-18 MED ORDER — LEVOTHYROXINE SODIUM 112 MCG PO TABS: 112.0000 ug | ORAL_TABLET | Freq: Two times a day (BID) | ORAL | Status: DC

## 2024-05-18 MED ORDER — LEVOTHYROXINE SODIUM 112 MCG PO TABS
112.0000 ug | ORAL_TABLET | Freq: Two times a day (BID) | ORAL | Status: DC
  Administered 2024-05-19 – 2024-05-22 (×7): 112 ug via ORAL
  Filled 2024-05-18 (×7): qty 1

## 2024-05-18 MED ORDER — GABAPENTIN 300 MG PO CAPS
300.0000 mg | ORAL_CAPSULE | Freq: Two times a day (BID) | ORAL | Status: DC
  Administered 2024-05-18: 300 mg via ORAL
  Filled 2024-05-18: qty 1

## 2024-05-18 MED ORDER — MELATONIN 3 MG PO TABS
3.0000 mg | ORAL_TABLET | Freq: Every day | ORAL | Status: DC
  Administered 2024-05-18 – 2024-05-21 (×4): 3 mg via ORAL
  Filled 2024-05-18 (×4): qty 1

## 2024-05-18 MED ORDER — BUPROPION HCL ER (SR) 150 MG PO TB12
150.0000 mg | ORAL_TABLET | Freq: Two times a day (BID) | ORAL | Status: DC
  Administered 2024-05-18: 150 mg via ORAL
  Filled 2024-05-18 (×2): qty 1

## 2024-05-18 MED ORDER — BUPROPION HCL ER (SR) 150 MG PO TB12
150.0000 mg | ORAL_TABLET | Freq: Two times a day (BID) | ORAL | Status: DC
  Administered 2024-05-19 – 2024-05-22 (×7): 150 mg via ORAL
  Filled 2024-05-18 (×7): qty 1

## 2024-05-18 MED ORDER — OLANZAPINE 5 MG PO TBDP
5.0000 mg | ORAL_TABLET | Freq: Every day | ORAL | Status: DC
  Administered 2024-05-18 – 2024-05-21 (×4): 5 mg via ORAL
  Filled 2024-05-18 (×4): qty 1

## 2024-05-18 NOTE — TOC Progression Note (Signed)
 Transition of Care [facility]) - Progression Note    Patient Details  Name: Willie [name] MRN: [id number] Date of Birth: 1961-12-25  Transition of Care [facility]) CM/SW Contact  Levie Ream, RN Phone Number: 05/18/2024, 5:20 PM  Clinical Narrative:    Eldora Greet w/ pt's wife Abran Abrahams, and Jennie Moeller, RN; explained PT's recc is now HHPT w/ RW; she verbalized understanding, but says at this time she can not take him home due to his behavior; Mrs Fugate says she is working on a caregiver at night if pt d/c's home vs going to memory care; she also says pt has appt w/ memory care on 5/28; she has been unsuccessful in obtaining an appt at an earlier date; Mrs Fitzsimmons says she would like to visit a facility near their home tomorrow; Dr Ariel Begun notified via secure chat; Hillary Lowing, [facility] supervisor also notified; [facility] is following   Expected Discharge Plan: Skilled Nursing Facility Barriers to Discharge: Continued Medical Work up, Awaiting State Approval (PASRR), Insurance Authorization  Expected Discharge Plan and Services In-house Referral: Clinical Social Work Discharge Planning Services: CM Consult   Living arrangements for the past 2 months: Single Family Home                 DME Arranged: N/A DME Agency: NA                   Social Determinants of Health (SDOH) Interventions SDOH Screenings   Food Insecurity: No Food Insecurity (05/16/2024)  Housing: Low Risk  (05/16/2024)  Transportation Needs: No Transportation Needs (05/16/2024)  Utilities: Not At Risk (05/16/2024)  Tobacco Use: Medium Risk (05/08/2024)    Readmission Risk Interventions    05/16/2024    2:42 PM  Readmission Risk Prevention Plan  Transportation Screening Complete  PCP or Specialist Appt within 5-7 Days Complete  Home Care Screening Complete  Medication Review (RN CM) Complete

## 2024-05-18 NOTE — Plan of Care (Signed)
  Problem: Metabolic: Goal: Ability to maintain appropriate glucose levels will improve Outcome: Progressing   Problem: Health Behavior/Discharge Planning: Goal: Ability to manage health-related needs will improve Outcome: Progressing   Problem: Fluid Volume: Goal: Ability to maintain a balanced intake and output will improve Outcome: Progressing   Problem: Cardiac: Goal: Ability to maintain an adequate cardiac output will improve Outcome: Progressing

## 2024-05-18 NOTE — Progress Notes (Signed)
 PROGRESS NOTE    Willie [name]  VHQ:[id number] DOB: 08-14-1961 DOA: 05/07/2024 PCP: Rosslyn Coons, MD    Brief Narrative:  63 year old with history of cognitive impairment, DM1, depression, chronic pain, recent C-spine fusion ACDF C5-6 on 5/7 admitted for confusion.  Initial scan showed some epidural hematoma.  Neurosurgery and neurology was consulted.  Workup overall was unrevealing and recommended supportive care and treatment.  On 5/20 due to worsening blood pressure patient was transferred to ICU for better control on his blood pressure and Cleviprex was started.  Also was placed on insulin  drip at that time.  Eventually patient was weaned off the drip.    Insulin  drip was weaned off.  PT/OT eventually recommended CIR but may go home with home health services  He initially progressed with speech and swallow services and his D3 diet was transition to regular.   Assessment & Plan:  Principal Problem:   Acute metabolic encephalopathy Active Problems:   Worsening short-term memory loss   Epidural hematoma (HCC)   Cervical spinal stenosis s/p fusion 5/7   Hyperlipidemia   Generalized anxiety disorder   Chronic depression   Chronic pain syndrome   History of gait instability   Hypothyroidism   Mild cognitive impairment   Insulin  dependent type 2 diabetes mellitus (HCC)   History of choking   Altered mental status, unspecified   DKA (diabetic ketoacidosis) (HCC)    Acute delirium with agitation - Unfortunately having occasional agitation therefore on Zyprexa      Hypertensive urgency, improved Tachycardia, improved -Slowly improving.  Currently on clonidine  patch 0.3 mg weekly, Coreg  twice daily.  IV as needed     Altered mental status apparently precipitated by recent ACDF Gait instability and h/o falling backwards Possible Lewy body dementia -Appreciate neurology and neurosurgery evaluation  - Per neurology, Dr. Bonnita Buttner, given history of gait instability/disorder remotely,  patient may well have Lewy body type dementia - EEG, B12, TSH and RPR are negative and nonrevealing -Slowly improving, alert and oriented to himself   Intermittent desaturation possibly secondary to apneic episodes Increase secretions per RN Groundglass and LUL on CT Reactive airway disease -Continue DuoNebs as needed, currently no acute wheezing -CT cervical spine showed small amount of patchy groundglass disease in LUL.   DKA with history of DM1 -DKA now has resolved -Semglee , sliding scale and Accu-Cheks.  Further adjust as necessary   Possible epidural abscess Recent ACDF -Patient has been evaluated by Dr. Rochelle Chu who notes that a small epidural abscess is not uncommon after recent ACDF.  He did not feel MRI or further workup is warranted at this time   Dysphagia - Initially was on dysphagia 3 diet but continued to work well with speech and swallow services.  Now he has been transitioned to regular diet.   Hypothyroidism Resume home meds   Anxiety and depression Would restart meds when patient able to take p.o.      Mild protein calorie malnutrition Nutrition Problem: Inadequate oral intake Etiology: lethargy/confusion Signs/Symptoms: NPO status, energy intake < or equal to 50% for > or equal to 5 days Interventions: Refer to RD note for recommendations     Obesity class I Estimated body mass index is 31.6 kg/m as calculated from the following:   Height as of this encounter: 5\' 9"  (1.753 m).   Weight as of this encounter: 97.1 kg.  PT/OT-CIR with potential working on home with home health services. ToC To work with the patient.   DVT prophylaxis: Lovenox  Code Status: Full Code Family Communication: Called his wife Status is: Inpatient Remains inpatient appropriate because: Hopefully transition home with home health services in next couple of days.  Subjective: Occasional agitation overnight Resting comfortably this morning.    Examination:  General  exam: Appears calm and comfortable  Respiratory system: Clear to auscultation. Respiratory effort normal. Cardiovascular system: S1 & S2 heard, RRR. No JVD, murmurs, rubs, gallops or clicks. No pedal edema. Gastrointestinal system: Abdomen is nondistended, soft and nontender. No organomegaly or masses felt. Normal bowel sounds heard. Central nervous system: Alert and oriented. No focal neurological deficits. Extremities: Symmetric 5 x 5 power. Skin: No rashes, lesions or ulcers Psychiatry: Judgement and insight appear normal. Mood & affect appropriate.                Diet Orders (From admission, onward)     Start     Ordered   05/17/24 1625  Diet regular Room service appropriate? Yes with Assist; Fluid consistency: Thin  Diet effective now       Question Answer Comment  Room service appropriate? Yes with Assist   Fluid consistency: Thin      05/17/24 1624            Objective: Vitals:   05/17/24 2326 05/18/24 0359 05/18/24 1100 05/18/24 1154  BP: (!) 155/73 129/79 (!) 158/79 (!) 158/79  Pulse: (!) 104 (!) 103 97 97  Resp: 20 20    Temp: 98.5 F (36.9 C) 98.1 F (36.7 C) 98.1 F (36.7 C) 98.1 F (36.7 C)  TempSrc: Oral Oral Oral Oral  SpO2: 96% 93% 97% 97%  Weight:      Height:        Intake/Output Summary (Last 24 hours) at 05/18/2024 1217 Last data filed at 05/17/2024 1920 Gross per 24 hour  Intake 100 ml  Output 800 ml  Net -700 ml   Filed Weights   05/07/24 1602 05/16/24 0402  Weight: 97.1 kg 93.9 kg    Scheduled Meds:  carvedilol   12.5 mg Oral BID WC   Chlorhexidine  Gluconate Cloth  6 each Topical Q2200   cloNIDine   0.3 mg Transdermal Weekly   dextrose   1 ampule Intravenous Once   enoxaparin (LOVENOX) injection  40 mg Subcutaneous Q24H   feeding supplement  237 mL Oral TID BM   haloperidol  lactate  5 mg Intravenous Once   insulin  aspart  0-20 Units Subcutaneous Q4H   insulin  glargine-yfgn  25 Units Subcutaneous BID   melatonin  3 mg Oral  QHS   multivitamin with minerals  1 tablet Oral Daily   OLANZapine  zydis  5 mg Oral QHS   potassium chloride   40 mEq Oral Q4H   sodium chloride  flush  3 mL Intravenous Q12H   sodium chloride  flush  3 mL Intravenous Q12H   thiamine  100 mg Oral Daily   Continuous Infusions:  cefTRIAXone (ROCEPHIN)  IV Stopped (05/17/24 0932)    Nutritional status Signs/Symptoms: NPO status, energy intake < or equal to 50% for > or equal to 5 days Interventions: Refer to RD note for recommendations Body mass index is 30.57 kg/m.  Data Reviewed:   CBC: Recent Labs  Lab 05/14/24 1703 05/15/24 0424 05/16/24 1217 05/18/24 0623  WBC 15.8* 16.7* 10.7* 7.8  HGB 15.5 14.5 13.8 12.9*  HCT 46.9 43.4 41.0 38.2*  MCV 90.4 89.5 88.6 89.0  PLT 447* 402* 439* 464*   Basic Metabolic Panel: Recent Labs  Lab 05/15/24 0320 05/15/24 0736 05/15/24 1255  05/16/24 0306 05/18/24 0623  NA 135 136 136 135 136  K 3.2* 3.6 3.5 3.6 3.3*  CL 100 103 103 102 101  CO2 24 23 24 22 27   GLUCOSE 223* 216* 181* 233* 151*  BUN 21 20 18 15 15   CREATININE 0.77 0.71 0.80 0.56* 0.74  CALCIUM  8.7* 8.8* 8.6* 8.2* 8.6*  MG  --   --  1.7 2.2 2.2  PHOS  --   --   --  3.2 4.8*   GFR: Estimated Creatinine Clearance: 109.7 mL/min (by C-G formula based on SCr of 0.74 mg/dL). Liver Function Tests: No results for input(s): "AST", "ALT", "ALKPHOS", "BILITOT", "PROT", "ALBUMIN " in the last 168 hours. No results for input(s): "LIPASE", "AMYLASE" in the last 168 hours. Recent Labs  Lab 05/15/24 0424  AMMONIA 22   Coagulation Profile: No results for input(s): "INR", "PROTIME" in the last 168 hours. Cardiac Enzymes: Recent Labs  Lab 05/14/24 1809  CKTOTAL 103   BNP (last 3 results) No results for input(s): "PROBNP" in the last 8760 hours. HbA1C: No results for input(s): "HGBA1C" in the last 72 hours. CBG: Recent Labs  Lab 05/17/24 2324 05/18/24 0357 05/18/24 0435 05/18/24 0730 05/18/24 1205  GLUCAP 189* 69* 128*  154* 198*   Lipid Profile: No results for input(s): "CHOL", "HDL", "LDLCALC", "TRIG", "CHOLHDL", "LDLDIRECT" in the last 72 hours. Thyroid Function Tests: No results for input(s): "TSH", "T4TOTAL", "FREET4", "T3FREE", "THYROIDAB" in the last 72 hours. Anemia Panel: No results for input(s): "VITAMINB12", "FOLATE", "FERRITIN", "TIBC", "IRON", "RETICCTPCT" in the last 72 hours. Sepsis Labs: Recent Labs  Lab 05/15/24 0424  PROCALCITON 0.12    Recent Results (from the past 240 hours)  MRSA Next Gen by PCR, Nasal     Status: None   Collection Time: 05/08/24  6:09 PM   Specimen: Nasal Mucosa; Nasal Swab  Result Value Ref Range Status   MRSA by PCR Next Gen NOT DETECTED NOT DETECTED Final    Comment: (NOTE) The GeneXpert MRSA Assay (FDA approved for NASAL specimens only), is one component of a comprehensive MRSA colonization surveillance program. It is not intended to diagnose MRSA infection nor to guide or monitor treatment for MRSA infections. Test performance is not FDA approved in patients less than 70 years old. Performed at [facility], [address], [place], [place] [address]   Culture, blood (Routine X 2) w Reflex to ID Panel     Status: None (Preliminary result)   Collection Time: 05/14/24  5:03 PM   Specimen: BLOOD LEFT HAND  Result Value Ref Range Status   Specimen Description   Final    BLOOD LEFT HAND Performed at [facility], [address], [place], [place] [address]    Special Requests   Final    BOTTLES DRAWN AEROBIC AND ANAEROBIC Blood Culture adequate volume Performed at [facility], [address] [place], [place] [address]    Culture   Final    NO GROWTH 4 DAYS Performed at [facility], [address], [place], [place] [address]    Report Status PENDING  Incomplete  Culture, blood (Routine X 2) w Reflex to ID Panel     Status: None (Preliminary result)   Collection Time: 05/14/24  6:09 PM    Specimen: BLOOD RIGHT HAND  Result Value Ref Range Status   Specimen Description   Final    BLOOD RIGHT HAND Performed at [facility], [address],  [place], [place] [address]    Special Requests   Final    BOTTLES DRAWN AEROBIC AND ANAEROBIC Blood Culture adequate volume Performed at [facility], [address] [place], [place] [address]    Culture  Setup Time   Final    GRAM POSITIVE RODS AEROBIC BOTTLE ONLY CRITICAL RESULT CALLED TO, READ BACK BY AND VERIFIED WITH: PHARMD E. JACKSON [id number] @ 2322 FH Performed at [facility], [address] [place], [place] [address]    Culture GRAM POSITIVE RODS  Final   Report Status PENDING  Incomplete         Radiology Studies: No results found.         LOS: 10 days   Time spent= 35 mins    Maggie Schooner, MD [facility]  If 7PM-7AM, please contact night-coverage  05/18/2024, 12:17 PM

## 2024-05-18 NOTE — Significant Event (Signed)
 Rapid Response Event Note   Reason for Call :  Patient disoriented & trying to elope.  Initial Focused Assessment:  No hands on assessment to not contribute to agitation. Patient oriented to self; Patient knows "[facility]" for place, but disoriented to surroundings, situation and time. Patient using hospital issued RN phone to talk to wife, but verbalized anger with her for not picking him up.  Interventions:  Just talked to patient. Denece Finger, NP also at bedside and discussed common history of being firemen. This seemed to ease patient's frustrations. Patient sitting in recliner and calm afterwards.  Plan of Care:  Safety sitter ordered but may be limited due to staffing. Bedside RN to administer Haldol  as prescribed. Maintain current level of care at this time.  Consider allowing patient to walk with staff if just restless.  Event Summary:   MD Notified: Denece Finger, NP at bedside Call Time: 2112 Arrival Time: 2112 End Time: 2200  Hobson Luna, RN

## 2024-05-18 NOTE — Progress Notes (Signed)
 Physical Therapy Treatment Patient Details Name: Willie [name] MRN: [id number] DOB: 11/26/1961 Today's Date: 05/18/2024   History of Present Illness Patient 63 year old male presenting to [facility] with AMS on 05/07/2024. S/p ACDF 05/01/2024 developing hallucinations and confusion. W/u pending for dementia, r/o PNA.   PMH includes but is not limited to:  cognitive difficulties, possible MCI at baseline, anxiety, depression, diabetes, diabetic nephropathy and retinopathy, fibromyalgia, hypertension, hyperlipidemia, sleep apnea, documented history of essential tremor    PT Comments  AxO x 2 pleasant.  Needs mod cues to focus on task at hand and for safety.  Hx Dementia and Sundowners.  Spouse present during session and very attentive.   Assisted with amb in hallway. General transfer comment: VC's for proper hand placement to "push up" vs pull up on walker.  VC's for hand placement for a safe controlled stand to sit. General Gait Details: Tolerated a functional distance with light need for walker.  VC's on correct walker to self proximity.  VC's on safety with turns. Pt plans to D/C back home with Spouse when medically cleared.  LPT has rec HH PT and a walker.    If plan is discharge home, recommend the following: A little help with walking and/or transfers;A little help with bathing/dressing/bathroom;Assistance with cooking/housework;Help with stairs or ramp for entrance   Can travel by private vehicle     Yes  Equipment Recommendations  Rolling walker (2 wheels)    Recommendations for Other Services       Precautions / Restrictions Precautions Precautions: Fall;Cervical Other Brace: soft collar brace no longer needed after fusion Restrictions Weight Bearing Restrictions Per Provider Order: No     Mobility  Bed Mobility               General bed mobility comments: OOB in recliner    Transfers Overall transfer level: Needs assistance Equipment used: Rolling walker (2 wheels)   Sit  to Stand: Contact guard assist, Min assist           General transfer comment: VC's for proper hand placement to "push up" vs pull up on walker.  VC's for hand placement for a safe controlled stand to sit.    Ambulation/Gait Ambulation/Gait assistance: Contact guard assist, Min assist Gait Distance (Feet): 255 Feet Assistive device: Rolling walker (2 wheels) Gait Pattern/deviations: Step-to pattern, Decreased stride length, Trunk flexed Gait velocity: decreased     General Gait Details: Tolerated a functional distance with light need for walker.  VC's on correct walker to self proximity.  VC's on safety with turns.   Stairs             Wheelchair Mobility     Tilt Bed    Modified Rankin (Stroke Patients Only)       Balance                                            Communication Communication Communication: No apparent difficulties  Cognition Arousal: Alert Behavior During Therapy: Flat affect   PT - Cognitive impairments: Orientation, Awareness, Attention, Problem solving, Safety/Judgement, Memory   Orientation impairments: Time, Situation                   PT - Cognition Comments: Needs mod cues to focus on task at hand and for safety.  Hx Dementia Following commands: Impaired Following commands impaired: Follows  one step commands with increased time    Cueing Cueing Techniques: Verbal cues, Tactile cues  Exercises      General Comments        Pertinent Vitals/Pain Pain Assessment Pain Assessment: No/denies pain    Home Living                          Prior Function            PT Goals (current goals can now be found in the care plan section) Progress towards PT goals: Progressing toward goals    Frequency    Min 2X/week      PT Plan      Co-evaluation              AM-PAC PT "6 Clicks" Mobility   Outcome Measure  Help needed turning from your back to your side while in a flat bed  without using bedrails?: A Little Help needed moving from lying on your back to sitting on the side of a flat bed without using bedrails?: A Little Help needed moving to and from a bed to a chair (including a wheelchair)?: A Little Help needed standing up from a chair using your arms (e.g., wheelchair or bedside chair)?: A Little Help needed to walk in hospital room?: A Little Help needed climbing 3-5 steps with a railing? : A Little 6 Click Score: 18    End of Session Equipment Utilized During Treatment: Gait belt Activity Tolerance: Patient tolerated treatment well Patient left: with call bell/phone within reach;in chair;with chair alarm set;with family/visitor present Nurse Communication: Mobility status PT Visit Diagnosis: Unsteadiness on feet (R26.81);Other abnormalities of gait and mobility (R26.89);Muscle weakness (generalized) (M62.81);History of falling (Z91.81);Difficulty in walking, not elsewhere classified (R26.2)     Time: 1405-1430 PT Time Calculation (min) (ACUTE ONLY): 25 min  Charges:    $Gait Training: 8-22 mins $Therapeutic Activity: 8-22 mins PT General Charges $$ ACUTE PT VISIT: 1 Visit                     {Dequann Vandervelden  PTA Acute  Rehabilitation Services Office M-F          [phone]

## 2024-05-18 NOTE — Plan of Care (Signed)
  Problem: Safety: Goal: Non-violent Restraint(s) Outcome: Progressing   Problem: Education: Goal: Knowledge of General Education information will improve Description: Including pain rating scale, medication(s)/side effects and non-pharmacologic comfort measures Outcome: Progressing   Problem: Coping: Goal: Level of anxiety will decrease Outcome: Progressing   Problem: Safety: Goal: Ability to remain free from injury will improve Outcome: Progressing

## 2024-05-18 NOTE — TOC Progression Note (Signed)
 Transition of Care [facility]) - Progression Note    Patient Details  Name: Willie [name] MRN: [id number] Date of Birth: 27-Aug-1961  Transition [facility] CM/SW Contact  Levie Ream, RN Phone Number: 05/18/2024, 4:29 PM  Clinical Narrative:    Uploaded 30-day PASRR note as requested by Emmaline Haring at [facility], action ID # [id number].   Expected Discharge Plan: Skilled Nursing Facility Barriers to Discharge: Continued Medical Work up, Awaiting State Approval (PASRR), Insurance Authorization  Expected Discharge Plan and Services In-house Referral: Clinical Social Work Discharge Planning Services: CM Consult   Living arrangements for the past 2 months: Single Family Home                 DME Arranged: N/A DME Agency: NA                   Social Determinants of Health (SDOH) Interventions SDOH Screenings   Food Insecurity: No Food Insecurity (05/16/2024)  Housing: Low Risk  (05/16/2024)  Transportation Needs: No Transportation Needs (05/16/2024)  Utilities: Not At Risk (05/16/2024)  Tobacco Use: Medium Risk (05/08/2024)    Readmission Risk Interventions    05/16/2024    2:42 PM  Readmission Risk Prevention Plan  Transportation Screening Complete  PCP or Specialist Appt within 5-7 Days Complete  Home Care Screening Complete  Medication Review (RN CM) Complete

## 2024-05-19 DIAGNOSIS — G9341 Metabolic encephalopathy: Secondary | ICD-10-CM | POA: Diagnosis not present

## 2024-05-19 LAB — GLUCOSE, CAPILLARY
Glucose-Capillary: 108 mg/dL — ABNORMAL HIGH (ref 70–99)
Glucose-Capillary: 198 mg/dL — ABNORMAL HIGH (ref 70–99)
Glucose-Capillary: 204 mg/dL — ABNORMAL HIGH (ref 70–99)
Glucose-Capillary: 206 mg/dL — ABNORMAL HIGH (ref 70–99)
Glucose-Capillary: 371 mg/dL — ABNORMAL HIGH (ref 70–99)
Glucose-Capillary: 422 mg/dL — ABNORMAL HIGH (ref 70–99)

## 2024-05-19 LAB — BASIC METABOLIC PANEL WITH GFR
Anion gap: 12 (ref 5–15)
BUN: 14 mg/dL (ref 8–23)
CO2: 24 mmol/L (ref 22–32)
Calcium: 8.6 mg/dL — ABNORMAL LOW (ref 8.9–10.3)
Chloride: 100 mmol/L (ref 98–111)
Creatinine, Ser: 0.67 mg/dL (ref 0.61–1.24)
GFR, Estimated: >60 mL/min (ref >60)
Glucose, Bld: 213 mg/dL — ABNORMAL HIGH (ref 70–99)
Potassium: 4.2 mmol/L (ref 3.5–5.1)
Sodium: 136 mmol/L (ref 135–145)

## 2024-05-19 LAB — CULTURE, BLOOD (ROUTINE X 2)
Culture: NO GROWTH
Special Requests: ADEQUATE

## 2024-05-19 LAB — CBC
HCT: 42.5 % (ref 39.0–52.0)
Hemoglobin: 14 g/dL (ref 13.0–17.0)
MCH: 30 pg (ref 26.0–34.0)
MCHC: 32.9 g/dL (ref 30.0–36.0)
MCV: 91 fL (ref 80.0–100.0)
Platelets: 462 10*3/uL — ABNORMAL HIGH (ref 150–400)
RBC: 4.67 MIL/uL (ref 4.22–5.81)
RDW: 12.2 % (ref 11.5–15.5)
WBC: 7 10*3/uL (ref 4.0–10.5)
nRBC: 0 % (ref 0.0–0.2)

## 2024-05-19 LAB — MAGNESIUM: Magnesium: 2.2 mg/dL (ref 1.7–2.4)

## 2024-05-19 MED ORDER — OLANZAPINE 5 MG PO TABS
5.0000 mg | ORAL_TABLET | Freq: Three times a day (TID) | ORAL | Status: DC | PRN
  Administered 2024-05-19 – 2024-05-20 (×2): 5 mg via ORAL
  Filled 2024-05-19 (×2): qty 1

## 2024-05-19 MED ORDER — INSULIN GLARGINE-YFGN 100 UNIT/ML ~~LOC~~ SOLN: 30.0000 [IU] | Freq: Two times a day (BID) | SUBCUTANEOUS | Status: DC

## 2024-05-19 MED ORDER — INSULIN ASPART 100 UNIT/ML IJ SOLN
0.0000 [IU] | Freq: Three times a day (TID) | INTRAMUSCULAR | Status: DC
  Administered 2024-05-19 – 2024-05-20 (×2): 7 [IU] via SUBCUTANEOUS
  Administered 2024-05-20 – 2024-05-21 (×2): 4 [IU] via SUBCUTANEOUS
  Administered 2024-05-21: 3 [IU] via SUBCUTANEOUS
  Administered 2024-05-21: 15 [IU] via SUBCUTANEOUS
  Administered 2024-05-21: 3 [IU] via SUBCUTANEOUS
  Administered 2024-05-22: 11 [IU] via SUBCUTANEOUS
  Administered 2024-05-22: 20 [IU] via SUBCUTANEOUS

## 2024-05-19 MED ORDER — INSULIN GLARGINE-YFGN 100 UNIT/ML ~~LOC~~ SOLN
25.0000 [IU] | Freq: Two times a day (BID) | SUBCUTANEOUS | Status: DC
  Administered 2024-05-19 – 2024-05-21 (×5): 25 [IU] via SUBCUTANEOUS
  Filled 2024-05-19 (×7): qty 0.25

## 2024-05-19 MED ORDER — INSULIN ASPART 100 UNIT/ML IJ SOLN
20.0000 [IU] | Freq: Once | INTRAMUSCULAR | Status: AC
  Administered 2024-05-19: 20 [IU] via SUBCUTANEOUS

## 2024-05-19 MED ORDER — RISPERIDONE 1 MG PO TBDP
1.0000 mg | ORAL_TABLET | Freq: Every day | ORAL | Status: DC
  Administered 2024-05-19 – 2024-05-21 (×3): 1 mg via ORAL
  Filled 2024-05-19 (×3): qty 1

## 2024-05-19 MED ORDER — INSULIN ASPART 100 UNIT/ML IJ SOLN
5.0000 [IU] | Freq: Three times a day (TID) | INTRAMUSCULAR | Status: DC
  Administered 2024-05-19 – 2024-05-21 (×5): 5 [IU] via SUBCUTANEOUS

## 2024-05-19 NOTE — Plan of Care (Signed)
   Problem: Education: Goal: Ability to describe self-care measures that may prevent or decrease complications (Diabetes Survival Skills Education) will improve Outcome: Progressing   Problem: Coping: Goal: Ability to adjust to condition or change in health will improve Outcome: Progressing   Problem: Fluid Volume: Goal: Ability to maintain a balanced intake and output will improve Outcome: Progressing   Problem: Skin Integrity: Goal: Risk for impaired skin integrity will decrease Outcome: Progressing

## 2024-05-19 NOTE — Progress Notes (Signed)
 Physical Therapy Treatment Patient Details Name: Willie [name] MRN: [id number] DOB: Oct 04, 1961 Today's Date: 05/19/2024   History of Present Illness Patient 63 year old male presenting to [facility] with AMS on 05/07/2024. S/p ACDF 05/01/2024 developing hallucinations and confusion. Work up reveals Possible Lewy body dementia and Acute delirium with agitation.   PMH includes but is not limited to:  cognitive difficulties, possible MCI at baseline, anxiety, depression, diabetes, diabetic nephropathy and retinopathy, fibromyalgia, hypertension, hyperlipidemia, sleep apnea, documented history of essential tremor    PT Comments  Pt sitting in recliner visiting with family on arrival to room.  Pt overall CGA for safety as pt requires cues during mobility (hx of cognitive difficulties and possible lewy body dementia).  Pt hopeful for return home at d/c.  Pt would benefit from mobilizing and ambulating with staff (pt and spouse would like pt to get into bathroom more if possible instead of BSC).    If plan is discharge home, recommend the following: A little help with walking and/or transfers;A little help with bathing/dressing/bathroom;Assistance with cooking/housework;Help with stairs or ramp for entrance   Can travel by private vehicle        Equipment Recommendations  Rolling walker (2 wheels)    Recommendations for Other Services       Precautions / Restrictions Precautions Precautions: Fall;Cervical Other Brace: soft collar brace no longer needed after fusion Restrictions Weight Bearing Restrictions Per Provider Order: No     Mobility  Bed Mobility               General bed mobility comments: OOB in recliner    Transfers Overall transfer level: Needs assistance Equipment used: Rolling walker (2 wheels) Transfers: Sit to/from Stand Sit to Stand: Contact guard assist           General transfer comment: verbal cues for hand placement to self assist     Ambulation/Gait Ambulation/Gait assistance: Contact guard assist Gait Distance (Feet): 400 Feet Assistive device: Rolling walker (2 wheels) Gait Pattern/deviations: Decreased stride length, Trunk flexed, Step-through pattern Gait velocity: decreased     General Gait Details: cues for RW positioning and taking "big steps", pt able to start/stop; paused multiple times to talk and look around (which pt turns trunk with head and limits cervical rotation without cues)   Stairs             Wheelchair Mobility     Tilt Bed    Modified Rankin (Stroke Patients Only)       Balance Overall balance assessment: Needs assistance         Standing balance support: Bilateral upper extremity supported Standing balance-Leahy Scale: Poor                              Communication Communication Communication: No apparent difficulties  Cognition Arousal: Alert Behavior During Therapy: Flat affect   PT - Cognitive impairments: Orientation, Awareness, Attention, Problem solving, Safety/Judgement, Memory   Orientation impairments: Time, Situation                   PT - Cognition Comments: Needs mod cues to focus on task at hand and for safety.  Hx Dementia Following commands: Impaired Following commands impaired: Follows one step commands with increased time    Cueing Cueing Techniques: Verbal cues, Tactile cues  Exercises      General Comments        Pertinent Vitals/Pain Pain Assessment Pain Assessment:  No/denies pain    Home Living                          Prior Function            PT Goals (current goals can now be found in the care plan section) Progress towards PT goals: Progressing toward goals    Frequency    Min 2X/week      PT Plan      Co-evaluation              AM-PAC PT "6 Clicks" Mobility   Outcome Measure  Help needed turning from your back to your side while in a flat bed without using bedrails?:  A Little Help needed moving from lying on your back to sitting on the side of a flat bed without using bedrails?: A Little Help needed moving to and from a bed to a chair (including a wheelchair)?: A Little Help needed standing up from a chair using your arms (e.g., wheelchair or bedside chair)?: A Little Help needed to walk in hospital room?: A Little Help needed climbing 3-5 steps with a railing? : A Little 6 Click Score: 18    End of Session Equipment Utilized During Treatment: Gait belt Activity Tolerance: Patient tolerated treatment well Patient left: in chair;with call bell/phone within reach;with chair alarm set;with family/visitor present Nurse Communication: Mobility status PT Visit Diagnosis: Other abnormalities of gait and mobility (R26.89);Unsteadiness on feet (R26.81)     Time: 1610-9604 PT Time Calculation (min) (ACUTE ONLY): 23 min  Charges:    $Gait Training: 8-22 mins PT General Charges $$ ACUTE PT VISIT: 1 Visit                     Henretta Lodge PT, DPT Physical Therapist Acute Rehabilitation Services Office: [phone]    Myna Asal Payson 05/19/2024, 3:15 PM

## 2024-05-19 NOTE — Progress Notes (Signed)
 PROGRESS NOTE    Willie [name]  ZOX:[id number] DOB: 03-16-1961 DOA: 05/07/2024 PCP: Rosslyn Coons, MD    Brief Narrative:  63 year old with history of cognitive impairment, DM1, depression, chronic pain, recent C-spine fusion ACDF C5-6 on 5/7 admitted for confusion.  Initial scan showed some epidural hematoma.  Neurosurgery and neurology was consulted.  Workup overall was unrevealing and recommended supportive care and treatment.  On 5/20 due to worsening blood pressure patient was transferred to ICU for better control on his blood pressure and Cleviprex was started.  Also was placed on insulin  drip at that time.  Eventually patient was weaned off the drip.    Insulin  drip was weaned off.  PT/OT eventually recommended CIR but may go home with home health services  He initially progressed with speech and swallow services and his D3 diet was transition to regular.   Assessment & Plan:  Principal Problem:   Acute metabolic encephalopathy Active Problems:   Worsening short-term memory loss   Epidural hematoma (HCC)   Cervical spinal stenosis s/p fusion 5/7   Hyperlipidemia   Generalized anxiety disorder   Chronic depression   Chronic pain syndrome   History of gait instability   Hypothyroidism   Mild cognitive impairment   Insulin  dependent type 2 diabetes mellitus (HCC)   History of choking   Altered mental status, unspecified   DKA (diabetic ketoacidosis) (HCC)    Acute delirium with agitation - Intermittent.  Getting bedtime Zyprexa , add Risperdal and additional as needed Zyprexa .     Hypertensive urgency, improved Tachycardia, improved -Improved and stable.  Currently on clonidine  patch 0.3 mg weekly, Coreg  twice daily.  IV as needed     Altered mental status apparently precipitated by recent ACDF Gait instability and h/o falling backwards Possible Lewy body dementia -Appreciate neurology and neurosurgery evaluation  - Per neurology, Dr. Bonnita Buttner, given history of gait  instability/disorder remotely, patient may well have Lewy body type dementia - EEG, B12, TSH and RPR are negative and nonrevealing -Slowly improving, alert and oriented to himself   Intermittent desaturation possibly secondary to apneic episodes Increase secretions per RN Groundglass and LUL on CT Reactive airway disease -Continue DuoNebs as needed, currently no acute wheezing -CT cervical spine showed small amount of patchy groundglass disease in LUL.   DKA with history of DM1 -DKA now has resolved -Semglee , sliding scale and Accu-Cheks.  Further adjust as necessary   Possible epidural abscess Recent ACDF -Patient has been evaluated by Dr. Rochelle Chu who notes that a small epidural abscess is not uncommon after recent ACDF.  He did not feel MRI or further workup is warranted at this time   Dysphagia - Initially was on dysphagia 3 diet but continued to work well with speech and swallow services.  Now he has been transitioned to regular diet.   Hypothyroidism Resume home meds   Anxiety and depression Would restart meds when patient able to take p.o.      Mild protein calorie malnutrition Nutrition Problem: Inadequate oral intake Etiology: lethargy/confusion Signs/Symptoms: NPO status, energy intake < or equal to 50% for > or equal to 5 days Interventions: Refer to RD note for recommendations     Obesity class I Estimated body mass index is 31.6 kg/m as calculated from the following:   Height as of this encounter: 5\' 9"  (1.753 m).   Weight as of this encounter: 97.1 kg.  PT/OT-working on placement for safe disposition  DVT prophylaxis: Lovenox     Code Status: Full  Code Family Communication: Called his wife Status is: Inpatient Remains inpatient appropriate because: Working on placement for safe disposition  Subjective: Occasional agitation overnight This morning overall appears comfortable but still pleasantly confused  Examination:  General exam: Appears calm and  comfortable  Respiratory system: Clear to auscultation. Respiratory effort normal. Cardiovascular system: S1 & S2 heard, RRR. No JVD, murmurs, rubs, gallops or clicks. No pedal edema. Gastrointestinal system: Abdomen is nondistended, soft and nontender. No organomegaly or masses felt. Normal bowel sounds heard. Central nervous system: Alert and oriented. No focal neurological deficits. Extremities: Symmetric 5 x 5 power. Skin: No rashes, lesions or ulcers Psychiatry: Judgement and insight appear poor                Diet Orders (From admission, onward)     Start     Ordered   05/17/24 1625  Diet regular Room service appropriate? Yes with Assist; Fluid consistency: Thin  Diet effective now       Question Answer Comment  Room service appropriate? Yes with Assist   Fluid consistency: Thin      05/17/24 1624            Objective: Vitals:   05/18/24 1100 05/18/24 1621 05/18/24 1937 05/19/24 0600  BP: (!) 158/79 132/73 105/63 100/70  Pulse: 97 89 91   Resp:   18 16  Temp: 98.1 F (36.7 C) 98.1 F (36.7 C) 98.9 F (37.2 C) 98.3 F (36.8 C)  TempSrc: Oral Oral Oral   SpO2: 97% 100% 95% 96%  Weight:      Height:        Intake/Output Summary (Last 24 hours) at 05/19/2024 0938 Last data filed at 05/19/2024 0900 Gross per 24 hour  Intake 350 ml  Output 2025 ml  Net -1675 ml   Filed Weights   05/07/24 1602 05/16/24 0402  Weight: 97.1 kg 93.9 kg    Scheduled Meds:  buPROPion   150 mg Oral Q12H   busPIRone   10 mg Oral Q12H   carvedilol   12.5 mg Oral BID WC   Chlorhexidine  Gluconate Cloth  6 each Topical Q2200   cloNIDine   0.3 mg Transdermal Weekly   dextrose   1 ampule Intravenous Once   enoxaparin (LOVENOX) injection  40 mg Subcutaneous Q24H   feeding supplement  237 mL Oral TID BM   gabapentin   300 mg Oral Q12H   insulin  aspart  0-20 Units Subcutaneous Q4H   insulin  glargine-yfgn  25 Units Subcutaneous Q12H   levothyroxine   112 mcg Oral Q12H   melatonin  3  mg Oral q1800   multivitamin with minerals  1 tablet Oral Daily   OLANZapine  zydis  5 mg Oral q1800   risperiDONE  1 mg Oral QHS   sodium chloride  flush  3 mL Intravenous Q12H   sodium chloride  flush  3 mL Intravenous Q12H   thiamine  100 mg Oral Daily   Continuous Infusions:  Nutritional status Signs/Symptoms: NPO status, energy intake < or equal to 50% for > or equal to 5 days Interventions: Refer to RD note for recommendations Body mass index is 30.57 kg/m.  Data Reviewed:   CBC: Recent Labs  Lab 05/14/24 1703 05/15/24 0424 05/16/24 1217 05/18/24 0623 05/19/24 0528  WBC 15.8* 16.7* 10.7* 7.8 7.0  HGB 15.5 14.5 13.8 12.9* 14.0  HCT 46.9 43.4 41.0 38.2* 42.5  MCV 90.4 89.5 88.6 89.0 91.0  PLT 447* 402* 439* 464* 462*   Basic Metabolic Panel: Recent Labs  Lab 05/15/24  1610 05/15/24 1255 05/16/24 0306 05/18/24 0623 05/19/24 0528  NA 136 136 135 136 136  K 3.6 3.5 3.6 3.3* 4.2  CL 103 103 102 101 100  CO2 23 24 22 27 24   GLUCOSE 216* 181* 233* 151* 213*  BUN 20 18 15 15 14   CREATININE 0.71 0.80 0.56* 0.74 0.67  CALCIUM  8.8* 8.6* 8.2* 8.6* 8.6*  MG  --  1.7 2.2 2.2 2.2  PHOS  --   --  3.2 4.8*  --    GFR: Estimated Creatinine Clearance: 109.7 mL/min (by C-G formula based on SCr of 0.67 mg/dL). Liver Function Tests: No results for input(s): "AST", "ALT", "ALKPHOS", "BILITOT", "PROT", "ALBUMIN " in the last 168 hours. No results for input(s): "LIPASE", "AMYLASE" in the last 168 hours. Recent Labs  Lab 05/15/24 0424  AMMONIA 22   Coagulation Profile: No results for input(s): "INR", "PROTIME" in the last 168 hours. Cardiac Enzymes: Recent Labs  Lab 05/14/24 1809  CKTOTAL 103   BNP (last 3 results) No results for input(s): "PROBNP" in the last 8760 hours. HbA1C: No results for input(s): "HGBA1C" in the last 72 hours. CBG: Recent Labs  Lab 05/18/24 1615 05/18/24 1944 05/19/24 0036 05/19/24 0425 05/19/24 0754  GLUCAP 395* 280* 206* 198* 371*    Lipid Profile: No results for input(s): "CHOL", "HDL", "LDLCALC", "TRIG", "CHOLHDL", "LDLDIRECT" in the last 72 hours. Thyroid Function Tests: No results for input(s): "TSH", "T4TOTAL", "FREET4", "T3FREE", "THYROIDAB" in the last 72 hours. Anemia Panel: No results for input(s): "VITAMINB12", "FOLATE", "FERRITIN", "TIBC", "IRON", "RETICCTPCT" in the last 72 hours. Sepsis Labs: Recent Labs  Lab 05/15/24 0424  PROCALCITON 0.12    Recent Results (from the past 240 hours)  Culture, blood (Routine X 2) w Reflex to ID Panel     Status: None   Collection Time: 05/14/24  5:03 PM   Specimen: BLOOD LEFT HAND  Result Value Ref Range Status   Specimen Description   Final    BLOOD LEFT HAND Performed at [facility], [address] [place], [place] [address]    Special Requests   Final    BOTTLES DRAWN AEROBIC AND ANAEROBIC Blood Culture adequate volume Performed at [facility], [address] [place], [place] [address]    Culture   Final    NO GROWTH 5 DAYS Performed at [facility], [address], [place], [place] [address]    Report Status 05/19/2024 FINAL  Final  Culture, blood (Routine X 2) w Reflex to ID Panel     Status: None (Preliminary result)   Collection Time: 05/14/24  6:09 PM   Specimen: BLOOD RIGHT HAND  Result Value Ref Range Status   Specimen Description   Final    BLOOD RIGHT HAND Performed at [facility], [address] [place], [place] [address]    Special Requests   Final    BOTTLES DRAWN AEROBIC AND ANAEROBIC Blood Culture adequate volume Performed at [facility], [address] [place], [place] [address]    Culture  Setup Time   Final    GRAM POSITIVE RODS AEROBIC BOTTLE ONLY CRITICAL RESULT CALLED TO, READ BACK BY AND VERIFIED WITH: PHARMD E. JACKSON [id number] @ 2322 FH    Culture   Final    GRAM POSITIVE RODS CULTURE REINCUBATED FOR BETTER GROWTH Performed at [facility], [address], [place], [place] [address]    Report Status PENDING  Incomplete  Radiology Studies: No results found.         LOS: 11 days   Time spent= 35 mins    Maggie Schooner, MD [facility]  If 7PM-7AM, please contact night-coverage  05/19/2024, 9:38 AM

## 2024-05-19 NOTE — Plan of Care (Signed)
  Problem: Coping: Goal: Ability to adjust to condition or change in health will improve Outcome: Progressing   Problem: Nutritional: Goal: Maintenance of adequate nutrition will improve Outcome: Progressing   Problem: Clinical Measurements: Goal: Diagnostic test results will improve Outcome: Progressing   Problem: Activity: Goal: Risk for activity intolerance will decrease Outcome: Progressing   Problem: Nutrition: Goal: Adequate nutrition will be maintained Outcome: Progressing   Problem: Pain Managment: Goal: General experience of comfort will improve and/or be controlled Outcome: Progressing   Problem: Safety: Goal: Ability to remain free from injury will improve Outcome: Progressing

## 2024-05-20 ENCOUNTER — Inpatient Hospital Stay (HOSPITAL_COMMUNITY)

## 2024-05-20 DIAGNOSIS — G9341 Metabolic encephalopathy: Secondary | ICD-10-CM | POA: Diagnosis not present

## 2024-05-20 LAB — BASIC METABOLIC PANEL WITH GFR
Anion gap: 7 (ref 5–15)
BUN: 13 mg/dL (ref 8–23)
CO2: 28 mmol/L (ref 22–32)
Calcium: 8.8 mg/dL — ABNORMAL LOW (ref 8.9–10.3)
Chloride: 100 mmol/L (ref 98–111)
Creatinine, Ser: 0.74 mg/dL (ref 0.61–1.24)
GFR, Estimated: >60 mL/min (ref >60)
Glucose, Bld: 134 mg/dL — ABNORMAL HIGH (ref 70–99)
Potassium: 4 mmol/L (ref 3.5–5.1)
Sodium: 135 mmol/L (ref 135–145)

## 2024-05-20 LAB — CBC
HCT: 44.8 % (ref 39.0–52.0)
Hemoglobin: 14.5 g/dL (ref 13.0–17.0)
MCH: 29.7 pg (ref 26.0–34.0)
MCHC: 32.4 g/dL (ref 30.0–36.0)
MCV: 91.8 fL (ref 80.0–100.0)
Platelets: 472 10*3/uL — ABNORMAL HIGH (ref 150–400)
RBC: 4.88 MIL/uL (ref 4.22–5.81)
RDW: 12.3 % (ref 11.5–15.5)
WBC: 7.8 10*3/uL (ref 4.0–10.5)
nRBC: 0 % (ref 0.0–0.2)

## 2024-05-20 LAB — GLUCOSE, CAPILLARY
Glucose-Capillary: 100 mg/dL — ABNORMAL HIGH (ref 70–99)
Glucose-Capillary: 171 mg/dL — ABNORMAL HIGH (ref 70–99)
Glucose-Capillary: 208 mg/dL — ABNORMAL HIGH (ref 70–99)
Glucose-Capillary: 213 mg/dL — ABNORMAL HIGH (ref 70–99)
Glucose-Capillary: 434 mg/dL — ABNORMAL HIGH (ref 70–99)
Glucose-Capillary: 82 mg/dL (ref 70–99)

## 2024-05-20 LAB — MAGNESIUM: Magnesium: 2.2 mg/dL (ref 1.7–2.4)

## 2024-05-20 MED ORDER — INSULIN ASPART 100 UNIT/ML IJ SOLN
20.0000 [IU] | Freq: Once | INTRAMUSCULAR | Status: AC
  Administered 2024-05-20: 20 [IU] via SUBCUTANEOUS

## 2024-05-20 NOTE — Progress Notes (Signed)
 Mobility Specialist - Progress Note   05/20/24 1229  Mobility  Activity Ambulated with assistance in hallway  Level of Assistance Contact guard assist, steadying assist  Assistive Device Front wheel walker  Distance Ambulated (ft) 200 ft  Range of Motion/Exercises Active  Activity Response Tolerated well  Mobility Referral Yes  Mobility visit 1 Mobility  Mobility Specialist Start Time (ACUTE ONLY) 1215  Mobility Specialist Stop Time (ACUTE ONLY) 1229  Mobility Specialist Time Calculation (min) (ACUTE ONLY) 14 min   Pt was found on recliner chair and agreeable to ambulate. No complaints with session and returned to recliner chair with all needs met. Call bell in reach and RN in room.  Lorna Rose,  Mobility Specialist Can be reached via Secure Chat

## 2024-05-20 NOTE — Progress Notes (Signed)
 PROGRESS NOTE    Willie [name]  WUJ:[id number] DOB: [birth year] DOA: 05/07/2024 PCP: Rosslyn Coons, MD    Brief Narrative:  63 year old with history of cognitive impairment, DM1, depression, chronic pain, recent C-spine fusion ACDF C5-6 on 5/7 admitted for confusion.  Initial scan showed some epidural hematoma.  Neurosurgery and neurology was consulted.  Workup overall was unrevealing and recommended supportive care and treatment.  On 5/20 due to worsening blood pressure patient was transferred to ICU for better control on his blood pressure and Cleviprex was started.  Also was placed on insulin  drip at that time.  Eventually patient was weaned off the drip.    Insulin  drip was weaned off.  PT/OT eventually recommended CIR but may go home with home health services  He initially progressed with speech and swallow services and his D3 diet was transition to regular.   Assessment & Plan:  Principal Problem:   Acute metabolic encephalopathy Active Problems:   Worsening short-term memory loss   Epidural hematoma (HCC)   Cervical spinal stenosis s/p fusion 5/7   Hyperlipidemia   Generalized anxiety disorder   Chronic depression   Chronic pain syndrome   History of gait instability   Hypothyroidism   Mild cognitive impairment   Insulin  dependent type 2 diabetes mellitus (HCC)   History of choking   Altered mental status, unspecified   DKA (diabetic ketoacidosis) (HCC)    Acute delirium with agitation - Intermittent.  Getting bedtime Zyprexa , add Risperdal and additional as needed Zyprexa .     Hypertensive urgency, improved Tachycardia, improved -Improved and stable.  Currently on clonidine  patch 0.3 mg weekly, Coreg  twice daily.  IV as needed     Altered mental status apparently precipitated by recent ACDF Gait instability and h/o falling backwards Possible Lewy body dementia -Appreciate neurology and neurosurgery evaluation  - Per neurology, Dr. Bonnita Buttner, given history of gait  instability/disorder remotely, patient may well have Lewy body type dementia - EEG, B12, TSH and RPR are negative and nonrevealing -Slowly improving, alert and oriented to himself   Intermittent desaturation possibly secondary to apneic episodes Increase secretions per RN Groundglass and LUL on CT Reactive airway disease -Continue DuoNebs as needed, currently no acute wheezing -CT cervical spine showed small amount of patchy groundglass disease in LUL.   DKA with history of DM1 -DKA now has resolved -Semglee , sliding scale and Accu-Cheks.  Further adjust as necessary   Possible epidural abscess Recent ACDF -Patient has been evaluated by Dr. Rochelle Chu who notes that a small epidural abscess is not uncommon after recent ACDF.  He did not feel MRI or further workup is warranted at this time   Dysphagia - Initially was on dysphagia 3 diet but continued to work well with speech and swallow services.  Now he has been transitioned to regular diet.   Hypothyroidism Resume home meds   Anxiety and depression Would restart meds when patient able to take p.o.      Mild protein calorie malnutrition Nutrition Problem: Inadequate oral intake Etiology: lethargy/confusion Signs/Symptoms: NPO status, energy intake < or equal to 50% for > or equal to 5 days Interventions: Refer to RD note for recommendations     Obesity class I Estimated body mass index is 31.6 kg/m as calculated from the following:   Height as of this encounter: 5\' 9"  (1.753 m).   Weight as of this encounter: 97.1 kg.  PT/OT-working on placement for safe disposition  DVT prophylaxis: Lovenox     Code Status: Full  Code Family Communication: Called his wife Status is: Inpatient Remains inpatient appropriate because: Working on placement for safe disposition  Subjective: Rested better last night.  Ambulating in the hallway this morning with the help of ability staff  Examination:  General exam: Appears calm and  comfortable  Respiratory system: Clear to auscultation. Respiratory effort normal. Cardiovascular system: S1 & S2 heard, RRR. No JVD, murmurs, rubs, gallops or clicks. No pedal edema. Gastrointestinal system: Abdomen is nondistended, soft and nontender. No organomegaly or masses felt. Normal bowel sounds heard. Central nervous system: Alert and oriented. No focal neurological deficits. Extremities: Symmetric 5 x 5 power. Skin: No rashes, lesions or ulcers Psychiatry: Judgement and insight appear poor                Diet Orders (From admission, onward)     Start     Ordered   05/19/24 1255  Diet Carb Modified Fluid consistency: Thin; Room service appropriate? Yes  Diet effective now       Question Answer Comment  Diet-HS Snack? Nothing   Calorie Level Medium 1600-2000   Fluid consistency: Thin   Room service appropriate? Yes      05/19/24 1254            Objective: Vitals:   05/19/24 1307 05/19/24 1700 05/19/24 1955 05/20/24 0455  BP: 131/83  116/74 (!) 148/80  Pulse: 95  92 100  Resp: 18 18 14 18   Temp: 98.4 F (36.9 C)  98 F (36.7 C) 98.5 F (36.9 C)  TempSrc: Oral  Oral Oral  SpO2: 100%  98% 97%  Weight:      Height:        Intake/Output Summary (Last 24 hours) at 05/20/2024 1120 Last data filed at 05/20/2024 0929 Gross per 24 hour  Intake 240 ml  Output 1970 ml  Net -1730 ml   Filed Weights   05/07/24 1602 05/16/24 0402  Weight: 97.1 kg 93.9 kg    Scheduled Meds:  buPROPion   150 mg Oral Q12H   busPIRone   10 mg Oral Q12H   carvedilol   12.5 mg Oral BID WC   Chlorhexidine  Gluconate Cloth  6 each Topical Q2200   cloNIDine   0.3 mg Transdermal Weekly   dextrose   1 ampule Intravenous Once   enoxaparin (LOVENOX) injection  40 mg Subcutaneous Q24H   feeding supplement  237 mL Oral TID BM   gabapentin   300 mg Oral Q12H   insulin  aspart  0-20 Units Subcutaneous TID AC & HS   insulin  aspart  5 Units Subcutaneous TID WC   insulin  glargine-yfgn  25  Units Subcutaneous Q12H   levothyroxine   112 mcg Oral Q12H   melatonin  3 mg Oral q1800   multivitamin with minerals  1 tablet Oral Daily   OLANZapine  zydis  5 mg Oral q1800   risperiDONE  1 mg Oral QHS   sodium chloride  flush  3 mL Intravenous Q12H   sodium chloride  flush  3 mL Intravenous Q12H   thiamine  100 mg Oral Daily   Continuous Infusions:  Nutritional status Signs/Symptoms: NPO status, energy intake < or equal to 50% for > or equal to 5 days Interventions: Refer to RD note for recommendations Body mass index is 30.57 kg/m.  Data Reviewed:   CBC: Recent Labs  Lab 05/15/24 0424 05/16/24 1217 05/18/24 0623 05/19/24 0528 05/20/24 0446  WBC 16.7* 10.7* 7.8 7.0 7.8  HGB 14.5 13.8 12.9* 14.0 14.5  HCT 43.4 41.0 38.2* 42.5 44.8  MCV 89.5 88.6 89.0 91.0 91.8  PLT 402* 439* 464* 462* 472*   Basic Metabolic Panel: Recent Labs  Lab 05/15/24 1255 05/16/24 0306 05/18/24 0623 05/19/24 0528 05/20/24 0446  NA 136 135 136 136 135  K 3.5 3.6 3.3* 4.2 4.0  CL 103 102 101 100 100  CO2 24 22 27 24 28   GLUCOSE 181* 233* 151* 213* 134*  BUN 18 15 15 14 13   CREATININE 0.80 0.56* 0.74 0.67 0.74  CALCIUM  8.6* 8.2* 8.6* 8.6* 8.8*  MG 1.7 2.2 2.2 2.2 2.2  PHOS  --  3.2 4.8*  --   --    GFR: Estimated Creatinine Clearance: 109.7 mL/min (by C-G formula based on SCr of 0.74 mg/dL). Liver Function Tests: No results for input(s): "AST", "ALT", "ALKPHOS", "BILITOT", "PROT", "ALBUMIN " in the last 168 hours. No results for input(s): "LIPASE", "AMYLASE" in the last 168 hours. Recent Labs  Lab 05/15/24 0424  AMMONIA 22   Coagulation Profile: No results for input(s): "INR", "PROTIME" in the last 168 hours. Cardiac Enzymes: Recent Labs  Lab 05/14/24 1809  CKTOTAL 103   BNP (last 3 results) No results for input(s): "PROBNP" in the last 8760 hours. HbA1C: No results for input(s): "HGBA1C" in the last 72 hours. CBG: Recent Labs  Lab 05/19/24 2118 05/20/24 0048  05/20/24 0405 05/20/24 0736 05/20/24 1115  GLUCAP 108* 82 100* 208* 434*   Lipid Profile: No results for input(s): "CHOL", "HDL", "LDLCALC", "TRIG", "CHOLHDL", "LDLDIRECT" in the last 72 hours. Thyroid Function Tests: No results for input(s): "TSH", "T4TOTAL", "FREET4", "T3FREE", "THYROIDAB" in the last 72 hours. Anemia Panel: No results for input(s): "VITAMINB12", "FOLATE", "FERRITIN", "TIBC", "IRON", "RETICCTPCT" in the last 72 hours. Sepsis Labs: Recent Labs  Lab 05/15/24 0424  PROCALCITON 0.12    Recent Results (from the past 240 hours)  Culture, blood (Routine X 2) w Reflex to ID Panel     Status: None   Collection Time: 05/14/24  5:03 PM   Specimen: BLOOD LEFT HAND  Result Value Ref Range Status   Specimen Description   Final    BLOOD LEFT HAND Performed at [facility], [address] [place], [place] [address]    Special Requests   Final    BOTTLES DRAWN AEROBIC AND ANAEROBIC Blood Culture adequate volume Performed at [facility], [address], [place], [place] [address]    Culture   Final    NO GROWTH 5 DAYS Performed at [facility], [address] [place], [place] [address]    Report Status 05/19/2024 FINAL  Final  Culture, blood (Routine X 2) w Reflex to ID Panel     Status: None (Preliminary result)   Collection Time: 05/14/24  6:09 PM   Specimen: BLOOD RIGHT HAND  Result Value Ref Range Status   Specimen Description   Final    BLOOD RIGHT HAND Performed at [facility], [address] [place], [place] [address]    Special Requests   Final    BOTTLES DRAWN AEROBIC AND ANAEROBIC Blood Culture adequate volume Performed at [facility], [address], [place], [place] [address]    Culture  Setup Time   Final    GRAM POSITIVE RODS AEROBIC BOTTLE ONLY CRITICAL RESULT CALLED TO, READ BACK BY AND VERIFIED WITH: PHARMD E. JACKSON [id number] @ 2322 FH    Culture   Final    GRAM POSITIVE  RODS IDENTIFICATION TO FOLLOW Performed at [facility], [address] [place], [place] [address]    Report Status PENDING  Incomplete         Radiology Studies: No results found.         LOS: 12 days   Time spent= 35 mins    Maggie Schooner, MD [facility]  If 7PM-7AM, please contact night-coverage  05/20/2024, 11:20 AM

## 2024-05-20 NOTE — Progress Notes (Signed)
 Mobility Specialist - Progress Note   05/20/24 1000  Mobility  Activity Ambulated with assistance in hallway  Level of Assistance Contact guard assist, steadying assist  Assistive Device Front wheel walker  Distance Ambulated (ft) 500 ft  Range of Motion/Exercises Active  Activity Response Tolerated well  Mobility Referral Yes  Mobility visit 1 Mobility  Mobility Specialist Start Time (ACUTE ONLY) 1000  Mobility Specialist Stop Time (ACUTE ONLY) 1015  Mobility Specialist Time Calculation (min) (ACUTE ONLY) 15 min   Pt was found in bed and agreeable to ambulate. Requires safety cues throughout session. At EOS returned to recliner chair with all needs met. Call bell in reach. Chair alarm on.  Lorna Rose,  Mobility Specialist Can be reached via Secure Chat

## 2024-05-20 NOTE — Plan of Care (Signed)
  Problem: Safety: Goal: Non-violent Restraint(s) Outcome: Progressing   Problem: Activity: Goal: Risk for activity intolerance will decrease Outcome: Progressing   Problem: Coping: Goal: Level of anxiety will decrease Outcome: Progressing   Problem: Safety: Goal: Ability to remain free from injury will improve Outcome: Progressing

## 2024-05-21 ENCOUNTER — Encounter: Payer: Self-pay | Admitting: *Deleted

## 2024-05-21 DIAGNOSIS — G9341 Metabolic encephalopathy: Secondary | ICD-10-CM | POA: Diagnosis not present

## 2024-05-21 LAB — MAGNESIUM: Magnesium: 2.3 mg/dL (ref 1.7–2.4)

## 2024-05-21 LAB — BASIC METABOLIC PANEL WITH GFR
Anion gap: 6 (ref 5–15)
BUN: 15 mg/dL (ref 8–23)
CO2: 28 mmol/L (ref 22–32)
Calcium: 8.4 mg/dL — ABNORMAL LOW (ref 8.9–10.3)
Chloride: 98 mmol/L (ref 98–111)
Creatinine, Ser: 0.81 mg/dL (ref 0.61–1.24)
GFR, Estimated: >60 mL/min (ref >60)
Glucose, Bld: 104 mg/dL — ABNORMAL HIGH (ref 70–99)
Potassium: 3.7 mmol/L (ref 3.5–5.1)
Sodium: 132 mmol/L — ABNORMAL LOW (ref 135–145)

## 2024-05-21 LAB — GLUCOSE, CAPILLARY
Glucose-Capillary: 137 mg/dL — ABNORMAL HIGH (ref 70–99)
Glucose-Capillary: 145 mg/dL — ABNORMAL HIGH (ref 70–99)
Glucose-Capillary: 176 mg/dL — ABNORMAL HIGH (ref 70–99)
Glucose-Capillary: 185 mg/dL — ABNORMAL HIGH (ref 70–99)
Glucose-Capillary: 325 mg/dL — ABNORMAL HIGH (ref 70–99)
Glucose-Capillary: 65 mg/dL — ABNORMAL LOW (ref 70–99)

## 2024-05-21 LAB — CBC
HCT: 40.6 % (ref 39.0–52.0)
Hemoglobin: 13.2 g/dL (ref 13.0–17.0)
MCH: 29.6 pg (ref 26.0–34.0)
MCHC: 32.5 g/dL (ref 30.0–36.0)
MCV: 91 fL (ref 80.0–100.0)
Platelets: 479 10*3/uL — ABNORMAL HIGH (ref 150–400)
RBC: 4.46 MIL/uL (ref 4.22–5.81)
RDW: 12.5 % (ref 11.5–15.5)
WBC: 8.9 10*3/uL (ref 4.0–10.5)
nRBC: 0 % (ref 0.0–0.2)

## 2024-05-21 MED ORDER — POTASSIUM CHLORIDE CRYS ER 20 MEQ PO TBCR
40.0000 meq | EXTENDED_RELEASE_TABLET | Freq: Once | ORAL | Status: AC
  Administered 2024-05-21: 40 meq via ORAL
  Filled 2024-05-21: qty 2

## 2024-05-21 MED ORDER — INSULIN ASPART 100 UNIT/ML IJ SOLN
7.0000 [IU] | Freq: Three times a day (TID) | INTRAMUSCULAR | Status: DC
  Administered 2024-05-21 – 2024-05-22 (×2): 7 [IU] via SUBCUTANEOUS

## 2024-05-21 MED ORDER — DEXTROSE 50 % IV SOLN
12.5000 g | INTRAVENOUS | Status: AC
  Administered 2024-05-21: 12.5 g via INTRAVENOUS
  Filled 2024-05-21: qty 50

## 2024-05-21 MED ORDER — INSULIN GLARGINE-YFGN 100 UNIT/ML ~~LOC~~ SOLN
20.0000 [IU] | Freq: Two times a day (BID) | SUBCUTANEOUS | Status: DC
  Administered 2024-05-21 – 2024-05-22 (×2): 20 [IU] via SUBCUTANEOUS
  Filled 2024-05-21 (×3): qty 0.2

## 2024-05-21 NOTE — NC FL2 (Signed)
 [place]  MEDICAID FL2 LEVEL OF CARE FORM     IDENTIFICATION  Patient Name: Willie [name] Birthdate: Feb 09, 1961 Sex: male Admission Date (Current Location): 05/07/2024  [facility] and [place] Number:  Producer, television/film/video and Address:  [facility],  [address] [place], [place] [address]      Provider Number: [id number]  Attending Physician Name and Address:  Maggie Schooner, MD  Relative Name and Phone Number:  Mavrik Bynum (spouse) [phone]    Current Level of Care: Hospital Recommended Level of Care: Memory Care Prior Approval Number:    Date Approved/Denied:   PASRR Number:    Discharge Plan: Other (Comment) (Memory care)    Current Diagnoses: Patient Active Problem List   Diagnosis Date Noted   Acute metabolic encephalopathy 05/08/2024   Worsening short-term memory loss 05/08/2024   Epidural hematoma (HCC) 05/08/2024   Chronic pain syndrome 05/08/2024   Cervical spinal stenosis s/p fusion 5/7 05/08/2024   History of gait instability 05/08/2024   Hypothyroidism 05/08/2024   Mild cognitive impairment 05/08/2024   Insulin  dependent type 2 diabetes mellitus (HCC) 05/08/2024   History of choking 05/08/2024   Altered mental status, unspecified 05/08/2024   DKA (diabetic ketoacidosis) (HCC) 05/08/2024   S/P cervical spinal fusion 05/01/2024   PULMONARY SARCOIDOSIS 12/30/2010   COUGH 12/30/2010   OBSTRUCTIVE SLEEP APNEA 05/16/2008   DIABETES, TYPE 1 05/15/2008   Hyperlipidemia 05/15/2008   OBESITY 05/15/2008   Generalized anxiety disorder 05/15/2008   Chronic depression 05/15/2008   HYPERTENSION 05/15/2008   G E R D 05/15/2008    Orientation RESPIRATION BLADDER Height & Weight     Self, Place  Normal Indwelling catheter Weight: 207 lb 0.2 oz (93.9 kg) Height:  5\' 9"  (175.3 cm)  BEHAVIORAL SYMPTOMS/MOOD NEUROLOGICAL BOWEL NUTRITION STATUS      Incontinent Diet (Carb modified diet)  AMBULATORY STATUS COMMUNICATION OF NEEDS Skin   Supervision  Verbally Other (Comment) (erythema bilateral arms; rash on abdomen)                       Personal Care Assistance Level of Assistance  Bathing, Feeding, Dressing Bathing Assistance: Limited assistance Feeding assistance: Limited assistance Dressing Assistance: Limited assistance     Functional Limitations Info  Sight, Hearing, Speech Sight Info: Impaired Hearing Info: Adequate Speech Info: Adequate    SPECIAL CARE FACTORS FREQUENCY                       Contractures Contractures Info: Not present    Additional Factors Info  Code Status, Allergies, Psychotropic, Insulin  Sliding Scale Code Status Info: Full Code Allergies Info: Other, Latex Psychotropic Info: see MAR Insulin  Sliding Scale Info: See MAR       Current Medications (05/21/2024):  This is the current hospital active medication list Current Facility-Administered Medications  Medication Dose Route Frequency Provider Last Rate Last Admin   acetaminophen  (TYLENOL ) tablet 650 mg  650 mg Oral Q6H PRN Josiah Nigh, MD   650 mg at 05/19/24 1939   buPROPion  (WELLBUTRIN  SR) 12 hr tablet 150 mg  150 mg Oral Q12H Amin, Ankit C, MD   150 mg at 05/21/24 5366   busPIRone  (BUSPAR ) tablet 10 mg  10 mg Oral Q12H Amin, Ankit C, MD   10 mg at 05/21/24 4403   carvedilol  (COREG ) tablet 12.5 mg  12.5 mg Oral BID WC Smith, Joshua C, NP   12.5 mg at 05/21/24 0851   Chlorhexidine   Gluconate Cloth 2 % PADS 6 each  6 each Topical Q2200 Chatterjee, Srobona Tublu, MD   6 each at 05/20/24 2221   cloNIDine  (CATAPRES  - Dosed in mg/24 hr) patch 0.3 mg  0.3 mg Transdermal Weekly Fonnie Iba I, MD   0.3 mg at 05/20/24 1234   dextrose  50 % solution 0-50 mL  0-50 mL Intravenous PRN Paliwal, Aditya, MD       dextrose  50 % solution 50 mL  1 ampule Intravenous Once Amin, Ankit C, MD       enoxaparin (LOVENOX) injection 40 mg  40 mg Subcutaneous Q24H Josiah Nigh, MD   40 mg at 05/20/24 2108   feeding supplement (ENSURE ENLIVE /  ENSURE PLUS) liquid 237 mL  237 mL Oral TID BM Amin, Ankit C, MD   237 mL at 05/19/24 1715   gabapentin  (NEURONTIN ) capsule 300 mg  300 mg Oral Q12H Amin, Ankit C, MD   300 mg at 05/21/24 1610   glucagon (human recombinant) (GLUCAGEN) injection 1 mg  1 mg Intravenous PRN Amin, Ankit C, MD       guaiFENesin (ROBITUSSIN) 100 MG/5ML liquid 5 mL  5 mL Oral Q4H PRN Amin, Ankit C, MD       haloperidol  lactate (HALDOL ) injection 5 mg  5 mg Intravenous Once PRN Daniels, James K, NP       hydrALAZINE (APRESOLINE) injection 10 mg  10 mg Intravenous Q6H PRN Rai, Ripudeep K, MD   10 mg at 05/16/24 0234   insulin  aspart (novoLOG ) injection 0-20 Units  0-20 Units Subcutaneous TID AC & HS Amin, Ankit C, MD   15 Units at 05/21/24 1336   insulin  aspart (novoLOG ) injection 5 Units  5 Units Subcutaneous TID WC Amin, Ankit C, MD   5 Units at 05/21/24 1336   insulin  glargine-yfgn (SEMGLEE ) injection 20 Units  20 Units Subcutaneous Q12H Amin, Ankit C, MD       ipratropium-albuterol  (DUONEB) 0.5-2.5 (3) MG/3ML nebulizer solution 3 mL  3 mL Nebulization Q4H PRN Chatterjee, Srobona Tublu, MD       labetalol (NORMODYNE) injection 10 mg  10 mg Intravenous Q2H PRN Paliwal, Aditya, MD   10 mg at 05/16/24 0420   levothyroxine  (SYNTHROID ) tablet 112 mcg  112 mcg Oral Q12H Amin, Ankit C, MD   112 mcg at 05/21/24 0623   melatonin tablet 3 mg  3 mg Oral q1800 Amin, Ankit C, MD   3 mg at 05/20/24 1824   metoprolol  tartrate (LOPRESSOR ) injection 5 mg  5 mg Intravenous Q4H PRN Amin, Ankit C, MD       multivitamin with minerals tablet 1 tablet  1 tablet Oral Daily Amin, Ankit C, MD   1 tablet at 05/21/24 0851   OLANZapine  (ZYPREXA ) tablet 5 mg  5 mg Oral Q8H PRN Amin, Ankit C, MD   5 mg at 05/20/24 0409   OLANZapine  zydis (ZYPREXA ) disintegrating tablet 5 mg  5 mg Oral q1800 Amin, Ankit C, MD   5 mg at 05/20/24 1824   ondansetron  (ZOFRAN ) tablet 4 mg  4 mg Oral Q6H PRN Josiah Nigh, MD   4 mg at 05/18/24 1826   Or   ondansetron   (ZOFRAN ) injection 4 mg  4 mg Intravenous Q6H PRN Josiah Nigh, MD   4 mg at 05/17/24 1300   Oral care mouth rinse  15 mL Mouth Rinse PRN Alden Humphrey Tublu, MD   15 mL at 05/09/24 9604   phenylephrine  ((USE for  PREPARATION-H)) 0.25 % suppository 1 suppository  1 suppository Rectal BID PRN Amin, Ankit C, MD   1 suppository at 05/19/24 1253   pneumococcal 20-valent conjugate vaccine (PREVNAR 20) injection 0.5 mL  0.5 mL Intramuscular Prior to discharge Josiah Nigh, MD       risperiDONE  (RISPERDAL  M-TABS) disintegrating tablet 1 mg  1 mg Oral QHS Amin, Ankit C, MD   1 mg at 05/20/24 2108   senna-docusate (Senokot-S) tablet 1 tablet  1 tablet Oral QHS PRN Amin, Ankit C, MD       sodium chloride  flush (NS) 0.9 % injection 3 mL  3 mL Intravenous Q12H Sundil, Subrina, MD   3 mL at 05/18/24 2153   sodium chloride  flush (NS) 0.9 % injection 3 mL  3 mL Intravenous Q12H Sundil, Subrina, MD   3 mL at 05/21/24 1610   sodium chloride  flush (NS) 0.9 % injection 3 mL  3 mL Intravenous PRN Sundil, Subrina, MD       thiamine  (VITAMIN B1) tablet 100 mg  100 mg Oral Daily Scheryl Cushing, RPH   100 mg at 05/21/24 9604     Discharge Medications: Please see discharge summary for a list of discharge medications.  Relevant Imaging Results:  Relevant Lab Results:   Additional Information SSN: [id number]  Zenon Hilda, LCSW

## 2024-05-21 NOTE — Progress Notes (Signed)
 Physical Therapy Treatment Patient Details Name: Willie [name] MRN: [id number] DOB: 12-19-1961 Today's Date: 05/21/2024   History of Present Illness Patient 63 year old male presenting to [facility] with AMS on 05/07/2024. S/p ACDF 05/01/2024 developing hallucinations and confusion. Work up reveals Possible Lewy body dementia and Acute delirium with agitation.   PMH includes but is not limited to:  cognitive difficulties, possible MCI at baseline, anxiety, depression, diabetes, diabetic nephropathy and retinopathy, fibromyalgia, hypertension, hyperlipidemia, sleep apnea, documented history of essential tremor    PT Comments  Pt was wonder around his room with chair alarm going off.  Pt was at the sink with his cath bag still attatched to recliner.  Pt requires constant Supervison and direction.  Pleasant.  Following all commands.General transfer comment: VC's for safety awareness and Contact Guard for balance instability.  Present with x 3 LOB during session esp with backward stepping and turning.  HIGH FALL RISK. Assisted with amb in hallway. General Gait Details: Contact Guard Assist for balance instability as pt presents with lateral drift and impaired self correction to midline.  Pt presented with x 3 LOB during session once with backward steps and again with turns,  HIGH FALL RISK. Pt plans to D/C back home with Spouse who is his care giver.     If plan is discharge home, recommend the following: A little help with walking and/or transfers;A little help with bathing/dressing/bathroom;Assistance with cooking/housework;Help with stairs or ramp for entrance   Can travel by private vehicle     Yes  Equipment Recommendations  Rolling walker (2 wheels)    Recommendations for Other Services       Precautions / Restrictions Precautions Precautions: Fall Precaution/Restrictions Comments: Reviewed C spine precautions fusion May 5th Restrictions Weight Bearing Restrictions Per Provider Order: No      Mobility  Bed Mobility               General bed mobility comments: OOB    Transfers Overall transfer level: Needs assistance Equipment used: Rolling walker (2 wheels), None Transfers: Sit to/from Stand Sit to Stand: Contact guard assist           General transfer comment: VC's for safety awareness and Contact Guard for balance instability.  Present with x 3 LOB during session esp with backward stepping and turning.  HIGH FALL RISK.    Ambulation/Gait Ambulation/Gait assistance: Contact guard assist Gait Distance (Feet): 225 Feet Assistive device: Rolling walker (2 wheels) Gait Pattern/deviations: Decreased stride length, Trunk flexed, Step-through pattern Gait velocity: decreased     General Gait Details: Contact Guard Assist for balance instability as pt presents with lateral drift and impaired self correction to midline.  Pt presented with x 3 LOB during session once with backward steps and again with turns,  HIGH FALL RISK.   Stairs             Wheelchair Mobility     Tilt Bed    Modified Rankin (Stroke Patients Only)       Balance                                            Communication    Cognition Arousal: Alert Behavior During Therapy: Flat affect   PT - Cognitive impairments: Orientation, Awareness, Attention, Problem solving, Safety/Judgement, Memory  PT - Cognition Comments: Pt was wonder around his room with chair alarm going off.  Pt was at the sink with his cath bag still attatched to recliner.  Pt requires constant Supervison and direction.  Pleasant.  Following all commands. Following commands: Impaired Following commands impaired: Follows one step commands with increased time    Cueing Cueing Techniques: Verbal cues, Tactile cues  Exercises      General Comments        Pertinent Vitals/Pain Pain Assessment Pain Assessment: No/denies pain    Home Living                           Prior Function            PT Goals (current goals can now be found in the care plan section) Progress towards PT goals: Progressing toward goals    Frequency    Min 2X/week      PT Plan      Co-evaluation              AM-PAC PT "6 Clicks" Mobility   Outcome Measure  Help needed turning from your back to your side while in a flat bed without using bedrails?: A Little Help needed moving from lying on your back to sitting on the side of a flat bed without using bedrails?: A Little Help needed moving to and from a bed to a chair (including a wheelchair)?: A Little Help needed standing up from a chair using your arms (e.g., wheelchair or bedside chair)?: A Little Help needed to walk in hospital room?: A Little Help needed climbing 3-5 steps with a railing? : A Little 6 Click Score: 18    End of Session Equipment Utilized During Treatment: Gait belt Activity Tolerance: Patient tolerated treatment well Patient left: in chair;with call bell/phone within reach;with chair alarm set;with family/visitor present Nurse Communication: Mobility status PT Visit Diagnosis: Other abnormalities of gait and mobility (R26.89);Unsteadiness on feet (R26.81)     Time: 0981-1914 PT Time Calculation (min) (ACUTE ONLY): 34 min  Charges:    $Gait Training: 8-22 mins $Therapeutic Activity: 8-22 mins PT General Charges $$ ACUTE PT VISIT: 1 Visit                     Bess Broody  PTA Acute  Rehabilitation Services Office M-F          [phone]

## 2024-05-21 NOTE — Progress Notes (Signed)
 Hypoglycemic Event  CBG: 65  Treatment: D50 25 mL (12.5 gm)  Symptoms: None  Follow-up CBG: Time:0033 CBG Result:185  Possible Reasons for Event: Inadequate meal intake  Comments/MD notified: Laurence Pons, NP     Almira Jaeger

## 2024-05-21 NOTE — TOC Progression Note (Signed)
 Transition of Care [facility]) - Progression Note   Patient Details  Name: Willie [name] MRN: [id number] Date of Birth: [birth year]  Transition of Care [facility]) CM/SW Contact  Zenon Hilda, LCSW Phone Number: 05/21/2024, 2:17 PM  Clinical Narrative: PT evaluation now recommending HH services. Wife requested call back from CSW. CSW spoke with wife who was visiting [facility] as a possible LTC placement. Ms. Elvan Hamel at [facility] requested that an updated FL2 be emailed to her ([email or web address]).  Wife stated she cannot take the patient home until she has placement as it is "unsafe" at home. CSW explained that per discussions last week, the patient would not remain in the hospital while she looks for placement once the patient is medically stable for discharge. Wife repeatedly stated that by discharging the patient, the hospital was "kicking him to the curb." CSW reiterated that per conversations with TOC and the hospitalist last week the patient would remain in the hospital until medically ready and if wife wants to file an appeal at discharge, she can do so per Medicare guidelines.  FL2 updated and emailed to Ms. Wilson. CSW updated treatment team regarding wife's continuous requests to keep the patient in the hospital while she looks for placement. CSW followed back up with Jolan Natal with [employer] to have him follow up with wife to set up home care while wife continues to work on placement.  Expected Discharge Plan: Home w Home Health Services Barriers to Discharge: Family Issues  Expected Discharge Plan and Services In-house Referral: Clinical Social Work Living arrangements for the past 2 months: Single Family Home           DME Arranged: N/A DME Agency: NA  Social Determinants of Health (SDOH) Interventions SDOH Screenings   Food Insecurity: No Food Insecurity (05/16/2024)  Housing: Low Risk  (05/16/2024)  Transportation Needs: No Transportation Needs (05/16/2024)   Utilities: Not At Risk (05/16/2024)  Tobacco Use: Medium Risk (05/08/2024)   Readmission Risk Interventions    05/16/2024    2:42 PM  Readmission Risk Prevention Plan  Transportation Screening Complete  PCP or Specialist Appt within 5-7 Days Complete  Home Care Screening Complete  Medication Review (RN CM) Complete

## 2024-05-21 NOTE — Inpatient Diabetes Management (Signed)
 Inpatient Diabetes Program Recommendations  AACE/ADA: New Consensus Statement on Inpatient Glycemic Control (2015)  Target Ranges:  Prepandial:   less than 140 mg/dL      Peak postprandial:   less than 180 mg/dL (1-2 hours)      Critically ill patients:  140 - 180 mg/dL   Lab Results  Component Value Date   GLUCAP 145 (H) 05/21/2024   HGBA1C 7.5 (H) 04/16/2024    Review of Glycemic Control  Diabetes history: DM2 Outpatient Diabetes medications: OmniPod or NPH 0-25 units BID prn for hyperglycemia, Novolin R 0-20 units TID before meals Current orders for Inpatient glycemic control: Semglee  25 BID, Novolog  0-20 TID with meals and 0-5 HS + 5 units TID  HgbA1C - 7.5% Hypoglycemia of 65 at [place]  Inpatient Diabetes Program Recommendations:    Decrease Semglee  to 24 units BID  Will complete Diabetes Discharge Order Set  Would not restart pump when discharged. Follow-up with PCP/Endo.  Thank you. Joni Net, RD, LDN, CDCES Inpatient Diabetes Coordinator [phone]

## 2024-05-21 NOTE — Progress Notes (Signed)
 Occupational Therapy Treatment Patient Details Name: Willie [name] MRN: [id number] DOB: 10/17/1961 Today's Date: 05/21/2024   History of present illness Patient 63 year old male presenting to [facility] with AMS on 05/07/2024. S/p ACDF 05/01/2024 developing hallucinations and confusion. Work up reveals Possible Lewy body dementia and Acute delirium with agitation.   PMH includes but is not limited to:  cognitive difficulties, possible MCI at baseline, anxiety, depression, diabetes, diabetic nephropathy and retinopathy, fibromyalgia, hypertension, hyperlipidemia, sleep apnea, documented history of essential tremor   OT comments  Patient was better able to engage in therapeutic activities today compared to OT evaluation. Patient will need 24/7 caregiver support in next level of care.       If plan is discharge home, recommend the following:  Direct supervision/assist for financial management;Supervision due to cognitive status;Direct supervision/assist for medications management;A lot of help with bathing/dressing/bathroom;Assistance with cooking/housework;Assist for transportation   Equipment Recommendations  None recommended by OT       Precautions / Restrictions Precautions Precautions: Fall Recall of Precautions/Restrictions: Impaired Precaution/Restrictions Comments: C spine precautions. Other Brace: soft collar brace no longer needed after fusion Restrictions Weight Bearing Restrictions Per Provider Order: No       Mobility Bed Mobility               General bed mobility comments: patient was up in recliner and returned to the same.             ADL either performed or assessed with clinical judgement   ADL Overall ADL's : Needs assistance/impaired                       Lower Body Dressing Details (indicate cue type and reason): patient was able to figure four legs sitting in recliner while adhering to C spine precautions.                       Cognition Arousal: Alert Behavior During Therapy: Flat affect Cognition: Cognition impaired             OT - Cognition Comments: patient scored 15 on short blessed test indicating impairment consistent with dementia (evaluate for dementing disorder). paitent unable to process counting backwards with two "start overs". patient unable to name months of year.                 Following commands: Impaired Following commands impaired: Follows one step commands with increased time                    Pertinent Vitals/ Pain       Pain Assessment Pain Assessment: No/denies pain         Frequency  Min 2X/week        Progress Toward Goals  OT Goals(current goals can now be found in the care plan section)        Plan         AM-PAC OT "6 Clicks" Daily Activity     Outcome Measure   Help from another person eating meals?: A Little Help from another person taking care of personal grooming?: A Little Help from another person toileting, which includes using toliet, bedpan, or urinal?: A Little Help from another person bathing (including washing, rinsing, drying)?: A Lot Help from another person to put on and taking off regular upper body clothing?: A Little Help from another person to put on and taking off regular lower body clothing?: A Lot 6  Click Score: 16    End of Session Equipment Utilized During Treatment: Gait belt;Rolling walker (2 wheels)  OT Visit Diagnosis: Unsteadiness on feet (R26.81);Pain   Activity Tolerance Patient tolerated treatment well   Patient Left in bed;with call bell/phone within reach;with nursing/sitter in room;with bed alarm set   Nurse Communication Mobility status        Time: 1610-9604 OT Time Calculation (min): 15 min  Charges: OT General Charges $OT Visit: 1 Visit OT Treatments $Therapeutic Activity: 8-22 mins  Wynette Heckler, MS Acute Rehabilitation Department Office# [phone]   Jame Maze 05/21/2024, 1:29 PM

## 2024-05-21 NOTE — Progress Notes (Addendum)
 PROGRESS NOTE    Willie [name]  ZOX:[id number] DOB: Nov 20, 1961 DOA: 05/07/2024 PCP: Rosslyn Coons, MD    Brief Narrative:  62 year old with history of cognitive impairment, DM1, depression, chronic pain, recent C-spine fusion ACDF C5-6 on 5/7 admitted for confusion.  Initial scan showed some epidural hematoma.  Neurosurgery and neurology was consulted.  Workup overall was unrevealing and recommended supportive care and treatment.  On 5/20 due to worsening blood pressure patient was transferred to ICU for better control on his blood pressure and Cleviprex was started.  Also was placed on insulin  drip at that time.  Eventually patient was weaned off the drip.    Insulin  drip was weaned off.  PT/OT initially recommended CIR but now they are recommending home health services. Due to frequent agitation he has been started on bedtime olanzapine  and Risperdal which has been working well for him.  He initially progressed with speech and swallow services and his D3 diet was transition to regular.   Assessment & Plan:  Principal Problem:   Acute metabolic encephalopathy Active Problems:   Worsening short-term memory loss   Epidural hematoma (HCC)   Cervical spinal stenosis s/p fusion 5/7   Hyperlipidemia   Generalized anxiety disorder   Chronic depression   Chronic pain syndrome   History of gait instability   Hypothyroidism   Mild cognitive impairment   Insulin  dependent type 2 diabetes mellitus (HCC)   History of choking   Altered mental status, unspecified   DKA (diabetic ketoacidosis) (HCC)    Acute delirium with agitation, and proved - Intermittent.  Getting bedtime Zyprexa , add Risperdal and additional as needed Zyprexa .     Hypertensive urgency, improved Tachycardia, improved -Improved and stable.  Currently on clonidine  patch 0.3 mg weekly, Coreg  twice daily.  IV as needed     Altered mental status apparently precipitated by recent ACDF Gait instability and h/o falling  backwards Possible Lewy body dementia -Appreciate neurology and neurosurgery evaluation  - Per neurology, Dr. Bonnita Buttner, given history of gait instability/disorder remotely, patient may well have Lewy body type dementia - EEG, B12, TSH and RPR are negative and nonrevealing -Slowly improving, alert and oriented to himself   Intermittent desaturation possibly secondary to apneic episodes Increase secretions per RN Groundglass and LUL on CT Reactive airway disease -Continue DuoNebs as needed, currently no acute wheezing -CT cervical spine showed small amount of patchy groundglass disease in LUL.   DKA with history of DM1 -DKA now has resolved - Blood glucose doses still quite labile.  Followed by diabetic coordinator.   Possible epidural abscess Recent ACDF -Patient has been evaluated by Dr. Rochelle Chu who notes that a small epidural abscess is not uncommon after recent ACDF.  He did not feel MRI or further workup is warranted at this time   Dysphagia - Initially was on dysphagia 3 diet but continued to work well with speech and swallow services.  Now he has been transitioned to regular diet.   Hypothyroidism Resume home meds   Anxiety and depression Would restart meds when patient able to take p.o.      Mild protein calorie malnutrition Nutrition Problem: Inadequate oral intake Etiology: lethargy/confusion Signs/Symptoms: NPO status, energy intake < or equal to 50% for > or equal to 5 days Interventions: Refer to RD note for recommendations     Obesity class I Estimated body mass index is 31.6 kg/m as calculated from the following:   Height as of this encounter: 5\' 9"  (1.753 m).   Weight  as of this encounter: 97.1 kg.  PT/OT-working on placement for safe disposition  DVT prophylaxis: Lovenox     Code Status: Full Code Family Communication: Spoke to his wife yesterday.  Call today, went to voicemail Status is: Inpatient Hopefully we can transition him home with home health  services once his blood glucose stabilizes.  Subjective: Slight confusion overnight but easily redirectable Became hypoglycemic overnight with blood glucose of 65.  Improved this morning but now is getting hypoglycemic again. Examination:  General exam: Appears calm and comfortable  Respiratory system: Clear to auscultation. Respiratory effort normal. Cardiovascular system: S1 & S2 heard, RRR. No JVD, murmurs, rubs, gallops or clicks. No pedal edema. Gastrointestinal system: Abdomen is nondistended, soft and nontender. No organomegaly or masses felt. Normal bowel sounds heard. Central nervous system: Alert and oriented. No focal neurological deficits. Extremities: Symmetric 5 x 5 power. Skin: No rashes, lesions or ulcers Psychiatry: Judgement and insight appear poor                Diet Orders (From admission, onward)     Start     Ordered   05/19/24 1255  Diet Carb Modified Fluid consistency: Thin; Room service appropriate? Yes  Diet effective now       Question Answer Comment  Diet-HS Snack? Nothing   Calorie Level Medium 1600-2000   Fluid consistency: Thin   Room service appropriate? Yes      05/19/24 1254            Objective: Vitals:   05/20/24 1315 05/20/24 2104 05/21/24 0620 05/21/24 1159  BP: 110/75 116/67 (!) 143/73 128/83  Pulse: (!) 107 95 95 98  Resp: 18 18 18 17   Temp: 98.9 F (37.2 C) 98.6 F (37 C) 98.3 F (36.8 C) 98.3 F (36.8 C)  TempSrc: Oral Oral Oral Oral  SpO2: 97% 96% 95% 97%  Weight:      Height:        Intake/Output Summary (Last 24 hours) at 05/21/2024 1210 Last data filed at 05/21/2024 0853 Gross per 24 hour  Intake 460 ml  Output 2050 ml  Net -1590 ml   Filed Weights   05/07/24 1602 05/16/24 0402  Weight: 97.1 kg 93.9 kg    Scheduled Meds:  buPROPion   150 mg Oral Q12H   busPIRone   10 mg Oral Q12H   carvedilol   12.5 mg Oral BID WC   Chlorhexidine  Gluconate Cloth  6 each Topical Q2200   cloNIDine   0.3 mg  Transdermal Weekly   dextrose   1 ampule Intravenous Once   enoxaparin (LOVENOX) injection  40 mg Subcutaneous Q24H   feeding supplement  237 mL Oral TID BM   gabapentin   300 mg Oral Q12H   insulin  aspart  0-20 Units Subcutaneous TID AC & HS   insulin  aspart  5 Units Subcutaneous TID WC   insulin  glargine-yfgn  20 Units Subcutaneous Q12H   levothyroxine   112 mcg Oral Q12H   melatonin  3 mg Oral q1800   multivitamin with minerals  1 tablet Oral Daily   OLANZapine  zydis  5 mg Oral q1800   risperiDONE  1 mg Oral QHS   sodium chloride  flush  3 mL Intravenous Q12H   sodium chloride  flush  3 mL Intravenous Q12H   thiamine  100 mg Oral Daily   Continuous Infusions:  Nutritional status Signs/Symptoms: NPO status, energy intake < or equal to 50% for > or equal to 5 days Interventions: Refer to RD note for recommendations Body  mass index is 30.57 kg/m.  Data Reviewed:   CBC: Recent Labs  Lab 05/16/24 1217 05/18/24 0623 05/19/24 0528 05/20/24 0446 05/21/24 0418  WBC 10.7* 7.8 7.0 7.8 8.9  HGB 13.8 12.9* 14.0 14.5 13.2  HCT 41.0 38.2* 42.5 44.8 40.6  MCV 88.6 89.0 91.0 91.8 91.0  PLT 439* 464* 462* 472* 479*   Basic Metabolic Panel: Recent Labs  Lab 05/16/24 0306 05/18/24 0623 05/19/24 0528 05/20/24 0446 05/21/24 0418  NA 135 136 136 135 132*  K 3.6 3.3* 4.2 4.0 3.7  CL 102 101 100 100 98  CO2 22 27 24 28 28   GLUCOSE 233* 151* 213* 134* 104*  BUN 15 15 14 13 15   CREATININE 0.56* 0.74 0.67 0.74 0.81  CALCIUM  8.2* 8.6* 8.6* 8.8* 8.4*  MG 2.2 2.2 2.2 2.2 2.3  PHOS 3.2 4.8*  --   --   --    GFR: Estimated Creatinine Clearance: 108.4 mL/min (by C-G formula based on SCr of 0.81 mg/dL). Liver Function Tests: No results for input(s): "AST", "ALT", "ALKPHOS", "BILITOT", "PROT", "ALBUMIN " in the last 168 hours. No results for input(s): "LIPASE", "AMYLASE" in the last 168 hours. Recent Labs  Lab 05/15/24 0424  AMMONIA 22   Coagulation Profile: No results for input(s):  "INR", "PROTIME" in the last 168 hours. Cardiac Enzymes: Recent Labs  Lab 05/14/24 1809  CKTOTAL 103   BNP (last 3 results) No results for input(s): "PROBNP" in the last 8760 hours. HbA1C: No results for input(s): "HGBA1C" in the last 72 hours. CBG: Recent Labs  Lab 05/20/24 2057 05/21/24 0000 05/21/24 0033 05/21/24 0726 05/21/24 1200  GLUCAP 171* 65* 185* 145* 325*   Lipid Profile: No results for input(s): "CHOL", "HDL", "LDLCALC", "TRIG", "CHOLHDL", "LDLDIRECT" in the last 72 hours. Thyroid Function Tests: No results for input(s): "TSH", "T4TOTAL", "FREET4", "T3FREE", "THYROIDAB" in the last 72 hours. Anemia Panel: No results for input(s): "VITAMINB12", "FOLATE", "FERRITIN", "TIBC", "IRON", "RETICCTPCT" in the last 72 hours. Sepsis Labs: Recent Labs  Lab 05/15/24 0424  PROCALCITON 0.12    Recent Results (from the past 240 hours)  Culture, blood (Routine X 2) w Reflex to ID Panel     Status: None   Collection Time: 05/14/24  5:03 PM   Specimen: BLOOD LEFT HAND  Result Value Ref Range Status   Specimen Description   Final    BLOOD LEFT HAND Performed at [facility], [address] [place], [place] [address]    Special Requests   Final    BOTTLES DRAWN AEROBIC AND ANAEROBIC Blood Culture adequate volume Performed at [facility], [address] [place], [place] [address]    Culture   Final    NO GROWTH 5 DAYS Performed at [facility], [address], [place], [place] [address]    Report Status 05/19/2024 FINAL  Final  Culture, blood (Routine X 2) w Reflex to ID Panel     Status: None (Preliminary result)   Collection Time: 05/14/24  6:09 PM   Specimen: BLOOD RIGHT HAND  Result Value Ref Range Status   Specimen Description   Final    BLOOD RIGHT HAND Performed at [facility], [address] [place], [place] [address]    Special Requests   Final    BOTTLES DRAWN AEROBIC AND ANAEROBIC Blood Culture adequate  volume Performed at [facility], [address], [place], [place] [address]    Culture  Setup Time  Final    GRAM POSITIVE RODS AEROBIC BOTTLE ONLY CRITICAL RESULT CALLED TO, READ BACK BY AND VERIFIED WITH: PHARMD E. JACKSON [id number] @ 2322 FH    Culture   Final    GRAM POSITIVE RODS SENT TO [facility] FOR REFERENCE ID Performed at [facility], [address] [place], [place] [address]    Report Status PENDING  Incomplete         Radiology Studies: No results found.         LOS: 13 days   Time spent= 35 mins    Maggie Schooner, MD [facility]  If 7PM-7AM, please contact night-coverage  05/21/2024, 12:10 PM

## 2024-05-22 ENCOUNTER — Ambulatory Visit: Payer: Medicare Other | Admitting: Neurology

## 2024-05-22 ENCOUNTER — Encounter: Payer: Self-pay | Admitting: Neurology

## 2024-05-22 ENCOUNTER — Inpatient Hospital Stay (HOSPITAL_COMMUNITY)

## 2024-05-22 ENCOUNTER — Other Ambulatory Visit (HOSPITAL_COMMUNITY): Payer: Self-pay

## 2024-05-22 VITALS — BP 134/77 | HR 97

## 2024-05-22 DIAGNOSIS — R29818 Other symptoms and signs involving the nervous system: Secondary | ICD-10-CM

## 2024-05-22 DIAGNOSIS — R410 Disorientation, unspecified: Secondary | ICD-10-CM

## 2024-05-22 DIAGNOSIS — M4802 Spinal stenosis, cervical region: Secondary | ICD-10-CM | POA: Diagnosis not present

## 2024-05-22 DIAGNOSIS — R4189 Other symptoms and signs involving cognitive functions and awareness: Secondary | ICD-10-CM

## 2024-05-22 DIAGNOSIS — F0283 Dementia in other diseases classified elsewhere, unspecified severity, with mood disturbance: Secondary | ICD-10-CM

## 2024-05-22 DIAGNOSIS — E1310 Other specified diabetes mellitus with ketoacidosis without coma: Secondary | ICD-10-CM | POA: Diagnosis not present

## 2024-05-22 DIAGNOSIS — G9341 Metabolic encephalopathy: Secondary | ICD-10-CM | POA: Diagnosis not present

## 2024-05-22 DIAGNOSIS — E038 Other specified hypothyroidism: Secondary | ICD-10-CM

## 2024-05-22 DIAGNOSIS — G3183 Dementia with Lewy bodies: Secondary | ICD-10-CM

## 2024-05-22 DIAGNOSIS — S064XAA Epidural hemorrhage with loss of consciousness status unknown, initial encounter: Secondary | ICD-10-CM | POA: Diagnosis not present

## 2024-05-22 LAB — BASIC METABOLIC PANEL WITH GFR
Anion gap: 10 (ref 5–15)
BUN: 15 mg/dL (ref 8–23)
CO2: 28 mmol/L (ref 22–32)
Calcium: 9 mg/dL (ref 8.9–10.3)
Chloride: 99 mmol/L (ref 98–111)
Creatinine, Ser: 0.73 mg/dL (ref 0.61–1.24)
GFR, Estimated: >60 mL/min (ref >60)
Glucose, Bld: 131 mg/dL — ABNORMAL HIGH (ref 70–99)
Potassium: 3.9 mmol/L (ref 3.5–5.1)
Sodium: 137 mmol/L (ref 135–145)

## 2024-05-22 LAB — CBC
HCT: 43.9 % (ref 39.0–52.0)
Hemoglobin: 14 g/dL (ref 13.0–17.0)
MCH: 28.9 pg (ref 26.0–34.0)
MCHC: 31.9 g/dL (ref 30.0–36.0)
MCV: 90.7 fL (ref 80.0–100.0)
Platelets: 494 10*3/uL — ABNORMAL HIGH (ref 150–400)
RBC: 4.84 MIL/uL (ref 4.22–5.81)
RDW: 12.5 % (ref 11.5–15.5)
WBC: 7.6 10*3/uL (ref 4.0–10.5)
nRBC: 0 % (ref 0.0–0.2)

## 2024-05-22 LAB — GLUCOSE, CAPILLARY
Glucose-Capillary: 71 mg/dL (ref 70–99)
Glucose-Capillary: 255 mg/dL — ABNORMAL HIGH (ref 70–99)
Glucose-Capillary: 385 mg/dL — ABNORMAL HIGH (ref 70–99)

## 2024-05-22 LAB — MAGNESIUM: Magnesium: 2.3 mg/dL (ref 1.7–2.4)

## 2024-05-22 MED ORDER — CARVEDILOL 12.5 MG PO TABS
12.5000 mg | ORAL_TABLET | Freq: Two times a day (BID) | ORAL | 0 refills | Status: AC
  Filled 2024-05-22: qty 90, 45d supply, fill #0

## 2024-05-22 MED ORDER — INSULIN GLARGINE-YFGN 100 UNIT/ML ~~LOC~~ SOLN
20.0000 [IU] | Freq: Two times a day (BID) | SUBCUTANEOUS | 11 refills | Status: AC
  Filled 2024-05-22: qty 10, 25d supply, fill #0

## 2024-05-22 MED ORDER — INSULIN ASPART 100 UNIT/ML IJ SOLN
7.0000 [IU] | Freq: Three times a day (TID) | INTRAMUSCULAR | 11 refills | Status: AC
  Filled 2024-05-22: qty 10, 48d supply, fill #0

## 2024-05-22 MED ORDER — CLONIDINE 0.3 MG/24HR TD PTWK
0.3000 mg | MEDICATED_PATCH | TRANSDERMAL | 0 refills | Status: AC
  Filled 2024-05-22: qty 4, 28d supply, fill #0

## 2024-05-22 MED ORDER — OLANZAPINE 5 MG PO TBDP
5.0000 mg | ORAL_TABLET | Freq: Two times a day (BID) | ORAL | 0 refills | Status: DC
  Filled 2024-05-22: qty 30, 15d supply, fill #0

## 2024-05-22 NOTE — Care Management Important Message (Signed)
 Important Message  Patient Details  Name: Willie [name] MRN: [id number] Date of Birth: 08/22/1961   Important Message Given:  Yes - Medicare IM (copy mailed to address on file)     Neila Bally 05/22/2024, 11:52 AM

## 2024-05-22 NOTE — Discharge Instructions (Signed)
 Please take all prescribed medications exactly as instructed including your new medications such as Zydis for Lewy body dementia and adjustment to your insulin  regimen. Please consume a low carbohydrate, low sodium diet Please avoid using your insulin  pump for now until you follow-up with your endocrinologist.  Use your the insulin  regimen exactly as prescribed. Please increase your physical activity as tolerated. Please maintain all outpatient follow-up appointments including follow-up with your primary care provider, neurologist and endocrinologist. Please return to the emergency department if you develop worsening confusion weakness or inability to tolerate oral intake.

## 2024-05-22 NOTE — Discharge Summary (Addendum)
 Physician Discharge Summary   Patient: Willie [name] MRN: [id number] DOB: 1961/05/27  Admit date:     05/07/2024  Discharge date: 05/22/24  Discharge Physician: True Fuss   PCP: Rosslyn Coons, MD   Recommendations at discharge:   Please take all prescribed medications exactly as instructed including your new medications such as Zydis for Lewy body dementia and adjustment to your insulin  regimen. Please consume a low carbohydrate, low sodium diet Please avoid using your insulin  pump for now until you follow-up with your endocrinologist.  Use your the insulin  regimen exactly as prescribed. Please increase your physical activity as tolerated. Please maintain all outpatient follow-up appointments including follow-up with your primary care provider, neurologist and endocrinologist. Please return to the emergency department if you develop worsening confusion weakness or inability to tolerate oral intake.  Discharge Diagnoses: Principal Problem: Altered Mental Status due to Lewy Body Dementia Active Problems:   Worsening short-term memory loss   Epidural hematoma (HCC)   Cervical spinal stenosis s/p fusion 5/7   Hyperlipidemia   Generalized anxiety disorder   Chronic depression   Chronic pain syndrome   History of gait instability   Hypothyroidism   Mild cognitive impairment   Insulin  dependent type 2 diabetes mellitus (HCC)   History of choking   Altered mental status, unspecified   DKA (diabetic ketoacidosis) (HCC)  Resolved Problems:   * No resolved hospital problems. [facility] Course:  63 year old with history of cognitive impairment, DM1, depression, chronic pain, recent C-spine fusion ACDF C5-6 on 5/7 admitted for confusion.  The hospitalist group was called to assess the patient for mission to the hospital.    Initial scan showed some epidural hematoma.  Neurosurgery and neurology was consulted.  Workup overall was unrevealing and recommended supportive care  and treatment.    On 5/20 due to worsening blood pressure patient was transferred to ICU for better control on his blood pressure and Cleviprex  was started.  Patient was additionally suffering from DKA and also was placed on insulin  drip at that time.  Eventually patient was weaned off the drip and patient was transferred out of the ICU back to the medical floor.    Insulin  drip was additionally weaned off.    On the medical floor, transition to subcutaneous insulin  was complicated by labile blood sugars.  Diabetic coordinator assisted in managing basal bolus insulin  therapy.  Arrangements were eventually made for patient to receive Semglee  20 units twice daily alongside NovoLog  7 units before every meal.  Patient was instructed to not use his insulin  pump for the time being and follow-up closely with his endocrinologist at time of discharge.  Due to patient's persistent weakness, PT/OT initially recommended CIR but due to improving strength they subsequently recommended health services.  Hospital course was complicated by frequent agitation.  A thorough workup was undertaken including EEG, B12, TSH and RPR all of which were unremarkable.  Case was discussed with Dr. Bonnita Buttner with neurology and it was felt that the patient was possibly suffering from Lewy body dementia.  Patient was initiated on atypical antipsychotic agents which were titrated with substantial improvement in symptoms.  Patient was briefly placed on a combination of olanzapine  and risperidone  but was later transition to olanzapine  5 mg twice daily prior to discharge.  Patient clinically improved and was discharged home with home health services in improved and stable condition on 05/22/2024.  Patient and wife were instructed to ensure patient has close follow-up as an outpatient with his  PCP, endocrinology and neurology.     Pain control - [place]  Controlled Substance Reporting System database was reviewed. and patient was  instructed, not to drive, operate heavy machinery, perform activities at heights, swimming or participation in water  activities or provide baby-sitting services while on Pain, Sleep and Anxiety Medications; until their outpatient Physician has advised to do so again. Also recommended to not to take more than prescribed Pain, Sleep and Anxiety Medications.   Consultants: Dr. Bonnita Buttner with Neurology, Dr. Felipe Horton with PCCM, Dr. Rochelle Chu with Neurosurgery Procedures performed: None  Disposition: Home health Diet recommendation:  Discharge Diet Orders (From admission, onward)     Start     Ordered   05/22/24 0000  Diet Carb Modified        05/22/24 1038           Cardiac and Carb modified diet  DISCHARGE MEDICATION: Allergies as of 05/22/2024       Reactions   Other Other (See Comments), Shortness Of Breath, Swelling   Flu vaccine   Latex Rash   Adhesive         Medication List     STOP taking these medications    albuterol  108 (90 Base) MCG/ACT inhaler Commonly known as: VENTOLIN  HFA   amitriptyline  50 MG tablet Commonly known as: ELAVIL    atenolol  50 MG tablet Commonly known as: TENORMIN    insulin  regular 100 units/mL injection Commonly known as: NOVOLIN R   Omnipod 5 DexG7G6 Pods Gen 5 Misc       TAKE these medications    buPROPion  150 MG 12 hr tablet Commonly known as: WELLBUTRIN  SR Take 150 mg by mouth 2 (two) times daily.   busPIRone  10 MG tablet Commonly known as: BUSPAR  Take 10 mg by mouth 2 (two) times daily.   carvedilol  12.5 MG tablet Commonly known as: COREG  Take 1 tablet (12.5 mg total) by mouth 2 (two) times daily with a meal.   cloNIDine  0.3 mg/24hr patch Commonly known as: CATAPRES  - Dosed in mg/24 hr Place 1 patch (0.3 mg total) onto the skin once a week. Start taking on: May 27, 2024   ezetimibe  10 MG tablet Commonly known as: ZETIA  Take 10 mg by mouth daily.   furosemide 20 MG tablet Commonly known as: LASIX Take 20 mg by mouth 2  (two) times daily.   gabapentin  300 MG capsule Commonly known as: NEURONTIN  Take 300 mg by mouth 2 (two) times daily.   HYDROcodone -acetaminophen  10-325 MG tablet Commonly known as: NORCO Take 1 tablet by mouth every 4 (four) hours as needed for moderate pain (pain score 4-6).   insulin  aspart 100 UNIT/ML injection Commonly known as: novoLOG  Inject 7 Units into the skin 3 (three) times daily with meals. What changed:  how much to take when to take this additional instructions   insulin  glargine-yfgn 100 UNIT/ML injection Commonly known as: SEMGLEE  Inject 0.2 mLs (20 Units total) into the skin every 12 (twelve) hours.   levothyroxine  112 MCG tablet Commonly known as: SYNTHROID  Take 112 mcg by mouth 2 (two) times daily.   OLANZapine  zydis 5 MG disintegrating tablet Commonly known as: ZYPREXA  Take 1 tablet (5 mg total) by mouth in the morning and at bedtime.   omeprazole  40 MG capsule Commonly known as: PRILOSEC TAKE ONE CAPSULE BY MOUTH EVERY MORNING AND EVERY EVENING   rosuvastatin  10 MG tablet Commonly known as: CRESTOR  Take 10 mg by mouth 3 (three) times a week.   tiZANidine  4 MG tablet Commonly  known as: ZANAFLEX  Take 1 tablet (4 mg total) by mouth every 8 (eight) hours as needed for muscle spasms.   traMADol 50 MG tablet Commonly known as: ULTRAM Take 50 mg by mouth 3 (three) times daily.   vitamin C 1000 MG tablet Take 1,000 mg by mouth daily.        Follow-up Information     [facility] Follow up.   Specialty: Home Health Services Why: [facility] will resume PT and OT in the home. Contact information: [address] [place] [address] [phone]         Rosslyn Coons, MD. Schedule an appointment as soon as possible for a visit.   Specialty: Endocrinology Contact information: [address] [place] [address] [phone]                 Discharge Exam: Cleavon Curls Weights   05/07/24 1602 05/16/24 0402   Weight: 97.1 kg 93.9 kg    Constitutional: Awake alert and oriented x3, no associated distress.   Respiratory: clear to auscultation bilaterally, no wheezing, no crackles. Normal respiratory effort. No accessory muscle use.  Cardiovascular: Regular rate and rhythm, no murmurs / rubs / gallops. No extremity edema. 2+ pedal pulses. No carotid bruits.  Abdomen: Abdomen is soft and nontender.  No evidence of intra-abdominal masses.  Positive bowel sounds noted in all quadrants.   Musculoskeletal: No joint deformity upper and lower extremities. Good ROM, no contractures. Normal muscle tone.     Condition at discharge: fair  The results of significant diagnostics from this hospitalization (including imaging, microbiology, ancillary and laboratory) are listed below for reference.   Imaging Studies: DG Chest 1 View Result Date: 05/22/2024 CLINICAL DATA:  Evaluate for possible tuberculosis EXAM: PORTABLE CHEST 1 VIEW COMPARISON:  05/14/2024 FINDINGS: Cardiac shadow is stable. Lungs are clear bilaterally. No focal infiltrate or effusion is noted. No bony abnormality is seen. Postsurgical changes in the cervical spine are noted. IMPRESSION: No active disease. Electronically Signed   By: Violeta Grey M.D.   On: 05/22/2024 10:37   DG Swallowing Func-Speech Pathology Result Date: 05/16/2024 Table formatting from the original result was not included. Modified Barium Swallow Study Patient Details Name: TYMERE DEPUY MRN: [id number] Date of Birth: 1961/09/17 Today's Date: 05/16/2024 HPI/PMH: HPI: Patient is a 63 y.o. male with PMH: cognitive impairment (undiagnosed but spouse suspects Lewy body dementia), depression, GAD, chronic pain syndrome, h/o gait instability, reactive airway disease, IDDM-2, hypothyroidism, HLD, cervical stenosis. He underwent ACDF surgery 05/01/24. He presented to the hospital on 05/08/24 with increasing confusion and hallucinations  which seems to get worse at night. His spouse reported that  he has had worsening choking of food that occured even prior to ACDF surgery. He has been NPO and swallow evaluation has not been able to be completed due to patient with significant agitation, confusion, requiring 4-point restraints. CT cervical spine showed small amount of patchy, ground glass disease of left upper lobe apex, could be due to PNA, edema or scarring.  Initially was placed on full liquid nectar thick diet but this was changed to n.p.o.  NG tube placement attempted yesterday in IR but was unable to be transited into esophagus. Clinical Impression: Clinical Impression: Patient with minimal dysphagia with primarily decreased tongue base retraction/epiglottic deflection.  Slight delay in oral transiting most notably with liquids with patient benefiting from verbal cues at times to swallow at times.  Pharyngeal swallow notable for mild vallecular more than piriform sinus retention (  due to decreased tongue base retraction with impaired epiglottic deflection) without patient awareness.  Cued expectoration not effective to clear vallecular retention however further intake did not significantly add to mild retention.       Patient notably with premature spillage of pure mixed with secretions to the piriform sinus prior to swallow initiation.  And with initial bolus of pure.  He demonstrated gagging response propelling bolus that was aggregating at vallecular space into oral cavity.  He subsequently was able to swallow reflexively to clear.  Second pured bolus provided with cue for patient to conduct effortful swallow.  Patient demonstrated ability to gather bolus in oral cavity fully to rapidly propel entire bolus through pharynx.  This strategy also resulted in less retention.  Adequate rotary mastication noted with solid patient with functional pharyngeal clearance.       Patient was challenged with sequential boluses of thin without airway compromise but he does tend to feel his mouth full and allow  premature spillage prior to swallow trigger.       Barium tablet provided with nectar thick liquid easily transited through oropharynx into esophagus.  Upon esophageal sweep patient appeared clear, MBS does not assess below the PES.  At this time recommend Dysphagia 3/thin diet due to patient's cognition, oral transiting discoordination with liquids and vallecular more than piriform sinus retention (without awareness to retention).   Note pt has had prior esophageal dilatation per chart review.  SLP will follow briefly for dysphagia management, education with family/pt. Factors that may increase risk of adverse event in presence of aspiration Roderick Civatte & Jessy Morocco 2021): Factors that may increase risk of adverse event in presence of aspiration Roderick Civatte & Jessy Morocco 2021): Reduced cognitive function; Limited mobility Recommendations/Plan: Swallowing Evaluation Recommendations Swallowing Evaluation Recommendations Recommendations: PO diet PO Diet Recommendation: Dysphagia 3 (Mechanical soft); Thin liquids (Level 0) Liquid Administration via: Cup; Straw Medication Administration: Other (Comment) (Whole with Ensure, applesauce or ice cream as tolerated.) Supervision: Full supervision/cueing for swallowing strategies (Full assist initially, especially in light of premorbid dysphagia per prior SLP note) Swallowing strategies  : Slow rate; Minimize environmental distractions; Small bites/sips Postural changes: Stay upright 30-60 min after meals; Position pt fully upright for meals Oral care recommendations: Oral care BID (2x/day) Treatment Plan Treatment Plan Treatment recommendations: Therapy as outlined in treatment plan below Follow-up recommendations: No SLP follow up Functional status assessment: Patient has had a recent decline in their functional status and demonstrates the ability to make significant improvements in function in a reasonable and predictable amount of time. Treatment frequency: Min 1x/week Treatment  duration: 1 week Interventions: Aspiration precaution training; Compensatory techniques; Patient/family education Recommendations Recommendations for follow up therapy are one component of a multi-disciplinary discharge planning process, led by the attending physician.  Recommendations may be updated based on patient status, additional functional criteria and insurance authorization. Assessment: Orofacial Exam: Orofacial Exam Oral Cavity: Oral Hygiene: WFL Oral Cavity - Dentition: Adequate natural dentition Orofacial Anatomy: WFL Oral Motor/Sensory Function: WFL Anatomy: Anatomy: Presence of cervical hardware Boluses Administered: Boluses Administered Boluses Administered: Thin liquids (Level 0); Mildly thick liquids (Level 2, nectar thick); Moderately thick liquids (Level 3, honey thick); Puree; Solid  Oral Impairment Domain: Oral Impairment Domain Lip Closure: No labial escape Tongue control during bolus hold: Cohesive bolus between tongue to palatal seal Bolus preparation/mastication: Timely and efficient chewing and mashing Bolus transport/lingual motion: Delayed initiation of tongue motion (oral holding) Oral residue: Trace residue lining oral structures Location of oral residue : Tongue Initiation  of pharyngeal swallow : Valleculae; Pyriform sinuses  Pharyngeal Impairment Domain: Pharyngeal Impairment Domain Soft palate elevation: No bolus between soft palate (SP)/pharyngeal wall (PW) Laryngeal elevation: Partial superior movement of thyroid cartilage/partial approximation of arytenoids to epiglottic petiole Anterior hyoid excursion: Partial anterior movement Epiglottic movement: Partial inversion Laryngeal vestibule closure: Incomplete, narrow column air/contrast in laryngeal vestibule Pharyngeal contraction (A/P view only): N/A Pharyngoesophageal segment opening: Complete distension and complete duration, no obstruction of flow Tongue base retraction: Narrow column of contrast or air between tongue base and  PPW Pharyngeal residue: Trace residue within or on pharyngeal structures Location of pharyngeal residue: Valleculae; Pyriform sinuses  Esophageal Impairment Domain: Esophageal Impairment Domain Esophageal clearance upright position: Complete clearance, esophageal coating Pill: Pill Consistency administered: Mildly thick liquids (Level 2, nectar thick) Mildly thick liquids (Level 2, nectar thick): WFL Penetration/Aspiration Scale Score: Penetration/Aspiration Scale Score 1.  Material does not enter airway: Mildly thick liquids (Level 2, nectar thick); Moderately thick liquids (Level 3, honey thick); Puree; Solid; Pill; Thin liquids (Level 0) 2.  Material enters airway, remains ABOVE vocal cords then ejected out: Thin liquids (Level 0) 3.  Material enters airway, remains ABOVE vocal cords and not ejected out: Thin liquids (Level 0) Compensatory Strategies: Compensatory Strategies Compensatory strategies: Yes Effortful swallow: Effective Other(comment): Effective Effective Other(comment): Thin liquid (Level 0); Mildly thick liquid (Level 2, nectar thick) (cue to swallow)   General Information: Caregiver present: No (RN, Stacy present)  Diet Prior to this Study: NPO   Temperature : Normal   Respiratory Status: WFL   Supplemental O2: None (Room air)   History of Recent Intubation: No  Behavior/Cognition: Alert; Cooperative; Pleasant mood Self-Feeding Abilities: Other (Comment) (Appears to have some weakness with self-feeding over reaching to the left side of his mouth with the right hand) Baseline vocal quality/speech: Normal Volitional Cough: Able to elicit Volitional Swallow: Able to elicit Exam Limitations: No limitations Goal Planning: Prognosis for improved oropharyngeal function: Good Barriers to Reach Goals: Cognitive deficits No data recorded Patient/Family Stated Goal: pt remains confused, pleasant but able to state his name and birthday Consulted and agree with results and recommendations: Pt unable/family or  caregiver not available; Nurse Pain: Pain Assessment Pain Assessment: No/denies pain Breathing: 0 Negative Vocalization: 0 Facial Expression: 0 Body Language: 0 Consolability: 0 PAINAD Score: 0 End of Session: Start Time:No data recorded Stop Time: No data recorded Time Calculation:No data recorded Charges: No data recorded SLP visit diagnosis: SLP Visit Diagnosis: Dysphagia, oropharyngeal phase (R13.12) Past Medical History: Past Medical History: Diagnosis Date  Anxiety   Arthritis   BPH (benign prostatic hyperplasia)   Depression   Diabetes (HCC)   Diabetic neuropathy (HCC)   Diabetic retinopathy (HCC)   Esophageal stricture   Fibromyalgia   History of kidney stones   Hyperlipidemia   Hypertension   Hypothyroidism   Melanoma (HCC)   OSA (obstructive sleep apnea)   Polyneuropathy   Tremor  Past Surgical History: Past Surgical History: Procedure Laterality Date  ANTERIOR CERVICAL DECOMP/DISCECTOMY FUSION N/A 05/01/2024  Procedure: ANTERIOR CERVICAL DECOMPRESSION/DISCECTOMY FUSION  Cervical Five-Cervical Six, remove tether plate Cervical Six-Seven;  Surgeon: Joaquin Mulberry, MD;  Location: [facility] OR;  Service: Neurosurgery;  Laterality: N/A;  ACDF - C3-C4 - C4-C5 - C5-C6, remove tether plate Z6-1  CARPAL TUNNEL RELEASE Bilateral 2002  CATARACT EXTRACTION    CERVICAL DISC SURGERY  2005  CYST EXCISION  2002  middle finger  CYST EXCISION  2007  left thigh  ESOPHAGEAL DILATION    ROTATOR  CUFF REPAIR Left   TRIGGER FINGER RELEASE Bilateral  Chantal Comment 05/16/2024, 11:18 AM   Maudie Sorrow, MS [facility] SLP Acute Rehab Services Office [phone]     DG Fluoro Rm 1-60 Min - No Report Result Date: 05/15/2024 Fluoroscopy was utilized by the requesting physician.  No radiographic interpretation.   DG Chest 1 View Result Date: 05/14/2024 CLINICAL DATA:  Difficulty breathing EXAM: PORTABLE CHEST 1 VIEW COMPARISON:  05/09/2019 FINDINGS: The heart size and mediastinal contours are within normal limits. Both lungs are clear. The  visualized skeletal structures are unremarkable. IMPRESSION: No active disease. Electronically Signed   By: Violeta Grey M.D.   On: 05/14/2024 20:19   EEG adult Result Date: 05/09/2024 Arleene Lack, MD     05/09/2024  5:58 PM Patient Name: TRELON PLUSH MRN: [id number] Epilepsy Attending: Arleene Lack Referring Physician/Provider: Tona Francis, MD Date: 05/09/2024 Duration: 35.05 mins Patient history: 63yo M with ams. EEG to evaluate for seizure Level of alertness: Awake/ lethargic AEDs during EEG study: Ativan  Technical aspects: This EEG study was done with scalp electrodes positioned according to the 10-20 International system of electrode placement. Electrical activity was reviewed with band pass filter of 1-70Hz , sensitivity of 7 uV/mm, display speed of 81mm/sec with a 60Hz  notched filter applied as appropriate. EEG data were recorded continuously and digitally stored.  Video monitoring was available and reviewed as appropriate. Description: EEG showed continuous generalized 3 to 6 Hz theta-delta slowing admixed with 15 to 18 Hz beta activity distributed symmetrically and diffusely.  Hyperventilation and photic stimulation were not performed.   ABNORMALITY - Continuous slow, generalized IMPRESSION: This study is suggestive of moderate diffuse encephalopathy. No seizures or epileptiform discharges were seen throughout the recording. Arleene Lack   MR BRAIN WO CONTRAST Result Date: 05/08/2024 CLINICAL DATA:  63 year old male recently status post spine surgery. Neurologic deficit, short-term memory issues. EXAM: MRI HEAD WITHOUT CONTRAST TECHNIQUE: Multiplanar, multiecho pulse sequences of the brain and surrounding structures were obtained without intravenous contrast. COMPARISON:  Head and cervical spine CT yesterday. FINDINGS: Study is intermittently degraded by motion artifact despite repeated imaging attempts, especially coronal T2 imaging. Brain: No restricted diffusion or evidence of acute  infarction. Nonspecific ventriculomegaly as demonstrated on the CT, with lateral ventricles most affected although fairly diminutive temporal horns. Fourth ventricle size normal. No transependymal edema. No superimposed midline shift, mass effect, evidence of mass lesion, extra-axial collection or acute intracranial hemorrhage. Cervicomedullary junction and pituitary are within normal limits. Centrally normal for age gray and white matter signal throughout the brain. No cortical encephalomalacia or chronic cerebral blood products identified. Deep gray nuclei, brainstem and cerebellum appear negative. Grossly negative mesial temporal lobe structures. Vascular: Major intracranial vascular flow voids are preserved. Skull and upper cervical spine: Negative for age visible cervical spine. Visualized bone marrow signal is within normal limits. Sinuses/Orbits: Postoperative changes to both globes, otherwise negative. Other: Paranasal Visualized paranasal sinuses and mastoids are clear. IMPRESSION: 1. Mildly motion degraded exam.  No acute intracranial abnormality. 2. Nonspecific lateral ventriculomegaly. Otherwise negative for age noncontrast MRI appearance of the Brain. Electronically Signed   By: Marlise Simpers M.D.   On: 05/08/2024 06:49   DG CHEST PORT 1 VIEW Result Date: 05/08/2024 CLINICAL DATA:  63 year old male with recent cervical spine surgery. Ground-glass opacity in the left lung apex on cervical spine CT yesterday. EXAM: PORTABLE CHEST 1 VIEW COMPARISON:  Cervical spine CT 05/07/2024, chest radiographs 12/18/2010. FINDINGS: Portable AP semi upright view at 0425  hours. Cervical ACDF hardware is visible, some was present in 2011. Lung volumes are within normal limits. Improved right perihilar and right lung base ventilation compared to 2011, and when allowing for portable technique the lungs are clear. No pneumothorax or pleural effusion. Mediastinal contours are within normal limits. No acute osseous abnormality  identified. Paucity of bowel gas the visible abdomen. IMPRESSION: Negative portable chest. Electronically Signed   By: Marlise Simpers M.D.   On: 05/08/2024 04:56   CT Cervical Spine Wo Contrast Result Date: 05/08/2024 CLINICAL DATA:  C5-6 ACDF fusion surgery was done 6 days ago. Increasing pain and memory issues. ER also indicates blunt polytrauma. EXAM: CT CERVICAL SPINE WITHOUT CONTRAST TECHNIQUE: Multidetector CT imaging of the cervical spine was performed without intravenous contrast. Multiplanar CT image reconstructions were also generated. RADIATION DOSE REDUCTION: This exam was performed according to the departmental dose-optimization program which includes automated exposure control, adjustment of the mA and/or kV according to patient size and/or use of iterative reconstruction technique. COMPARISON:  Preoperative MRI 03/25/2024. FINDINGS: Alignment: Normal with the exception of bone-on-bone anterior atlantodental joint space loss with prominent osteophytes, and dorsal pannus at C1-2. No traumatic listhesis. Skull base and vertebrae: No fracture or primary pathologic process is seen. The lower half of the pre-existing C6-7 ACDF plate hardware remains in place, the upper half has been removed with interval insertion of well seated C5-6 ACDF plate hardware and securing screws. There is an interbody bone plug or other surgical material. There is solid fusion including the facet joints at C6-7. Bulky anterior bridging enthesopathy C3-5 again noted, anterior nonbridging osteophytes C2-3, anterior bridging enthesopathy again C7-T1 T1-2. Soft tissues and spinal canal: No prevertebral fluid or swelling. No visible canal hematoma. Disc levels: There is preservation of the normal disc heights C2-3 through C4-5, interval ACDF and interbody bone plug or surgical material C5-6 without evidence of hardware loosening, mature chronic fusion of C6-7 and disc space loss C7-T1 and T1-2 chronically. At C2-3 there is a a mild disc  bulge indenting the ventral thecal sac, without mass effect At C3-4, posterior disc bulge and dorsal endplate ridging both contribute to mild-to-moderate central canal stenosis with mild mass effect on the cord flattening its ventral surface. There is uncinate spurring with mild bilateral foraminal narrowing. At C4-5, there is a mild nonstenosing posterior disc osteophyte complex without herniation. Mild uncinate ridging with slight foraminal stenosis. C5-6, plate hardware is again seen from recent surgery. The spinal canal is not optimally seen owing to metal artifact, but there is concern for a left paracentral postoperative disc herniation versus a focal epidural hematoma compressing the left hemicord best seen on 9: 61 and 62 and on sagittal reconstruction image 7:43. Mild uncinate ridging and bilateral mild foraminal stenosis. At C6-7, there is mature fusion. Spinal canal and foramina are patent. At C7-T1 there are degenerative changes of the foraminal or spinal canal mass effect. Upper chest: Small amount patchy ground-glass disease left upper lobe apex, could be due to pneumonia, edema or ground-glass scarring. This was not present on the chest CT dated 07/09/2020. Other: None. IMPRESSION: 1. Interval ACDF at C5-6 with well seated plate hardware and securing screws. 2. The spinal canal is not optimally seen owing to metal artifact at this level, but there is concern for a left paracentral postoperative disc herniation versus a focal epidural hematoma compressing the left hemicord. MRI recommended. 3. No evidence of cervical spine fractures or traumatic listhesis. 4. Multilevel degenerative changes, with mild-to-moderate central canal stenosis  at C3-4 with mild mass effect on the cord, stable since the preoperative MRI. 5. Small amount of patchy ground-glass disease left upper lobe apex, could be due to pneumonia, edema or ground-glass scarring. Follow-up as indicated. Electronically Signed   By: Denman Fischer M.D.   On: 05/08/2024 00:27   CT Head Wo Contrast Result Date: 05/07/2024 CLINICAL DATA:  Mental status change, unknown cause EXAM: CT HEAD WITHOUT CONTRAST TECHNIQUE: Contiguous axial images were obtained from the base of the skull through the vertex without intravenous contrast. RADIATION DOSE REDUCTION: This exam was performed according to the departmental dose-optimization program which includes automated exposure control, adjustment of the mA and/or kV according to patient size and/or use of iterative reconstruction technique. COMPARISON:  None Available. FINDINGS: Brain: There is periventricular white matter decreased attenuation consistent with small vessel ischemic changes. Ventricles, sulci and cisterns are prominent consistent with age related involutional changes. No acute intracranial hemorrhage, mass effect or shift. No hydrocephalus. Vascular: No hyperdense vessel or unexpected calcification. Skull: Normal. Negative for fracture or focal lesion. Sinuses/Orbits: No acute finding. IMPRESSION: Atrophy and chronic small vessel ischemic changes. No acute intracranial process identified. Electronically Signed   By: Sydell Eva M.D.   On: 05/07/2024 18:53   DG Cervical Spine 2 or 3 views Result Date: 05/01/2024 CLINICAL DATA:  Elective surgery. EXAM: CERVICAL SPINE - 2-3 VIEW COMPARISON:  None Available. FINDINGS: Four fluoroscopic spot views of the cervical spine submitted from the operating room. Imaging obtained during anterior fusion C5-C6. Fluoroscopy time 30 seconds. Dose 17.86 mGy. IMPRESSION: Intraoperative fluoroscopy during anterior fusion C5-C6. Electronically Signed   By: Chadwick Colonel M.D.   On: 05/01/2024 12:10   DG C-Arm 1-60 Min-No Report Result Date: 05/01/2024 Fluoroscopy was utilized by the requesting physician.  No radiographic interpretation.   DG C-Arm 1-60 Min-No Report Result Date: 05/01/2024 Fluoroscopy was utilized by the requesting physician.  No  radiographic interpretation.    Microbiology: Results for orders placed or performed during the hospital encounter of 05/07/24  MRSA Next Gen by PCR, Nasal     Status: None   Collection Time: 05/08/24  6:09 PM   Specimen: Nasal Mucosa; Nasal Swab  Result Value Ref Range Status   MRSA by PCR Next Gen NOT DETECTED NOT DETECTED Final    Comment: (NOTE) The GeneXpert MRSA Assay (FDA approved for NASAL specimens only), is one component of a comprehensive MRSA colonization surveillance program. It is not intended to diagnose MRSA infection nor to guide or monitor treatment for MRSA infections. Test performance is not FDA approved in patients less than 14 years old. Performed at [facility], [address], [place], [place] [address]   Culture, blood (Routine X 2) w Reflex to ID Panel     Status: None   Collection Time: 05/14/24  5:03 PM   Specimen: BLOOD LEFT HAND  Result Value Ref Range Status   Specimen Description   Final    BLOOD LEFT HAND Performed at [facility], [address] [place], [place] [address]    Special Requests   Final    BOTTLES DRAWN AEROBIC AND ANAEROBIC Blood Culture adequate volume Performed at [facility], [address], [place], [place] [address]    Culture   Final    NO GROWTH 5 DAYS Performed at [facility], [address] [place], [place] [address]    Report Status 05/19/2024 FINAL  Final  Culture, blood (Routine X 2)  w Reflex to ID Panel     Status: None (Preliminary result)   Collection Time: 05/14/24  6:09 PM   Specimen: BLOOD RIGHT HAND  Result Value Ref Range Status   Specimen Description   Final    BLOOD RIGHT HAND Performed at [facility], [address], [place], [place] [address]    Special Requests   Final    BOTTLES DRAWN AEROBIC AND ANAEROBIC Blood Culture adequate volume Performed at [facility], [address] [place], [place] [address]    Culture   Setup Time   Final    GRAM POSITIVE RODS AEROBIC BOTTLE ONLY CRITICAL RESULT CALLED TO, READ BACK BY AND VERIFIED WITH: PHARMD E. JACKSON [id number] @ 2322 FH    Culture   Final    GRAM POSITIVE RODS SENT TO [facility] FOR REFERENCE ID Performed at [facility], [address], [place], [place] [address]    Report Status PENDING  Incomplete    Labs: CBC: Recent Labs  Lab 05/18/24 0623 05/19/24 0528 05/20/24 0446 05/21/24 0418 05/22/24 0522  WBC 7.8 7.0 7.8 8.9 7.6  HGB 12.9* 14.0 14.5 13.2 14.0  HCT 38.2* 42.5 44.8 40.6 43.9  MCV 89.0 91.0 91.8 91.0 90.7  PLT 464* 462* 472* 479* 494*   Basic Metabolic Panel: Recent Labs  Lab 05/16/24 0306 05/18/24 0623 05/19/24 0528 05/20/24 0446 05/21/24 0418 05/22/24 0522  NA 135 136 136 135 132* 137  K 3.6 3.3* 4.2 4.0 3.7 3.9  CL 102 101 100 100 98 99  CO2 22 27 24 28 28 28   GLUCOSE 233* 151* 213* 134* 104* 131*  BUN 15 15 14 13 15 15   CREATININE 0.56* 0.74 0.67 0.74 0.81 0.73  CALCIUM  8.2* 8.6* 8.6* 8.8* 8.4* 9.0  MG 2.2 2.2 2.2 2.2 2.3 2.3  PHOS 3.2 4.8*  --   --   --   --    Liver Function Tests: No results for input(s): AST, ALT, ALKPHOS, BILITOT, PROT, ALBUMIN  in the last 168 hours. CBG: Recent Labs  Lab 05/21/24 1200 05/21/24 1620 05/21/24 1955 05/22/24 0119 05/22/24 0729  GLUCAP 325* 176* 137* 71 255*    Discharge time spent: greater than 30 minutes.  Signed: True Fuss, MD [facility] 05/22/2024

## 2024-05-22 NOTE — Progress Notes (Signed)
 [facility]    Provider:  Dr Tresia Fruit Requesting Provider: Rosslyn Coons, MD Primary Care Provider:  Rosslyn Coons, MD  CC:  memory changes, just discharged from hospital  HPI:  Willie [name] is a 63 y.o. male here as requested by Rosslyn Coons, MD for memory changes. has DIABETES, TYPE 1; Hyperlipidemia; OBESITY; Generalized anxiety disorder; Chronic depression; OBSTRUCTIVE SLEEP APNEA; HYPERTENSION; G E R D; PULMONARY SARCOIDOSIS; COUGH; S/P cervical spinal fusion; Acute metabolic encephalopathy; Worsening short-term memory loss; Epidural hematoma (HCC); Chronic pain syndrome; Cervical spinal stenosis s/p fusion 5/7; History of gait instability; Hypothyroidism; Mild cognitive impairment; Insulin  dependent type 2 diabetes mellitus (HCC); History of choking; Altered mental status, unspecified; and DKA (diabetic ketoacidosis) (HCC) on their problem list.  Patient and his family came directly from hospital for appointment for memory changes, report he had delirium while inpatient. Has a diagnosis of possibly Lewy Body Dementia/cognitive impairment. 4 years ago he started having memory problems. Her with sister in law and wife. Started with short-term memory loss. They have been married 41 years. It slowly progressed over the years. Since then he had REM sleep disorder and not using his cpap. He would forget wat kind of the week it is. Not driving for anymore. He had surgery for the acdf and he started hallucinating and saw visitors. The net couple days was difficult. He would slidw out and agitated.  Ativan  and haldol  made him agitated and pit him on medications. He falls asleep a lot. Difficulty with changing lights. He'll get in the shower and dress. They have a lot of stairs in the home. Developed a tremor. Diagnosed as essential tremor. Imbalance, shuffling. No history of hallucinations at home. Maybe some rem sleep disorder. Cutting the grass. Falls at home. Imbalance. He has  been retired 10 years ago and he couldn;t remember names. He has a hard time time with the remote. Not a lot of going out, home bodies. More paranoid since the surgery.   Reviewed notes, labs and imaging from outside physicians, which showed: I reviewed notes from epic.  Patient was admitted on May 01, 2024 for cervical stenosis and taken to the operating room where he underwent ACDF C5-6.  Per Dr. Rochelle Chu notes on discharge he had tolerated the procedure well.  Dr. Chase Copping notes on May 07, 2024 from the emergency room patient was on muscle relaxants as well as pain medications oxycodone and since the surgery having increasing confusion, delusions, hallucinations.  He was admitted for confusion, initial scan showed some epidural hematoma, on 520 due to worsening blood pressure was transferred to ICU and additionally suffering from DKA and also placed on insulin  drip, labile blood sugars per Dr. Avery Lema notes on discharge May 22, 2024, and hospital course was complicated by frequent agitation, neurosurgery and neurology were consulted, neurology felt patient possibly suffering from Lewy body dementia and they initiated an atypical antipsychotic and he was discharged on Zyprexa .  MRI Brain(reviewed notes): Motion degraded exam.  No acute intracranial abnormality.  Nonspecific lateral ventriculomegaly-otherwise negative for age noncontrast MRI of the brain.  Reviewed labs unremarkable tsh 5.207(f/u pcp), B12 elevated 2439,   EEG suggestive of moderate diffuse encephalopathy. No seizures or epileptiform discharges were seen throughout the recording.  Review of Systems: Patient complains of symptoms per HPI as well as the following symptoms confusion, agitation. Pertinent negatives and positives per HPI. All others negative.   Social History   Socioeconomic History   Marital status: Married    Spouse name:  Not on file   Number of children: Not on file   Years of education: Not on file   Highest  education level: Not on file  Occupational History   Occupation: disability    Comment: firefighter -   Tobacco Use   Smoking status: Never   Smokeless tobacco: Former    Quit date: 11/14/2003   Tobacco comments:    Pt quit dip in 2004  Vaping Use   Vaping status: Never Used  Substance and Sexual Activity   Alcohol use: Not Currently    Comment: rare (less than one every couple months)   Drug use: Never   Sexual activity: Yes  Other Topics Concern   Not on file  Social History Narrative   Not on file   Social Drivers of Health   Financial Resource Strain: Not on file  Food Insecurity: No Food Insecurity (05/16/2024)   Hunger Vital Sign    Worried About Running Out of Food in the Last Year: Never true    Ran Out of Food in the Last Year: Never true  Transportation Needs: No Transportation Needs (05/16/2024)   PRAPARE - Administrator, Civil Service (Medical): No    Lack of Transportation (Non-Medical): No  Physical Activity: Not on file  Stress: Not on file  Social Connections: Not on file  Intimate Partner Violence: Not At Risk (05/16/2024)   Humiliation, Afraid, Rape, and Kick questionnaire    Fear of Current or Ex-Partner: No    Emotionally Abused: No    Physically Abused: No    Sexually Abused: No    Family History  Problem Relation Age of Onset   Tremor Mother    Thyroid disease Mother    CAD Father    COPD Father    Other Brother        unknown   Healthy Son     Past Medical History:  Diagnosis Date   Anxiety    Arthritis    BPH (benign prostatic hyperplasia)    Depression    Diabetes (HCC)    Diabetic neuropathy (HCC)    Diabetic retinopathy (HCC)    Esophageal stricture    Fibromyalgia    GERD (gastroesophageal reflux disease)    History of kidney stones    Hyperlipidemia    Hypertension    Hypothyroidism    Melanoma (HCC)    Nuclear sclerosis of both eyes    Obesity    OSA (obstructive sleep apnea)    Polyneuropathy     Refractive amblyopia of right eye    Tremor     Patient Active Problem List   Diagnosis Date Noted   Acute metabolic encephalopathy 05/08/2024   Worsening short-term memory loss 05/08/2024   Epidural hematoma (HCC) 05/08/2024   Chronic pain syndrome 05/08/2024   Cervical spinal stenosis s/p fusion 5/7 05/08/2024   History of gait instability 05/08/2024   Hypothyroidism 05/08/2024   Mild cognitive impairment 05/08/2024   Insulin  dependent type 2 diabetes mellitus (HCC) 05/08/2024   History of choking 05/08/2024   Altered mental status, unspecified 05/08/2024   DKA (diabetic ketoacidosis) (HCC) 05/08/2024   S/P cervical spinal fusion 05/01/2024   PULMONARY SARCOIDOSIS 12/30/2010   COUGH 12/30/2010   OBSTRUCTIVE SLEEP APNEA 05/16/2008   DIABETES, TYPE 1 05/15/2008   Hyperlipidemia 05/15/2008   OBESITY 05/15/2008   Generalized anxiety disorder 05/15/2008   Chronic depression 05/15/2008   HYPERTENSION 05/15/2008   G E R D 05/15/2008    Past Surgical  History:  Procedure Laterality Date   ANTERIOR CERVICAL DECOMP/DISCECTOMY FUSION N/A 05/01/2024   Procedure: ANTERIOR CERVICAL DECOMPRESSION/DISCECTOMY FUSION  Cervical Five-Cervical Six, remove tether plate Cervical Six-Seven;  Surgeon: Joaquin Mulberry, MD;  Location: [facility] OR;  Service: Neurosurgery;  Laterality: N/A;  ACDF - C3-C4 - C4-C5 - C5-C6, remove tether plate M5-7   CARPAL TUNNEL RELEASE Bilateral 2002   CATARACT EXTRACTION Bilateral    2023   CERVICAL DISC SURGERY  2005   CYST EXCISION  2002   middle finger   CYST EXCISION  2007   left thigh   ESOPHAGEAL DILATION     MELANOMA EXCISION     2016 over chest   ROTATOR CUFF REPAIR Left    TRIGGER FINGER RELEASE Bilateral     Current Outpatient Medications  Medication Sig Dispense Refill   Ascorbic Acid (VITAMIN C) 1000 MG tablet Take 1,000 mg by mouth daily.     buPROPion  (WELLBUTRIN  SR) 150 MG 12 hr tablet Take 150 mg by mouth 2 (two) times daily.      busPIRone   (BUSPAR ) 10 MG tablet Take 10 mg by mouth 2 (two) times daily.     carvedilol  (COREG ) 12.5 MG tablet Take 1 tablet (12.5 mg total) by mouth 2 (two) times daily with a meal. 90 tablet 0   cloNIDine  (CATAPRES  - DOSED IN MG/24 HR) 0.3 mg/24hr patch Place 1 patch (0.3 mg total) onto the skin once a week. 12 patch 0   ezetimibe  (ZETIA ) 10 MG tablet Take 10 mg by mouth daily.     furosemide (LASIX) 20 MG tablet Take 20 mg by mouth 2 (two) times daily.     gabapentin  (NEURONTIN ) 300 MG capsule Take 300 mg by mouth 2 (two) times daily.     HYDROcodone -acetaminophen  (NORCO) 10-325 MG tablet Take 1 tablet by mouth every 4 (four) hours as needed for moderate pain (pain score 4-6). 40 tablet 0   insulin  aspart (NOVOLOG ) 100 UNIT/ML injection Inject 7 Units into the skin 3 (three) times daily with meals. 10 mL 11   insulin  glargine-yfgn (SEMGLEE ) 100 UNIT/ML injection Inject 0.2 mLs (20 Units total) into the skin every 12 (twelve) hours. 10 mL 11   levothyroxine  (SYNTHROID ) 112 MCG tablet Take 112 mcg by mouth 2 (two) times daily.     OLANZapine  zydis (ZYPREXA ) 5 MG disintegrating tablet Take 1 tablet (5 mg total) by mouth in the morning and at bedtime. 60 tablet 0   omeprazole  (PRILOSEC) 40 MG capsule TAKE ONE CAPSULE BY MOUTH EVERY MORNING AND EVERY EVENING 60 capsule 11   rosuvastatin  (CRESTOR ) 10 MG tablet Take 10 mg by mouth 3 (three) times a week.     tiZANidine  (ZANAFLEX ) 4 MG tablet Take 1 tablet (4 mg total) by mouth every 8 (eight) hours as needed for muscle spasms. 90 tablet 1   traMADol (ULTRAM) 50 MG tablet Take 50 mg by mouth 3 (three) times daily.     No current facility-administered medications for this visit.    Allergies as of 05/22/2024 - Review Complete 05/22/2024  Allergen Reaction Noted   Other Other (See Comments), Shortness Of Breath, and Swelling 07/22/2019   Latex Rash 04/10/2024    Vitals: BP 134/77 (Cuff Size: Normal)   Pulse 97   SpO2 97%  Last Weight:  Wt Readings from  Last 1 Encounters:  05/16/24 207 lb 0.2 oz (93.9 kg)   Last Height:   Ht Readings from Last 1 Encounters:  05/07/24 5\' 9"  (1.753 m)  Physical exam: Exam: Gen: NAD                    CV: RRR, no MRG. No Carotid Bruits. Eyes: Conjunctivae clear without exudates or hemorrhage  Neuro: Detailed Neurologic Exam  Speech:    Speech appears without aphasia or dysarthria Cognition:    05/22/2024    1:45 PM  MMSE - Mini Mental State Exam  Orientation to time 2  Orientation to Place 4  Registration 3  Attention/ Calculation 0  Recall 0  Language- name 2 objects 2  Language- repeat 1  Language- follow 3 step command 2  Language- read & follow direction 1  Write a sentence 0  Copy design 0  Total score 15      The pupils are equal, round, and reactive to light. The fundi are normal and spontaneous venous pulsations are present. Visual fields are full to finger confrontation. Extraocular movements are intact. Trigeminal sensation is intact and the muscles of mastication are normal. The face is symmetric. The palate elevates in the midline. Hearing intact. Voice is normal. Shoulder shrug is normal. The tongue has normal motion without fasciculations.   Coordination: Impaired   Gait: In wheelchair, difficulty walking brief walk showed shuffling gait  Motor Observation: Resting and postural tremor Tone: increased  Posture:    Posture is normal. normal erect    Strength:    Strength is equal and antigravity in the upper and lower limbs, difficult to perform a thorough examination at this time due to mentation     Sensation: intact to LT     Reflex Exam:  DTR's:    Deep tendon reflexes in the upper and lower extremities are symmetrical bilaterally.   Toes:    The toes are equiv bilaterally.   Clonus:    Clonus is absent.    Assessment/Plan:  Patient was admitted on May 01, 2024 for cervical stenosis and taken to the operating room where he underwent ACDF C5-6.  Per  Dr. Rochelle Chu notes on discharge he had tolerated the procedure well.  Dr. Chase Copping notes on May 07, 2024 from the emergency room patient was on muscle relaxants as well as pain medications oxycodone and since the surgery having increasing confusion, delusions, hallucinations.  He was admitted for confusion, initial scan showed some epidural hematoma, on 520 due to worsening blood pressure was transferred to ICU and additionally suffering from DKA a/nd also placed on insulin  drip, labile blood sugars per Dr. Avery Lema notes on discharge May 22, 2024, and hospital course was complicated by frequent agitation, neurosurgery and neurology were consulted, neurology felt patient possibly suffering from Lewy body dementia and they initiated an atypical antipsychotic and he was discharged on Zyprexa .  .has DIABETES, TYPE 1; Hyperlipidemia; OBESITY; Generalized anxiety disorder; Chronic depression; OBSTRUCTIVE SLEEP APNEA; HYPERTENSION; G E R D; PULMONARY SARCOIDOSIS; COUGH; S/P cervical spinal fusion; Acute metabolic encephalopathy; Worsening short-term memory loss; Epidural hematoma (HCC); Chronic pain syndrome; Cervical spinal stenosis s/p fusion 5/7; History of gait instability; Hypothyroidism; Mild cognitive impairment; Insulin  dependent type 2 diabetes mellitus (HCC); History of choking; Altered mental status, unspecified; and DKA (diabetic ketoacidosis) (HCC) on their problem list.    - patient with a hx of cognitive impairment, tremor dxed as possible ET by neurology Dr. Winferd Hatter 2019 but recently more parkinsonian such as shuffling gait, rem sleep disorder symptoms, possible diagnosis of Lewy body dementia/cognitive impairment and family literally came straight to the office after discharge s/p ACDF 5/7 then readmission  5/13 for altered mental status, acute metabolic encephalopathy, delirium, delusions after surgery while on pain medications at home.   - Neurology consulted inpatient and performed an evaluation, mri  brain unremarkable for focal/acute event. EEG suggestive of moderate diffuse encephalopathy. No seizures or epileptiform discharges were seen throughout the recording. B12, tsh tested. 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   - He is parkinsonian on exam, at this time very difficult to assess after just having 2 recent admissions and coming straight from hospital admission to appointment. My recommendations were to continue Zyprexa  for agitation, discontinue any medications that patient does not need that may be additive to his confusion such as opioids and return at a later date. We did speak about possibility of more testing but at this time, after such recent admissions s/p surgery and complications, memory testing unreliable (MMSE today in office 15/30)   Cc: Rosslyn Coons, MD,  Rosslyn Coons, MD  Aldona Amel, MD  [facility] [address] [place], [place] [address]  Phone [phone] Fax [phone]  I spent over 60 minutes of face-to-face and non-face-to-face time with patient on the  1. Cognitive impairment   2. Parkinsonian features   3. Delirium    diagnosis.  This included previsit chart review, lab review, study review, order entry, electronic health record documentation, patient education on the different diagnostic and therapeutic options, counseling and coordination of care, risks and benefits of management, compliance, or risk factor reduction

## 2024-05-22 NOTE — TOC Transition Note (Signed)
 Transition of Care [facility]) - Discharge Note  Patient Details  Name: Willie [name] MRN: [id number] Date of Birth: July 29, 1961  Transition of Care [facility]) CM/SW Contact:  Zenon Hilda, LCSW Phone Number: 05/22/2024, 10:42 AM  Clinical Narrative: Patient will discharge home today. CSW notified Loetta Ringer with [employer] and Matt with [employer] of discharge. Chest x-ray results send to [facility] as this is required for LTC placement. TOC signing off.  Final next level of care: Home w Home Health Services Barriers to Discharge: Barriers Resolved  Patient Goals and CMS Choice Patient states their goals for this hospitalization and ongoing recovery are:: Get long-term care CMS Medicare.gov Compare Post Acute Care list provided to:: Patient Represenative (must comment) Rogene Claude (spouse)) Choice offered to / list presented to : Spouse  Discharge Plan and Services Additional resources added to the After Visit Summary for   In-house Referral: Clinical Social Work        DME Arranged: N/A DME Agency: NA HH Arranged: PT, OT HH Agency: [facility] Date HH Agency Contacted: 05/22/24 Time HH Agency Contacted: 1041 Representative spoke with at [facility] Agency: Loetta Ringer  Social Drivers of Health (SDOH) Interventions SDOH Screenings   Food Insecurity: No Food Insecurity (05/16/2024)  Housing: Low Risk  (05/16/2024)  Transportation Needs: No Transportation Needs (05/16/2024)  Utilities: Not At Risk (05/16/2024)  Tobacco Use: Medium Risk (05/08/2024)   Readmission Risk Interventions    05/16/2024    2:42 PM  Readmission Risk Prevention Plan  Transportation Screening Complete  PCP or Specialist Appt within 5-7 Days Complete  Home Care Screening Complete  Medication Review (RN CM) Complete

## 2024-05-23 ENCOUNTER — Encounter: Payer: Self-pay | Admitting: Neurology

## 2024-05-23 NOTE — Telephone Encounter (Signed)
 I called pts wife.  She was not able to get the zyprexa  last night from pharmacy, but has it now.  She will go ahead and  give it and will see how he does and know that she can give it bid if she needs to.  She will let us  know on Monday how he is.  Dr. Jesse Moritz did sign for [facility].  She asks if going to facility will make him worse then better.  I told her that changes definitely make things more challenging with dementia pts.   It is safety issue as well for her and pt.  She expressed thankfulness/ appreciation to calling.  Dr. Tresia Fruit aware.

## 2024-05-24 LAB — QUANTIFERON-TB GOLD PLUS (RQFGPL)
QuantiFERON Mitogen Value: 2.93 [IU]/mL
QuantiFERON Nil Value: 0.04 [IU]/mL
QuantiFERON TB1 Ag Value: 0.05 [IU]/mL
QuantiFERON TB2 Ag Value: 0.04 [IU]/mL

## 2024-05-24 LAB — QUANTIFERON-TB GOLD PLUS: QuantiFERON-TB Gold Plus: NEGATIVE

## 2024-06-05 ENCOUNTER — Encounter: Payer: Self-pay | Admitting: Neurology

## 2024-06-06 ENCOUNTER — Other Ambulatory Visit: Payer: Self-pay | Admitting: Neurology

## 2024-06-06 MED ORDER — OLANZAPINE 5 MG PO TBDP: ORAL_TABLET | ORAL | 6 refills | Status: AC

## 2024-06-10 ENCOUNTER — Other Ambulatory Visit (HOSPITAL_COMMUNITY): Payer: Self-pay

## 2024-06-10 ENCOUNTER — Telehealth: Payer: Self-pay

## 2024-06-10 DIAGNOSIS — F03918 Unspecified dementia, unspecified severity, with other behavioral disturbance: Secondary | ICD-10-CM

## 2024-06-10 MED ORDER — OLANZAPINE 10 MG PO TABS: ORAL_TABLET | ORAL | 6 refills | Status: AC

## 2024-06-10 NOTE — Telephone Encounter (Signed)
 Max dose per day is Two (2) tablets per day. Request is for three (3) tablets per day-insurance is asking for rational-Please advise-Thanks!

## 2024-06-10 NOTE — Telephone Encounter (Signed)
 The zyprexa  5mg  am and 10mg  pm, (not approved (need rationalization for why needs to take 3 tabs daily).  See 06-05-2024 note. (Using for not sleeping)

## 2024-06-10 NOTE — Telephone Encounter (Signed)
 Please let patient know(or his wife, patient has dementia), insurance would not let us  prescribe the 5mg  tab so we have to change it to 10mg  tab. In this case, they will take 1/2 tablet in the morning (5mg ) and a whole tablet at bedtime(10mg ).

## 2024-06-11 ENCOUNTER — Other Ambulatory Visit (HOSPITAL_COMMUNITY): Payer: Self-pay

## 2024-06-11 NOTE — Telephone Encounter (Signed)
 I wouldn't take amitriptyline . 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 We can also refer to psychiatry as they are specialists in managing medications for behavior problems and mood disorders and sleep problems and managing the interactions between all the different medications he has. I would recommend referral to psychiatry and I am happy to refer. (CC Dr. Jesse Moritz)

## 2024-06-11 NOTE — Telephone Encounter (Signed)
 Noted-Ran test claim for 10mg  Tablet and went thru with just a Refill Too Soon rejection-No PA needed.

## 2024-06-11 NOTE — Telephone Encounter (Signed)
 Called wife's phone Abran Abrahams, on [place]) and she had me on speaker with the patient. She states she has the Zyprexa  5 mg at home and patient has been taking 5 mg at bedtime since it was filled (looks like that was on 5/28). She increased the dose to 10 mg one night but patient was unable to tolerate. He was foggy and drugged like the next morning. She said it hasn't helped his sleep. He continues to take 5 mg nightly which he tolerates but he only sleeps 1-2 hours. Patient and wife are asking if patient is able to take the Amitriptyline  again or would he have an adverse effect. The Amitriptyline  is the only thing that has really worked for the patient but the hospital had him stop it when he was admitted back on 05/07/24. Both patient and wife gave me verbal permission to leave a detailed vm if they do not answer when I call back about this (call back # [phone]).

## 2024-06-12 NOTE — Telephone Encounter (Signed)
 Spoke with pt's wife and patient. Wife understands that Dr Tresia Fruit does not recommend Amitriptyline . Patient is not happy about this. Patient and wife did agree to referral to psychiatry but patient thinks we are sending him for therapy. I did inform him that this was not to send him to a therapist but for medication management so they can help him find the best medication to help him sleep that would also be safe to take in combination with his other medications. Wife has asked for [facility]. Patient slept 10 pm to 2 am last night. She states he doesn't sleep during the day at all. They get out of the house to run errands most days. She had me on speaker phone for the majority of the call. Wife indicates patient isn't very nice. She is aware that if she were to feel unsafe with patient to call 911. She thanked me for the call. She said if patient doesn't end up wanting to see psychiatry they may cancel the appt but they will see Dr Tresia Fruit on 7/10 as already scheduled.

## 2024-06-13 NOTE — Telephone Encounter (Signed)
 Referral placed - thank you

## 2024-06-13 NOTE — Addendum Note (Signed)
 Addended by: Won Kreuzer B on: 06/13/2024 07:59 AM   Modules accepted: Orders

## 2024-06-14 LAB — MISC LABCORP TEST (SEND OUT): Labcorp test code: 183402

## 2024-06-19 LAB — CULTURE, BLOOD (ROUTINE X 2): Special Requests: ADEQUATE

## 2024-07-01 ENCOUNTER — Telehealth: Payer: Self-pay | Admitting: Neurology

## 2024-07-01 NOTE — Telephone Encounter (Signed)
 Referral for psychiatry fax to [facility]. Phone: [phone], Fax: [phone] Referral had been sent through [facility] had not received referral.

## 2024-07-01 NOTE — Telephone Encounter (Signed)
 [facility], they did not have referral that had been sent through [facility].  Fax referral for psychiatry to [facility] to see Dr. Tasia. Phone: [phone], Fax: [phone]

## 2024-07-04 ENCOUNTER — Telehealth: Payer: Self-pay | Admitting: Neurology

## 2024-07-04 ENCOUNTER — Telehealth: Admitting: Neurology

## 2024-07-04 NOTE — Telephone Encounter (Signed)
 Patient scheduled for VV with Dr. Ines for 07/18/24 at 11:30am

## 2024-07-04 NOTE — Telephone Encounter (Signed)
 Patient called to check to make sure she is on MyChart and ready to go. She said she is looking at herself and link to join meeting is at the bottom. Informed patient to  click join meeting minutes before appointment time at 11:00 am and stay on MyChart until provider gets on.

## 2024-07-04 NOTE — Telephone Encounter (Signed)
 LVM and sent mychart msg offering for patient to do a video visit today instead of coming in per Dr. Ines

## 2024-07-04 NOTE — Progress Notes (Deleted)
 [facility]    Provider:  Dr Ines Requesting Provider: Nichole Senior, MD Primary Care Provider:  Nichole Senior, MD  CC:  memory changes, just discharged from hospital  HPI:  Willie [name] is a 63 y.o. male here as requested by Nichole Senior, MD for memory changes. has DIABETES, TYPE 1; Hyperlipidemia; OBESITY; Generalized anxiety disorder; Chronic depression; OBSTRUCTIVE SLEEP APNEA; HYPERTENSION; G E R D; PULMONARY SARCOIDOSIS; COUGH; S/P cervical spinal fusion; Acute metabolic encephalopathy; Worsening short-term memory loss; Epidural hematoma (HCC); Chronic pain syndrome; Cervical spinal stenosis s/p fusion 5/7; History of gait instability; Hypothyroidism; Mild cognitive impairment; Insulin  dependent type 2 diabetes mellitus (HCC); History of choking; Altered mental status, unspecified; and DKA (diabetic ketoacidosis) (HCC) on their problem list.  Patient and his family came directly from hospital for appointment for memory changes, report he had delirium while inpatient. Has a diagnosis of possibly Lewy Body Dementia/cognitive impairment. 4 years ago he started having memory problems. Her with sister in law and wife. Started with short-term memory loss. They have been married 41 years. It slowly progressed over the years. Since then he had REM sleep disorder and not using his cpap. He would forget wat kind of the week it is. Not driving for anymore. He had surgery for the acdf and he started hallucinating and saw visitors. The net couple days was difficult. He would slidw out and agitated.  Ativan  and haldol  made him agitated and pit him on medications. He falls asleep a lot. Difficulty with changing lights. He'll get in the shower and dress. They have a lot of stairs in the home. Developed a tremor. Diagnosed as essential tremor. Imbalance, shuffling. No history of hallucinations at home. Maybe some rem sleep disorder. Cutting the grass. Falls at home. Imbalance. He has  been retired 10 years ago and he couldn;t remember names. He has a hard time time with the remote. Not a lot of going out, home bodies. More paranoid since the surgery.   Reviewed notes, labs and imaging from outside physicians, which showed: I reviewed notes from epic.  Patient was admitted on May 01, 2024 for cervical stenosis and taken to the operating room where he underwent ACDF C5-6.  Per Dr. Joshua notes on discharge he had tolerated the procedure well.  Dr. Pixie notes on May 07, 2024 from the emergency room patient was on muscle relaxants as well as pain medications oxycodone and since the surgery having increasing confusion, delusions, hallucinations.  He was admitted for confusion, initial scan showed some epidural hematoma, on 520 due to worsening blood pressure was transferred to ICU and additionally suffering from DKA and also placed on insulin  drip, labile blood sugars per Dr. Benn notes on discharge May 22, 2024, and hospital course was complicated by frequent agitation, neurosurgery and neurology were consulted, neurology felt patient possibly suffering from Lewy body dementia and they initiated an atypical antipsychotic and he was discharged on Zyprexa .  MRI Brain(reviewed notes): Motion degraded exam.  No acute intracranial abnormality.  Nonspecific lateral ventriculomegaly-otherwise negative for age noncontrast MRI of the brain.  Reviewed labs unremarkable tsh 5.207(f/u pcp), B12 elevated 2439,   EEG suggestive of moderate diffuse encephalopathy. No seizures or epileptiform discharges were seen throughout the recording.  Review of Systems: Patient complains of symptoms per HPI as well as the following symptoms confusion, agitation. Pertinent negatives and positives per HPI. All others negative.   Social History   Socioeconomic History   Marital status: Married    Spouse name:  Not on file   Number of children: Not on file   Years of education: Not on file   Highest  education level: Not on file  Occupational History   Occupation: disability    Comment: firefighter -   Tobacco Use   Smoking status: Never   Smokeless tobacco: Former    Quit date: 11/14/2003   Tobacco comments:    Pt quit dip in 2004  Vaping Use   Vaping status: Never Used  Substance and Sexual Activity   Alcohol use: Not Currently    Comment: rare (less than one every couple months)   Drug use: Never   Sexual activity: Yes  Other Topics Concern   Not on file  Social History Narrative   Not on file   Social Drivers of Health   Financial Resource Strain: Not on file  Food Insecurity: No Food Insecurity (05/16/2024)   Hunger Vital Sign    Worried About Running Out of Food in the Last Year: Never true    Ran Out of Food in the Last Year: Never true  Transportation Needs: No Transportation Needs (05/16/2024)   PRAPARE - Administrator, Civil Service (Medical): No    Lack of Transportation (Non-Medical): No  Physical Activity: Not on file  Stress: Not on file  Social Connections: Not on file  Intimate Partner Violence: Not At Risk (05/16/2024)   Humiliation, Afraid, Rape, and Kick questionnaire    Fear of Current or Ex-Partner: No    Emotionally Abused: No    Physically Abused: No    Sexually Abused: No    Family History  Problem Relation Age of Onset   Tremor Mother    Thyroid disease Mother    CAD Father    COPD Father    Other Brother        unknown   Healthy Son     Past Medical History:  Diagnosis Date   Anxiety    Arthritis    BPH (benign prostatic hyperplasia)    Depression    Diabetes (HCC)    Diabetic neuropathy (HCC)    Diabetic retinopathy (HCC)    Esophageal stricture    Fibromyalgia    GERD (gastroesophageal reflux disease)    History of kidney stones    Hyperlipidemia    Hypertension    Hypothyroidism    Melanoma (HCC)    Nuclear sclerosis of both eyes    Obesity    OSA (obstructive sleep apnea)    Polyneuropathy     Refractive amblyopia of right eye    Tremor     Patient Active Problem List   Diagnosis Date Noted   Acute metabolic encephalopathy 05/08/2024   Worsening short-term memory loss 05/08/2024   Epidural hematoma (HCC) 05/08/2024   Chronic pain syndrome 05/08/2024   Cervical spinal stenosis s/p fusion 5/7 05/08/2024   History of gait instability 05/08/2024   Hypothyroidism 05/08/2024   Mild cognitive impairment 05/08/2024   Insulin  dependent type 2 diabetes mellitus (HCC) 05/08/2024   History of choking 05/08/2024   Altered mental status, unspecified 05/08/2024   DKA (diabetic ketoacidosis) (HCC) 05/08/2024   S/P cervical spinal fusion 05/01/2024   PULMONARY SARCOIDOSIS 12/30/2010   COUGH 12/30/2010   OBSTRUCTIVE SLEEP APNEA 05/16/2008   DIABETES, TYPE 1 05/15/2008   Hyperlipidemia 05/15/2008   OBESITY 05/15/2008   Generalized anxiety disorder 05/15/2008   Chronic depression 05/15/2008   HYPERTENSION 05/15/2008   G E R D 05/15/2008    Past Surgical  History:  Procedure Laterality Date   ANTERIOR CERVICAL DECOMP/DISCECTOMY FUSION N/A 05/01/2024   Procedure: ANTERIOR CERVICAL DECOMPRESSION/DISCECTOMY FUSION  Cervical Five-Cervical Six, remove tether plate Cervical Six-Seven;  Surgeon: Joshua Alm Hamilton, MD;  Location: [facility] OR;  Service: Neurosurgery;  Laterality: N/A;  ACDF - C3-C4 - C4-C5 - C5-C6, remove tether plate R3-2   CARPAL TUNNEL RELEASE Bilateral 2002   CATARACT EXTRACTION Bilateral    2023   CERVICAL DISC SURGERY  2005   CYST EXCISION  2002   middle finger   CYST EXCISION  2007   left thigh   ESOPHAGEAL DILATION     MELANOMA EXCISION     2016 over chest   ROTATOR CUFF REPAIR Left    TRIGGER FINGER RELEASE Bilateral     Current Outpatient Medications  Medication Sig Dispense Refill   Ascorbic Acid (VITAMIN C) 1000 MG tablet Take 1,000 mg by mouth daily.     buPROPion  (WELLBUTRIN  SR) 150 MG 12 hr tablet Take 150 mg by mouth 2 (two) times daily.      busPIRone   (BUSPAR ) 10 MG tablet Take 10 mg by mouth 2 (two) times daily.     carvedilol  (COREG ) 12.5 MG tablet Take 1 tablet (12.5 mg total) by mouth 2 (two) times daily with a meal. 90 tablet 0   cloNIDine  (CATAPRES  - DOSED IN MG/24 HR) 0.3 mg/24hr patch Place 1 patch (0.3 mg total) onto the skin once a week. 12 patch 0   ezetimibe  (ZETIA ) 10 MG tablet Take 10 mg by mouth daily.     furosemide (LASIX) 20 MG tablet Take 20 mg by mouth 2 (two) times daily.     gabapentin  (NEURONTIN ) 300 MG capsule Take 300 mg by mouth 2 (two) times daily.     HYDROcodone -acetaminophen  (NORCO) 10-325 MG tablet Take 1 tablet by mouth every 4 (four) hours as needed for moderate pain (pain score 4-6). 40 tablet 0   insulin  aspart (NOVOLOG ) 100 UNIT/ML injection Inject 7 Units into the skin 3 (three) times daily with meals. 10 mL 11   insulin  glargine-yfgn (SEMGLEE ) 100 UNIT/ML injection Inject 0.2 mLs (20 Units total) into the skin every 12 (twelve) hours. 10 mL 11   levothyroxine  (SYNTHROID ) 112 MCG tablet Take 112 mcg by mouth 2 (two) times daily.     OLANZapine  (ZYPREXA ) 10 MG tablet Take 1/2 tablet in the morning (5mg ) and a whole tablet at bedtime(10mg ) 45 tablet 6   OLANZapine  zydis (ZYPREXA ) 5 MG disintegrating tablet Take 1 in the morning(5mg ) and 2 at bedtime(10mg ) 90 tablet 6   omeprazole  (PRILOSEC) 40 MG capsule TAKE ONE CAPSULE BY MOUTH EVERY MORNING AND EVERY EVENING 60 capsule 11   rosuvastatin  (CRESTOR ) 10 MG tablet Take 10 mg by mouth 3 (three) times a week.     tiZANidine  (ZANAFLEX ) 4 MG tablet Take 1 tablet (4 mg total) by mouth every 8 (eight) hours as needed for muscle spasms. 90 tablet 1   traMADol (ULTRAM) 50 MG tablet Take 50 mg by mouth 3 (three) times daily.     No current facility-administered medications for this visit.    Allergies as of 07/04/2024 - Review Complete 05/22/2024  Allergen Reaction Noted   Other Other (See Comments), Shortness Of Breath, and Swelling 07/22/2019   Latex Rash  04/10/2024    Vitals: There were no vitals taken for this visit. Last Weight:  Wt Readings from Last 1 Encounters:  05/16/24 207 lb 0.2 oz (93.9 kg)   Last Height:  Ht Readings from Last 1 Encounters:  05/07/24 5' 9 (1.753 m)     Physical exam: Exam: Gen: NAD                    CV: RRR, no MRG. No Carotid Bruits. Eyes: Conjunctivae clear without exudates or hemorrhage  Neuro: Detailed Neurologic Exam  Speech:    Speech appears without aphasia or dysarthria Cognition:    05/22/2024    1:45 PM  MMSE - Mini Mental State Exam  Orientation to time 2  Orientation to Place 4  Registration 3  Attention/ Calculation 0  Recall 0  Language- name 2 objects 2  Language- repeat 1  Language- follow 3 step command 2  Language- read & follow direction 1  Write a sentence 0  Copy design 0  Total score 15      The pupils are equal, round, and reactive to light. The fundi are normal and spontaneous venous pulsations are present. Visual fields are full to finger confrontation. Extraocular movements are intact. Trigeminal sensation is intact and the muscles of mastication are normal. The face is symmetric. The palate elevates in the midline. Hearing intact. Voice is normal. Shoulder shrug is normal. The tongue has normal motion without fasciculations.   Coordination: Impaired   Gait: In wheelchair, difficulty walking brief walk showed shuffling gait  Motor Observation: Resting and postural tremor Tone: increased  Posture:    Posture is normal. normal erect    Strength:    Strength is equal and antigravity in the upper and lower limbs, difficult to perform a thorough examination at this time due to mentation     Sensation: intact to LT     Reflex Exam:  DTR's:    Deep tendon reflexes in the upper and lower extremities are symmetrical bilaterally.   Toes:    The toes are equiv bilaterally.   Clonus:    Clonus is absent.    Assessment/Plan:  Patient was admitted on  May 01, 2024 for cervical stenosis and taken to the operating room where he underwent ACDF C5-6.  Per Dr. Joshua notes on discharge he had tolerated the procedure well.  Dr. Pixie notes on May 07, 2024 from the emergency room patient was on muscle relaxants as well as pain medications oxycodone and since the surgery having increasing confusion, delusions, hallucinations.  He was admitted for confusion, initial scan showed some epidural hematoma, on 520 due to worsening blood pressure was transferred to ICU and additionally suffering from DKA a/nd also placed on insulin  drip, labile blood sugars per Dr. Benn notes on discharge May 22, 2024, and hospital course was complicated by frequent agitation, neurosurgery and neurology were consulted, neurology felt patient possibly suffering from Lewy body dementia and they initiated an atypical antipsychotic and he was discharged on Zyprexa .  .has DIABETES, TYPE 1; Hyperlipidemia; OBESITY; Generalized anxiety disorder; Chronic depression; OBSTRUCTIVE SLEEP APNEA; HYPERTENSION; G E R D; PULMONARY SARCOIDOSIS; COUGH; S/P cervical spinal fusion; Acute metabolic encephalopathy; Worsening short-term memory loss; Epidural hematoma (HCC); Chronic pain syndrome; Cervical spinal stenosis s/p fusion 5/7; History of gait instability; Hypothyroidism; Mild cognitive impairment; Insulin  dependent type 2 diabetes mellitus (HCC); History of choking; Altered mental status, unspecified; and DKA (diabetic ketoacidosis) (HCC) on their problem list.    - patient with a hx of cognitive impairment, tremor dxed as possible ET by neurology Dr. Evonnie 2019 but recently more parkinsonian such as shuffling gait, rem sleep disorder symptoms, possible diagnosis of Lewy body dementia/cognitive  impairment and family literally came straight to the office after discharge s/p ACDF 5/7 then readmission 5/13 for altered mental status, acute metabolic encephalopathy, delirium, delusions after surgery  while on pain medications at home.   - Neurology consulted inpatient and performed an evaluation, mri brain unremarkable for focal/acute event. EEG suggestive of moderate diffuse encephalopathy. No seizures or epileptiform discharges were seen throughout the recording. B12, tsh tested. 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   - He is parkinsonian on exam, at this time very difficult to assess after just having 2 recent admissions and coming straight from hospital admission to appointment. My recommendations were to continue Zyprexa  for agitation, discontinue any medications that patient does not need that may be additive to his confusion such as opioids and return at a later date. We did speak about possibility of more testing but at this time, after such recent admissions s/p surgery and complications, memory testing unreliable (MMSE today in office 15/30)   Cc: Nichole Senior, MD,  Nichole Senior, MD  Onetha Epp, MD  [facility] [address] [place], [place] [address]  Phone [phone] Fax [phone]  I spent over 60 minutes of face-to-face and non-face-to-face time with patient on the  No diagnosis found.  diagnosis.  This included previsit chart review, lab review, study review, order entry, electronic health record documentation, patient education on the different diagnostic and therapeutic options, counseling and coordination of care, risks and benefits of management, compliance, or risk factor reduction

## 2024-07-18 ENCOUNTER — Ambulatory Visit: Admitting: Neurology

## 2024-07-22 ENCOUNTER — Encounter: Payer: Self-pay | Admitting: Neurology

## 2024-07-22 ENCOUNTER — Ambulatory Visit (INDEPENDENT_AMBULATORY_CARE_PROVIDER_SITE_OTHER): Admitting: Neurology

## 2024-07-22 VITALS — BP 126/72 | HR 77 | Ht 69.0 in | Wt 215.0 lb

## 2024-07-22 DIAGNOSIS — G20C Parkinsonism, unspecified: Secondary | ICD-10-CM

## 2024-07-22 DIAGNOSIS — R413 Other amnesia: Secondary | ICD-10-CM

## 2024-07-22 DIAGNOSIS — G3184 Mild cognitive impairment, so stated: Secondary | ICD-10-CM | POA: Diagnosis not present

## 2024-07-22 DIAGNOSIS — Z820 Family history of epilepsy and other diseases of the nervous system: Secondary | ICD-10-CM

## 2024-07-22 DIAGNOSIS — E039 Hypothyroidism, unspecified: Secondary | ICD-10-CM

## 2024-07-22 DIAGNOSIS — G4719 Other hypersomnia: Secondary | ICD-10-CM | POA: Diagnosis not present

## 2024-07-22 DIAGNOSIS — G4733 Obstructive sleep apnea (adult) (pediatric): Secondary | ICD-10-CM

## 2024-07-22 NOTE — Patient Instructions (Addendum)
 Blood work (including alzheimer's biomarkers in the blood). If we find some alzheimer's biomarkers in the blood we can test with a CT PET Amyloid that looks for alzheimer's proteins in the brain Formal neurocognitive testing Can also consider a test for Lewy Body Dementia (A DAT Scan)  Recommend follow up on sleep apnea - will refer back   Problems With Thinking and Memory (Mild Neurocognitive Disorder): What to Know Mild neurocognitive disorder, formerly known as mild cognitive impairment, is a disorder where your memory doesn't work as well as it should. It may also affect other mental abilities like thinking, communicating, behavior, and being able to finish tasks. These problems can be noticed and measured. But they usually don't stop you from doing daily activities or living on your own. Mild neurocognitive disorder usually happens after 62 years of age. But it can also happen at younger ages. It's not as serious as major neurocognitive disorder, also known as dementia, but it may be the first sign of it. In general, the symptoms of this condition get worse over time. In rare cases, symptoms can get better. What are the causes? This condition may be caused by: Brain disorders like Alzheimer's disease, Parkinson's disease, and other conditions that slowly damage nerve cells. Diseases that affect the blood vessels in the brain and cause small strokes. Certain infections, like HIV. Traumatic brain injury. Other medical conditions, such as brain tumors, underactive thyroid (hypothyroidism), and not having enough vitamin B12. Using certain drugs or medicines. What increases the risk? Being older than 62 years of age. Being male. Having a lower level of education. Diabetes, high blood pressure, high cholesterol, and other conditions that raise the risk for blood vessel diseases. Untreated or undertreated sleep apnea. Having a certain type of gene that can be inherited, or passed down from  parent to child. Long-term health problems like heart disease, lung disease, liver disease, kidney disease, or depression. What are the signs or symptoms? Trouble remembering things. You may: Forget names, phone numbers, or details of recent events. Forget about social events and appointments. Often forget where you put your car keys or other items. Trouble thinking and solving problems. You may have trouble with complex tasks like: Paying bills. Driving in places you don't know well. Trouble communicating. You may have trouble: Finding the right word or naming an object. Forming a sentence that makes sense. Understanding what you read or hear. Changes in your behavior or personality. When this happens, you may: Lose interest in the things you used to enjoy. Avoid being around people. Get angry more easily than usual. Act before thinking. How is this diagnosed? This condition is diagnosed based on: Your symptoms. Your health care provider may ask you and the people you spend time with, like family and friends, about your symptoms. Memory tests and other tests to check how your brain is working. Your provider may refer you to a provider called a neurologist or a mental health specialist. To try to find out the cause of your condition, your provider may: Get a detailed medical history. Ask about use of alcohol, drugs, and medicines. Do a physical exam. Order blood tests and brain imaging tests. How is this treated? Mild neurocognitive disorder that's caused by medicine use, drug use, infection, or another medical condition may get better when the cause is treated, or when medicines or drugs are stopped. If this disorder has another cause, it usually doesn't improve and may get worse. In these cases, the goal of treatment  is to help you manage the symptoms. This may include: Medicines to help with memory and behavior symptoms. Talk therapy. This provides education, emotional support,  memory aids, and other ways of making up for problems with mental tasks. Lifestyle changes. These may include: Getting regular exercise. Eating a healthy diet that includes omega-3 fatty acids. Doing things to challenge your thinking and memory skills. Spending more time being with and talking to other people. Using routines like having regular times for meals and going to bed. Follow these instructions at home: Eating and drinking  Drink more fluids as told. Eat a healthy diet that includes omega-3 fatty acids. These can be found in: Fish. Nuts. Leafy vegetables. Vegetable oils. If you drink alcohol: Limit how much you have to: 0-1 drink a day if you're male. 0-2 drinks a day if you're male. Know how much alcohol is in your drink. In the [place] one drink is one 12 oz bottle of beer (355 mL), one 5 oz glass of wine (148 mL), or one 1 oz glass of hard liquor (44 mL). Lifestyle  Get regular exercise as told by your provider. Do not smoke, vape, or use nicotine or tobacco. Use healthy ways to manage stress. If you need help managing stress, ask your provider. Keep spending time with other people. Keep your mind active by doing activities you enjoy, like reading or playing games. Make sure you get good sleep at night. These tips can help: Try not to take naps during the day. Keep your bedroom dark and cool. Do not exercise in the few hours before you go to bed. Do not have foods or drinks with caffeine at night. General instructions Take medicines only as told. Your provider may tell you to avoid taking medicines that can affect thinking. These include some medicines for pain or sleeping. Work with your provider to find out: What things you need help with. What your safety needs are. Where to find more information [employer] on Aging: [email or web address] Contact a health care provider if: You have any new symptoms. Get help right away if: You have new confusion or your  confusion gets worse. You act in ways that put you or your family in danger. This information is not intended to replace advice given to you by your health care provider. Make sure you discuss any questions you have with your health care provider. Document Revised: 06/06/2023 Document Reviewed: 06/06/2023 Elsevier Patient Education  2024 [employer].

## 2024-07-22 NOTE — Progress Notes (Unsigned)
 [facility]    Provider:  Dr Ines Requesting Provider: Nichole Senior, MD Primary Care Provider:  Nichole Senior, MD  CC:  memory changes, just discharged from hospital  07/22/2024: he has no memory of the hospital. He is improved. They took him off of flexeril  and he took one and he had side effects so it convinced him that the flexeril  and pain medications really do cause symptoms. No hydrocodone , takes tramadol as needed minimally usually tylenol . Neck pain has improved. The delirium has resolved. Memory is better but not cured. The memory problems are still ongoing however at least where he was before his surgery, he still occassionally gets lost in the grocery store, olanzapine  at night is working but has gained weight, not acting out dreams anymore with medication management (stopping the amitriptyline ). He has untreated sleep apnea, would recommend retestingb 4-5 years agi.  Split Night Sleep Study 08/23/21: IMPRESSIONS - Moderate obstructive sleep apnea with an AHI of 24.4 and SpO2 low of 85%. - He did well with CPAP 12 cm H2O. - He did not require supplemental oxygen during this study.     Decent stride, slightly wide based. Hypomimia, decreased arm swing but much improved from initial  Not swinging   HPI:  Willie [name] is a 63 y.o. male here as requested by Nichole Senior, MD for memory changes. has DIABETES, TYPE 1; Hyperlipidemia; OBESITY; Generalized anxiety disorder; Chronic depression; OBSTRUCTIVE SLEEP APNEA; HYPERTENSION; G E R D; PULMONARY SARCOIDOSIS; COUGH; S/P cervical spinal fusion; Acute metabolic encephalopathy; Worsening short-term memory loss; Epidural hematoma (HCC); Chronic pain syndrome; Cervical spinal stenosis s/p fusion 5/7; History of gait instability; Hypothyroidism; Mild cognitive impairment; Insulin  dependent type 2 diabetes mellitus (HCC); History of choking; Altered mental status, unspecified; and DKA (diabetic ketoacidosis) (HCC) on  their problem list.  Patient and his family came directly from hospital for appointment for memory changes, report he had delirium while inpatient. Has a diagnosis of possibly Lewy Body Dementia/cognitive impairment. 4 years ago he started having memory problems. Her with sister in law and wife. Started with short-term memory loss. They have been married 41 years. It slowly progressed over the years. Since then he had REM sleep disorder and not using his cpap. He would forget wat kind of the week it is. Not driving for anymore. He had surgery for the acdf and he started hallucinating and saw visitors. The net couple days was difficult. He would slidw out and agitated.  Ativan  and haldol  made him agitated and pit him on medications. He falls asleep a lot. Difficulty with changing lights. He'll get in the shower and dress. They have a lot of stairs in the home. Developed a tremor. Diagnosed as essential tremor. Imbalance, shuffling. No history of hallucinations at home. Maybe some rem sleep disorder. Cutting the grass. Falls at home. Imbalance. He has been retired 10 years ago and he couldn;t remember names. He has a hard time time with the remote. Not a lot of going out, home bodies. More paranoid since the surgery.   Reviewed notes, labs and imaging from outside physicians, which showed: I reviewed notes from epic.  Patient was admitted on May 01, 2024 for cervical stenosis and taken to the operating room where he underwent ACDF C5-6.  Per Dr. Joshua notes on discharge he had tolerated the procedure well.  Dr. Pixie notes on May 07, 2024 from the emergency room patient was on muscle relaxants as well as pain medications oxycodone and since the surgery  having increasing confusion, delusions, hallucinations.  He was admitted for confusion, initial scan showed some epidural hematoma, on 520 due to worsening blood pressure was transferred to ICU and additionally suffering from DKA and also placed on insulin  drip,  labile blood sugars per Dr. Benn notes on discharge May 22, 2024, and hospital course was complicated by frequent agitation, neurosurgery and neurology were consulted, neurology felt patient possibly suffering from Lewy body dementia and they initiated an atypical antipsychotic and he was discharged on Zyprexa .  MRI Brain(reviewed notes): Motion degraded exam.  No acute intracranial abnormality.  Nonspecific lateral ventriculomegaly-otherwise negative for age noncontrast MRI of the brain.  Reviewed labs unremarkable tsh 5.207(f/u pcp), B12 elevated 2439,   EEG suggestive of moderate diffuse encephalopathy. No seizures or epileptiform discharges were seen throughout the recording.  Review of Systems: Patient complains of symptoms per HPI as well as the following symptoms confusion, agitation. Pertinent negatives and positives per HPI. All others negative.   Social History   Socioeconomic History   Marital status: Married    Spouse name: Not on file   Number of children: Not on file   Years of education: Not on file   Highest education level: Not on file  Occupational History   Occupation: disability    Comment: firefighter -   Tobacco Use   Smoking status: Never   Smokeless tobacco: Former    Quit date: 11/14/2003   Tobacco comments:    Pt quit dip in 2004  Vaping Use   Vaping status: Never Used  Substance and Sexual Activity   Alcohol use: Yes    Comment: occ   Drug use: Never   Sexual activity: Yes  Other Topics Concern   Not on file  Social History Narrative   Pt lives with wife    Retired    [employer] investment banker Strain: Not on file  Food Insecurity: No Food Insecurity (05/16/2024)   Hunger Vital Sign    Worried About Running Out of Food in the Last Year: Never true    Ran Out of Food in the Last Year: Never true  Transportation Needs: No Transportation Needs (05/16/2024)   PRAPARE - Administrator, Civil Service (Medical):  No    Lack of Transportation (Non-Medical): No  Physical Activity: Not on file  Stress: Not on file  Social Connections: Not on file  Intimate Partner Violence: Not At Risk (05/16/2024)   Humiliation, Afraid, Rape, and Kick questionnaire    Fear of Current or Ex-Partner: No    Emotionally Abused: No    Physically Abused: No    Sexually Abused: No    Family History  Problem Relation Age of Onset   Tremor Mother    Thyroid disease Mother    Dementia Mother    CAD Father    COPD Father    Other Brother        unknown   Healthy Son    Alzheimer's disease Neg Hx     Past Medical History:  Diagnosis Date   Anxiety    Arthritis    BPH (benign prostatic hyperplasia)    Depression    Diabetes (HCC)    Diabetic neuropathy (HCC)    Diabetic retinopathy (HCC)    Esophageal stricture    Fibromyalgia    GERD (gastroesophageal reflux disease)    History of kidney stones    Hyperlipidemia    Hypertension    Hypothyroidism    Melanoma (  HCC)    Nuclear sclerosis of both eyes    Obesity    OSA (obstructive sleep apnea)    Polyneuropathy    Refractive amblyopia of right eye    Tremor     Patient Active Problem List   Diagnosis Date Noted   Acute metabolic encephalopathy 05/08/2024   Worsening short-term memory loss 05/08/2024   Epidural hematoma (HCC) 05/08/2024   Chronic pain syndrome 05/08/2024   Cervical spinal stenosis s/p fusion 5/7 05/08/2024   History of gait instability 05/08/2024   Hypothyroidism 05/08/2024   Mild cognitive impairment 05/08/2024   Insulin  dependent type 2 diabetes mellitus (HCC) 05/08/2024   History of choking 05/08/2024   Altered mental status, unspecified 05/08/2024   DKA (diabetic ketoacidosis) (HCC) 05/08/2024   S/P cervical spinal fusion 05/01/2024   PULMONARY SARCOIDOSIS 12/30/2010   COUGH 12/30/2010   OBSTRUCTIVE SLEEP APNEA 05/16/2008   DIABETES, TYPE 1 05/15/2008   Hyperlipidemia 05/15/2008   OBESITY 05/15/2008   Generalized  anxiety disorder 05/15/2008   Chronic depression 05/15/2008   HYPERTENSION 05/15/2008   G E R D 05/15/2008    Past Surgical History:  Procedure Laterality Date   ANTERIOR CERVICAL DECOMP/DISCECTOMY FUSION N/A 05/01/2024   Procedure: ANTERIOR CERVICAL DECOMPRESSION/DISCECTOMY FUSION  Cervical Five-Cervical Six, remove tether plate Cervical Six-Seven;  Surgeon: Joshua Alm Hamilton, MD;  Location: [facility];  Service: Neurosurgery;  Laterality: N/A;  ACDF - C3-C4 - C4-C5 - C5-C6, remove tether plate R3-2   CARPAL TUNNEL RELEASE Bilateral 2002   CATARACT EXTRACTION Bilateral    2023   CERVICAL DISC SURGERY  2005   CYST EXCISION  2002   middle finger   CYST EXCISION  2007   left thigh   ESOPHAGEAL DILATION     MELANOMA EXCISION     2016 over chest   ROTATOR CUFF REPAIR Left    TRIGGER FINGER RELEASE Bilateral     Current Outpatient Medications  Medication Sig Dispense Refill   Ascorbic Acid (VITAMIN C) 1000 MG tablet Take 1,000 mg by mouth daily.     buPROPion  (WELLBUTRIN  SR) 150 MG 12 hr tablet Take 150 mg by mouth 2 (two) times daily.      busPIRone  (BUSPAR ) 10 MG tablet Take 10 mg by mouth 2 (two) times daily.     carvedilol  (COREG ) 12.5 MG tablet Take 1 tablet (12.5 mg total) by mouth 2 (two) times daily with a meal. 90 tablet 0   ezetimibe  (ZETIA ) 10 MG tablet Take 10 mg by mouth daily.     furosemide (LASIX) 20 MG tablet Take 20 mg by mouth 2 (two) times daily.     gabapentin  (NEURONTIN ) 300 MG capsule Take 300 mg by mouth 2 (two) times daily.     insulin  aspart (NOVOLOG ) 100 UNIT/ML injection Inject 7 Units into the skin 3 (three) times daily with meals. 10 mL 11   levothyroxine  (SYNTHROID ) 112 MCG tablet Take 112 mcg by mouth 2 (two) times daily.     OLANZapine  (ZYPREXA ) 10 MG tablet Take 1/2 tablet in the morning (5mg ) and a whole tablet at bedtime(10mg ) 45 tablet 6   omeprazole  (PRILOSEC) 40 MG capsule TAKE ONE CAPSULE BY MOUTH EVERY MORNING AND EVERY EVENING 60 capsule 11    rosuvastatin  (CRESTOR ) 10 MG tablet Take 10 mg by mouth 3 (three) times a week.     traMADol (ULTRAM) 50 MG tablet Take 50 mg by mouth 3 (three) times daily. (Patient taking differently: Take 50 mg by mouth as needed.)  cloNIDine  (CATAPRES  - DOSED IN MG/24 HR) 0.3 mg/24hr patch Place 1 patch (0.3 mg total) onto the skin once a week. 12 patch 0   HYDROcodone -acetaminophen  (NORCO) 10-325 MG tablet Take 1 tablet by mouth every 4 (four) hours as needed for moderate pain (pain score 4-6). 40 tablet 0   insulin  glargine-yfgn (SEMGLEE ) 100 UNIT/ML injection Inject 0.2 mLs (20 Units total) into the skin every 12 (twelve) hours. 10 mL 11   OLANZapine  zydis (ZYPREXA ) 5 MG disintegrating tablet Take 1 in the morning(5mg ) and 2 at bedtime(10mg ) 90 tablet 6   tiZANidine  (ZANAFLEX ) 4 MG tablet Take 1 tablet (4 mg total) by mouth every 8 (eight) hours as needed for muscle spasms. 90 tablet 1   No current facility-administered medications for this visit.    Allergies as of 07/22/2024 - Review Complete 07/22/2024  Allergen Reaction Noted   Other Other (See Comments), Shortness Of Breath, and Swelling 07/22/2019   Latex Rash 04/10/2024    Vitals: BP 126/72   Pulse 77   Ht 5' 9 (1.753 m)   Wt 215 lb (97.5 kg)   BMI 31.75 kg/m  Last Weight:  Wt Readings from Last 1 Encounters:  07/22/24 215 lb (97.5 kg)   Last Height:   Ht Readings from Last 1 Encounters:  07/22/24 5' 9 (1.753 m)     Physical exam: Exam: Gen: NAD                    CV: RRR, no MRG. No Carotid Bruits. Eyes: Conjunctivae clear without exudates or hemorrhage  Neuro: Detailed Neurologic Exam  Speech:    Speech appears without aphasia or dysarthria Cognition:    05/22/2024    1:45 PM  MMSE - Mini Mental State Exam  Orientation to time 2  Orientation to Place 4  Registration 3  Attention/ Calculation 0  Recall 0  Language- name 2 objects 2  Language- repeat 1  Language- follow 3 step command 2  Language- read &  follow direction 1  Write a sentence 0  Copy design 0  Total score 15      The pupils are equal, round, and reactive to light. The fundi are normal and spontaneous venous pulsations are present. Visual fields are full to finger confrontation. Extraocular movements are intact. Trigeminal sensation is intact and the muscles of mastication are normal. The face is symmetric. The palate elevates in the midline. Hearing intact. Voice is normal. Shoulder shrug is normal. The tongue has normal motion without fasciculations.   Coordination: Impaired   Gait: In wheelchair, difficulty walking brief walk showed shuffling gait  Motor Observation: Resting and postural tremor Tone: increased  Posture:    Posture is normal. normal erect    Strength:    Strength is equal and antigravity in the upper and lower limbs, difficult to perform a thorough examination at this time due to mentation     Sensation: intact to LT     Reflex Exam:  DTR's:    Deep tendon reflexes in the upper and lower extremities are symmetrical bilaterally.   Toes:    The toes are equiv bilaterally.   Clonus:    Clonus is absent.    Assessment/Plan:  Patient was admitted on May 01, 2024 for cervical stenosis and taken to the operating room where he underwent ACDF C5-6.  Per Dr. Joshua notes on discharge he had tolerated the procedure well.  Dr. Pixie notes on May 07, 2024 from the emergency room  patient was on muscle relaxants as well as pain medications oxycodone and since the surgery having increasing confusion, delusions, hallucinations.  He was admitted for confusion, initial scan showed some epidural hematoma, on 520 due to worsening blood pressure was transferred to ICU and additionally suffering from DKA a/nd also placed on insulin  drip, labile blood sugars per Dr. Benn notes on discharge May 22, 2024, and hospital course was complicated by frequent agitation, neurosurgery and neurology were consulted, neurology  felt patient possibly suffering from Lewy body dementia and they initiated an atypical antipsychotic and he was discharged on Zyprexa .  .has DIABETES, TYPE 1; Hyperlipidemia; OBESITY; Generalized anxiety disorder; Chronic depression; OBSTRUCTIVE SLEEP APNEA; HYPERTENSION; G E R D; PULMONARY SARCOIDOSIS; COUGH; S/P cervical spinal fusion; Acute metabolic encephalopathy; Worsening short-term memory loss; Epidural hematoma (HCC); Chronic pain syndrome; Cervical spinal stenosis s/p fusion 5/7; History of gait instability; Hypothyroidism; Mild cognitive impairment; Insulin  dependent type 2 diabetes mellitus (HCC); History of choking; Altered mental status, unspecified; and DKA (diabetic ketoacidosis) (HCC) on their problem list.   Recommend stop driving, discussed and he agrees he will not drive at all, cognitive decline, decreased reflexes, decreased range of motion (in the future can conside a driving test if insisted). Also discussed guns in the house, no guns in the house at all.But he had guns loaded and hidden  We discussed lewy body dementia. Formal neurocognitive testing  - patient with a hx of cognitive impairment, tremor dxed as possible ET by neurology Dr. Evonnie 2019 but recently more parkinsonian such as shuffling gait, rem sleep disorder symptoms, possible diagnosis of Lewy body dementia/cognitive impairment and family literally came straight to the office after discharge s/p ACDF 5/7 then readmission 5/13 for altered mental status, acute metabolic encephalopathy, delirium, delusions after surgery while on pain medications at home.   - Neurology consulted inpatient and performed an evaluation, mri brain unremarkable for focal/acute event. EEG suggestive of moderate diffuse encephalopathy. No seizures or epileptiform discharges were seen throughout the recording. B12, tsh tested. 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   - He is parkinsonian on exam, at this time very difficult to assess after just  having 2 recent admissions and coming straight from hospital admission to appointment. My recommendations were to continue Zyprexa  for agitation, discontinue any medications that patient does not need that may be additive to his confusion such as opioids and return at a later date. We did speak about possibility of more testing but at this time, after such recent admissions s/p surgery and complications, memory testing unreliable (MMSE today in office 15/30)   Cc: Nichole Senior, MD,  Nichole Senior, MD  Onetha Epp, MD  [facility] [address] [place], [place] [address]  Phone [phone] Fax [phone]  I spent over 60 minutes of face-to-face and non-face-to-face time with patient on the  No diagnosis found.  diagnosis.  This included previsit chart review, lab review, study review, order entry, electronic health record documentation, patient education on the different diagnostic and therapeutic options, counseling and coordination of care, risks and benefits of management, compliance, or risk factor reduction

## 2024-07-24 ENCOUNTER — Telehealth: Payer: Self-pay | Admitting: Neurology

## 2024-07-24 DIAGNOSIS — R413 Other amnesia: Secondary | ICD-10-CM

## 2024-07-24 NOTE — Telephone Encounter (Addendum)
 Referral for sleep studies to see Dr. Carolynne Allan sent through [facility] to [facility]. Phone: [phone], Fax: [phone]

## 2024-07-24 NOTE — Telephone Encounter (Signed)
 [employer] medicare shara: [id number] exp. 07/24/24-09/07/24 sent to [facility] [id number]

## 2024-07-24 NOTE — Telephone Encounter (Signed)
 Referral for neuropsychology fax as requested by neurologist to [facility] to see Dr. Andriette Renfroe. Phone: [phone], Fax: [phone]

## 2024-08-02 ENCOUNTER — Ambulatory Visit: Payer: Self-pay | Admitting: Neurology

## 2024-08-03 LAB — CREATININE, SERUM
Creatinine, Ser: 0.77 mg/dL (ref 0.76–1.27)
eGFR: 101 mL/min/1.73 (ref >59)

## 2024-08-03 LAB — BUN: BUN: 10 mg/dL (ref 8–27)

## 2024-08-03 LAB — ATN PROFILE
A -- Beta-amyloid 42/40 Ratio: 0.103 (ref >0.102)
Beta-amyloid 40: 239.79 pg/mL
Beta-amyloid 42: 24.62 pg/mL
N -- NfL, Plasma: 10.8 pg/mL — ABNORMAL HIGH (ref 0.00–3.65)
T -- p-tau181: 1.58 pg/mL — ABNORMAL HIGH (ref 0.00–0.97)

## 2024-08-03 LAB — APOE ALZHEIMER'S RISK

## 2024-08-03 LAB — VITAMIN B1: Thiamine: 161.9 nmol/L (ref 66.5–200.0)

## 2024-08-03 LAB — TSH RFX ON ABNORMAL TO FREE T4: TSH: 0.415 u[IU]/mL — ABNORMAL LOW (ref 0.450–4.500)

## 2024-08-03 LAB — T4F: T4,Free (Direct): 1.35 ng/dL (ref 0.82–1.77)

## 2024-08-08 NOTE — Telephone Encounter (Addendum)
 Received a fax message from [facility]: Thank you fokr yhour kind referral. Unfortunately, we were unable to schedule your patient.  Patient refused appointment for the following reason: Cost, we are out of network.  Please contact [facility] again if this patient would like to schedule with our office.  Where would you like to send referral? Would need a new referral due to original was to Dr. Authur

## 2024-08-08 NOTE — Addendum Note (Signed)
 Addended by: NEYSA NENA RAMAN on: 08/08/2024 03:53 PM   Modules accepted: Orders

## 2024-08-12 ENCOUNTER — Telehealth: Payer: Self-pay | Admitting: Neurology

## 2024-08-12 NOTE — Addendum Note (Signed)
 Addended by: Delshon Blanchfield B on: 08/12/2024 02:54 PM   Modules accepted: Orders

## 2024-08-12 NOTE — Telephone Encounter (Signed)
 Referral for Neuropsychology sent thru epic to [facility]  [facility] Phone: [phone]

## 2024-08-12 NOTE — Telephone Encounter (Signed)
 Thank you referred to the new psychologist here at [facility]

## 2024-08-13 ENCOUNTER — Encounter (HOSPITAL_COMMUNITY): Admission: RE | Admit: 2024-08-13 | Source: Ambulatory Visit

## 2024-08-13 ENCOUNTER — Encounter (HOSPITAL_COMMUNITY)

## 2024-08-14 ENCOUNTER — Encounter (HOSPITAL_COMMUNITY): Admission: RE | Admit: 2024-08-14 | Source: Ambulatory Visit

## 2024-08-14 ENCOUNTER — Other Ambulatory Visit (HOSPITAL_COMMUNITY)

## 2024-08-16 ENCOUNTER — Encounter (HOSPITAL_COMMUNITY)

## 2024-08-22 ENCOUNTER — Encounter (HOSPITAL_COMMUNITY)
Admission: RE | Admit: 2024-08-22 | Discharge: 2024-08-22 | Disposition: A | Discharge status: Home / Self Care | Source: Ambulatory Visit | Attending: Neurology | Admitting: Neurology

## 2024-08-22 DIAGNOSIS — G20C Parkinsonism, unspecified: Secondary | ICD-10-CM | POA: Diagnosis present

## 2024-08-22 MED ORDER — IOFLUPANE I 123 185 MBQ/2.5ML IV SOLN
4.7700 | Freq: Once | INTRAVENOUS | Status: AC | PRN
  Administered 2024-08-22: 4.77 via INTRAVENOUS
  Filled 2024-08-22: qty 5

## 2024-08-22 MED ORDER — POTASSIUM IODIDE (ANTIDOTE) 130 MG PO TABS
ORAL_TABLET | ORAL | Status: AC
  Filled 2024-08-22: qty 1

## 2024-09-03 ENCOUNTER — Encounter: Payer: Self-pay | Admitting: Neurology

## 2024-09-04 NOTE — Telephone Encounter (Signed)
 Great, thanks!

## 2024-09-13 ENCOUNTER — Encounter: Payer: Self-pay | Admitting: Psychology

## 2024-10-03 ENCOUNTER — Telehealth: Payer: Self-pay | Admitting: Neurology

## 2024-10-03 ENCOUNTER — Ambulatory Visit: Admitting: Emergency Medicine

## 2024-10-03 NOTE — Telephone Encounter (Signed)
 Called and left voicemail for patient to reschedule appointment on 01/22/25 with Dr Ines.  If patient calls back, they can be rescheduled with Dr Buck

## 2024-10-14 ENCOUNTER — Other Ambulatory Visit (HOSPITAL_BASED_OUTPATIENT_CLINIC_OR_DEPARTMENT_OTHER): Payer: Self-pay

## 2024-10-23 ENCOUNTER — Ambulatory Visit: Admitting: Emergency Medicine

## 2024-10-23 ENCOUNTER — Encounter: Payer: Self-pay | Admitting: Emergency Medicine

## 2024-10-23 VITALS — BP 125/78 | HR 82 | Temp 97.8°F | Ht 70.0 in | Wt 228.6 lb

## 2024-10-23 DIAGNOSIS — G4733 Obstructive sleep apnea (adult) (pediatric): Secondary | ICD-10-CM | POA: Diagnosis not present

## 2024-10-23 DIAGNOSIS — I2729 Other secondary pulmonary hypertension: Secondary | ICD-10-CM

## 2024-10-23 DIAGNOSIS — D869 Sarcoidosis, unspecified: Secondary | ICD-10-CM

## 2024-10-23 DIAGNOSIS — R600 Localized edema: Secondary | ICD-10-CM | POA: Diagnosis not present

## 2024-10-23 NOTE — Progress Notes (Signed)
 Subjective:    Patient ID: Willie [name], male    DOB: 02/15/1961, 63 y.o.   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  Also with a history of hypertension, diabetes, OSA on CPAP, GERD.  We have been working to try to get him new CPAP but has been having trouble, received the wrong headgear, equipment. Poor service from [employer], needs a larger size nasal pillows.  Today he reports that he feels better, is doing well.  At our last visit I tried starting albuterol  as needed to see if he would get benefit.  He reports that he has benefited from it - has used it for chest tightness in the evening,   Remains on omeprazole  40 mg twice daily We started loratadine  10 mg once daily at his last visit. He is benefiting   New patient visit to reestablish care 10/23/2024 --  [name] is a 63, a never smoker whom I have seen remotely for sarcoidosis established by endobronchial ultrasound with associated restrictive lung disease.  His spirometry has also shown possible superimposed mild obstruction.  He has obstructive sleep apnea and had been treated with CPAP.  Also Lewy body dementia/cognitive impairment with memory problems, hypertension, GERD, diabetes, chronic rhinitis.  He has been off CPAP for a few years now. He has more labored breathing, often at night. He sleeps often in a recliner. He snores some. No witnessed apneas at home, but this was observed when he was admitted to the hospital since I last saw him. He is dealing with some increased ankle edema. Denies any daytime SOB but he admits to a sedentary lifestyle.   CXR 05/22/24 reviewed by me showed clear lungs without any infiltrates or effusion.   Review of Systems As per HPI  Past Medical History:  Diagnosis Date   Anxiety    Arthritis    BPH (benign prostatic hyperplasia)    Depression    Diabetes  (HCC)    Diabetic neuropathy (HCC)    Diabetic retinopathy (HCC)    Esophageal stricture    Fibromyalgia    GERD (gastroesophageal reflux disease)    History of kidney stones    Hyperlipidemia    Hypertension    Hypothyroidism    Melanoma (HCC)    Nuclear sclerosis of both eyes    Obesity    OSA (obstructive sleep apnea)    Polyneuropathy    Refractive amblyopia of right eye    Tremor      Family History  Problem Relation Age of Onset   Tremor Mother    Thyroid disease Mother    Dementia Mother    CAD Father    COPD Father    Other Brother        unknown   Healthy Son    Alzheimer's disease Neg Hx      Social History   Socioeconomic History   Marital status: Married    Spouse name: Not on file   Number of children: Not on file   Years of education: Not on file   Highest education level: Not on file  Occupational History   Occupation: disability    Comment: firefighter -   Tobacco Use   Smoking status: Never   Smokeless tobacco: Former    Quit date: 11/14/2003   Tobacco comments:    Pt quit dip in 2004  Vaping Use   Vaping status:  Never Used  Substance and Sexual Activity   Alcohol use: Yes    Comment: occ   Drug use: Never   Sexual activity: Yes  Other Topics Concern   Not on file  Social History Narrative   Pt lives with wife    Retired    Chief Executive Officer [employer] Strain: Not on file  Food Insecurity: No Food Insecurity (05/16/2024)   Hunger Vital Sign    Worried About Running Out of Food in the Last Year: Never true    Ran Out of Food in the Last Year: Never true  Transportation Needs: No Transportation Needs (05/16/2024)   PRAPARE - Administrator, Civil Service (Medical): No    Lack of Transportation (Non-Medical): No  Physical Activity: Not on file  Stress: Not on file  Social Connections: Not on file  Intimate Partner Violence: Not At Risk (05/16/2024)   Humiliation, Afraid, Rape, and Kick questionnaire     Fear of Current or Ex-Partner: No    Emotionally Abused: No    Physically Abused: No    Sexually Abused: No     Allergies  Allergen Reactions   Other Other (See Comments), Shortness Of Breath and Swelling    Flu vaccine   Latex Rash    Adhesive      Outpatient Medications Prior to Visit  Medication Sig Dispense Refill   Ascorbic Acid (VITAMIN C) 1000 MG tablet Take 1,000 mg by mouth daily.     buPROPion  (WELLBUTRIN  SR) 150 MG 12 hr tablet Take 150 mg by mouth 2 (two) times daily.      busPIRone  (BUSPAR ) 10 MG tablet Take 10 mg by mouth 2 (two) times daily.     carvedilol  (COREG ) 12.5 MG tablet Take 1 tablet (12.5 mg total) by mouth 2 (two) times daily with a meal. 90 tablet 0   cloNIDine  (CATAPRES  - DOSED IN MG/24 HR) 0.3 mg/24hr patch Place 1 patch (0.3 mg total) onto the skin once a week. 12 patch 0   ezetimibe  (ZETIA ) 10 MG tablet Take 10 mg by mouth daily.     furosemide (LASIX) 20 MG tablet Take 20 mg by mouth 2 (two) times daily.     gabapentin  (NEURONTIN ) 300 MG capsule Take 300 mg by mouth 2 (two) times daily.     HYDROcodone -acetaminophen  (NORCO) 10-325 MG tablet Take 1 tablet by mouth every 4 (four) hours as needed for moderate pain (pain score 4-6). 40 tablet 0   insulin  aspart (NOVOLOG ) 100 UNIT/ML injection Inject 7 Units into the skin 3 (three) times daily with meals. 10 mL 11   insulin  glargine-yfgn (SEMGLEE ) 100 UNIT/ML injection Inject 0.2 mLs (20 Units total) into the skin every 12 (twelve) hours. 10 mL 11   levothyroxine  (SYNTHROID ) 112 MCG tablet Take 112 mcg by mouth 2 (two) times daily.     OLANZapine  (ZYPREXA ) 10 MG tablet Take 1/2 tablet in the morning (5mg ) and a whole tablet at bedtime(10mg ) 45 tablet 6   OLANZapine  zydis (ZYPREXA ) 5 MG disintegrating tablet Take 1 in the morning(5mg ) and 2 at bedtime(10mg ) 90 tablet 6   omeprazole  (PRILOSEC) 40 MG capsule TAKE ONE CAPSULE BY MOUTH EVERY MORNING AND EVERY EVENING 60 capsule 11   rosuvastatin  (CRESTOR ) 10 MG  tablet Take 10 mg by mouth 3 (three) times a week.     tiZANidine  (ZANAFLEX ) 4 MG tablet Take 1 tablet (4 mg total) by mouth every 8 (eight) hours as needed for muscle  spasms. 90 tablet 1   traMADol (ULTRAM) 50 MG tablet Take 50 mg by mouth 3 (three) times daily. (Patient taking differently: Take 50 mg by mouth as needed.)     No facility-administered medications prior to visit.         Objective:   Physical Exam  Vitals:   10/23/24 0925 10/23/24 1013  BP: (!) 160/73 125/78  Pulse: 82   Temp: 97.8 F (36.6 C)   TempSrc: Oral   SpO2: 95%   Weight: 228 lb 9.6 oz (103.7 kg)   Height: 5' 10 (1.778 m)    Gen: Pleasant, overwt man, in no distress,  normal affect  ENT: No lesions,  mouth clear,  oropharynx clear, narrow posterior pharynx, no postnasal drip  Neck: No JVD, no stridor  Lungs: No use of accessory muscles, no crackles or wheezing on normal respiration, no wheeze on forced expiration  Cardiovascular: RRR, heart sounds normal, no murmur or gallops, no peripheral edema  Musculoskeletal: No deformities, no cyanosis or clubbing  Neuro: alert, awake, non focal  Skin: Warm, no lesions or rash      Assessment & Plan:  OBSTRUCTIVE SLEEP APNEA Agree that we need to work on getting you back on CPAP.  Will need to order a home sleep test to establish that you have active sleep apnea.  Once we review this we will order CPAP for you.  PULMONARY SARCOIDOSIS We will perform a repeat CT scan of your chest to follow your sarcoidosis. We will probably arrange for pulmonary function testing at some point going forward.  We can discuss the timing of this at your next visit.  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  Will work to correct nocturnal hypoxemia, get him back on CPAP.  He will need a home sleep study first as above.  Check echocardiogram to assess pulmonary pressures.     Lamar Chris, MD,  PhD 10/23/2024, 5:18 PM [facility] [phone] or if no answer [phone]

## 2024-10-23 NOTE — Patient Instructions (Signed)
 It is good to see you today. Agree that we need to work on getting you back on CPAP.  Will need to order a home sleep test to establish that you have active sleep apnea.  Once we review this we will order CPAP for you. We will perform an echocardiogram We will perform a repeat CT scan of your chest to follow your sarcoidosis. We will probably arrange for pulmonary function testing at some point going forward.  We can discuss the timing of this at your next visit. Follow in our office in about 2 months so we can review your testing and plan next apps.

## 2024-10-23 NOTE — Assessment & Plan Note (Signed)
 Agree that we need to work on getting you back on CPAP.  Will need to order a home sleep test to establish that you have active sleep apnea.  Once we review this we will order CPAP for you.

## 2024-10-23 NOTE — Assessment & Plan Note (Signed)
 Suspect due to secondary pulmonary hypertension in the setting of untreated OSA.  Will work to correct nocturnal hypoxemia, get him back on CPAP.  He will need a home sleep study first as above.  Check echocardiogram to assess pulmonary pressures.

## 2024-10-23 NOTE — Assessment & Plan Note (Signed)
 We will perform a repeat CT scan of your chest to follow your sarcoidosis. We will probably arrange for pulmonary function testing at some point going forward.  We can discuss the timing of this at your next visit.

## 2024-10-28 ENCOUNTER — Ambulatory Visit
Admission: RE | Admit: 2024-10-28 | Discharge: 2024-10-28 | Disposition: A | Source: Ambulatory Visit | Attending: Emergency Medicine | Admitting: Emergency Medicine

## 2024-10-28 DIAGNOSIS — D869 Sarcoidosis, unspecified: Secondary | ICD-10-CM

## 2024-10-28 MED ORDER — IOPAMIDOL (ISOVUE-300) INJECTION 61%
75.0000 mL | Freq: Once | INTRAVENOUS | Status: AC | PRN
  Administered 2024-10-28: 75 mL via INTRAVENOUS

## 2024-10-30 ENCOUNTER — Ambulatory Visit (HOSPITAL_COMMUNITY)
Admission: RE | Admit: 2024-10-30 | Discharge: 2024-10-30 | Disposition: A | Discharge status: Home / Self Care | Source: Ambulatory Visit | Attending: Cardiology | Admitting: Cardiology

## 2024-10-30 DIAGNOSIS — I2729 Other secondary pulmonary hypertension: Secondary | ICD-10-CM | POA: Diagnosis present

## 2024-10-30 LAB — ECHOCARDIOGRAM COMPLETE
Area-P 1/2: 5.23 cm2
Calc EF: 55.3 %
S' Lateral: 3.2 cm
Single Plane A2C EF: 54.5 %
Single Plane A4C EF: 55.2 %

## 2024-11-06 ENCOUNTER — Other Ambulatory Visit

## 2024-11-10 ENCOUNTER — Encounter

## 2024-11-10 DIAGNOSIS — G4733 Obstructive sleep apnea (adult) (pediatric): Secondary | ICD-10-CM

## 2024-11-18 ENCOUNTER — Telehealth: Payer: Self-pay | Admitting: Pulmonary Disease

## 2024-11-18 DIAGNOSIS — G4733 Obstructive sleep apnea (adult) (pediatric): Secondary | ICD-10-CM | POA: Diagnosis not present

## 2024-11-18 NOTE — Telephone Encounter (Signed)
 Call patient  Sleep study result  Date of study: 11/10/2024  Impression: Mild obstructive sleep apnea with mild oxygen desaturations AHI of 5.8 with O2 nadir of 91%  Recommendation: Options for treating mild obstructive sleep apnea may include CPAP therapy if there are significant daytime symptoms or notable comorbidities. Auto CPAP 5-15 with heated humidification and the patient's preferred mask may be considered; other treatment options may include an oral device, watchful waiting with significant weight loss efforts.

## 2024-11-20 NOTE — Telephone Encounter (Signed)
 Called the pt and there was no answer- LMTCB

## 2024-11-20 NOTE — Telephone Encounter (Signed)
 Copied from CRM [id number]. Topic: Clinical - Lab/Test Results >> Nov 20, 2024  3:37 PM Isabell A wrote: Reason for CRM: Spouse returning phone call from [place] for sleep study results.   Callback number: [phone]  Called and spoke with the pts wife and she explained she would like to call back later as she is going into the grocery store right now.  Will await call back.

## 2024-11-25 NOTE — Telephone Encounter (Signed)
 Please at the patient know that his sleep study does confirm that he still has mild obstructive sleep apnea.  If he is willing to retry CPAP then okay to order CPAP 5-20 cmH2O AutoSet, heated humidity, best fit mask

## 2024-11-26 ENCOUNTER — Telehealth: Payer: Self-pay

## 2024-11-26 NOTE — Telephone Encounter (Signed)
 Copied from CRM [id number]. Topic: Clinical - Lab/Test Results >> Nov 20, 2024  3:37 PM Isabell A wrote: Reason for CRM: Spouse returning phone call from [place] for sleep study results.   Callback number: [phone] >> Nov 20, 2024  4:28 PM Rozanna MATSU wrote: The pt spouse returning clinic call, but call keeps dropping when I attempt to call and get back to pt. She stated she is ok with gettimg a call back on Monday    Tried to reach  out to patient to go over results VM/LM return call

## 2024-11-28 ENCOUNTER — Telehealth: Payer: Self-pay

## 2024-11-28 ENCOUNTER — Telehealth: Payer: Self-pay | Admitting: *Deleted

## 2024-11-28 DIAGNOSIS — G4733 Obstructive sleep apnea (adult) (pediatric): Secondary | ICD-10-CM

## 2024-11-28 NOTE — Telephone Encounter (Signed)
 Copied from CRM #[id number]. Topic: Clinical - Lab/Test Results >> Nov 26, 2024  3:45 PM Devaughn RAMAN wrote: Reason for CRM: Pt wife Adrien returning [place] phone call regarding pt's sleep study results, contacted [facility] no answer. Please f/u with pt.   duplicate

## 2024-11-28 NOTE — Telephone Encounter (Signed)
 Copied from CRM [id number]. Topic: Clinical - Lab/Test Results >> Nov 28, 2024 11:57 AM Devaughn RAMAN wrote: Reason for CRM: Pt's wife Adrien is returning a call regarding sleep study results for the pt.  I called and spoke to pt's wife, Adrien. DPR.   Debra informed of Sleep study results and okayed for pt to start treatment. I will order through [facility].   Adrien would also like to know the results of the ECHO and CT scan. Routing to Dr Shelah to just advise those test results.

## 2024-11-29 NOTE — Telephone Encounter (Signed)
 Called and spoke to pt's wife Dominyck Reser [facility] and advised of results per Dr. Shelah. Pt's wife verbalized understanding and expressed that they would like to proceed with CPAP therapy. CPAP order has been placed, NF[facility].

## 2024-12-04 NOTE — Telephone Encounter (Signed)
 His echocardiogram did not show any evidence for pulmonary hypertension or impact of lung disease on heart function.  This was good news.  His CT scan of the chest did not show any evidence of active sarcoid in the chest.  We can review in more detail when he follows up in the office.

## 2024-12-05 NOTE — Telephone Encounter (Signed)
 I called and spoke with patient/wife Willie [name]) on DPR.  I provided results per Dr. Shelah.  They verified understanding.  He has f/u with Dr. Shelah on 01/02/25.  Nothing further needed.

## 2025-01-02 ENCOUNTER — Ambulatory Visit: Admitting: Emergency Medicine

## 2025-01-10 ENCOUNTER — Ambulatory Visit: Payer: Self-pay | Admitting: Emergency Medicine

## 2025-01-13 NOTE — Telephone Encounter (Signed)
 X1 VM/ LM return call

## 2025-01-13 NOTE — Telephone Encounter (Signed)
 Spoke with patient and wife VBU, been sick with flu will rescheduled F/u after they get the Cpap machine

## 2025-01-22 ENCOUNTER — Ambulatory Visit: Admitting: Neurology

## 2025-01-27 ENCOUNTER — Encounter: Admitting: Psychology
